# Patient Record
Sex: Female | Born: 1940 | Race: White | Hispanic: No | Marital: Married | Smoking: Never smoker
Health system: Southern US, Community
[De-identification: ages and names within clinical notes are randomized; demographics above are authoritative.]

## PROBLEM LIST (undated history)

## (undated) DIAGNOSIS — G510 Bell's palsy: Secondary | ICD-10-CM

## (undated) DIAGNOSIS — E559 Vitamin D deficiency, unspecified: Secondary | ICD-10-CM

## (undated) DIAGNOSIS — R269 Unspecified abnormalities of gait and mobility: Secondary | ICD-10-CM

## (undated) DIAGNOSIS — Z8679 Personal history of other diseases of the circulatory system: Secondary | ICD-10-CM

## (undated) DIAGNOSIS — R7301 Impaired fasting glucose: Secondary | ICD-10-CM

## (undated) DIAGNOSIS — E538 Deficiency of other specified B group vitamins: Secondary | ICD-10-CM

## (undated) DIAGNOSIS — C9110 Chronic lymphocytic leukemia of B-cell type not having achieved remission: Secondary | ICD-10-CM

## (undated) DIAGNOSIS — I4891 Unspecified atrial fibrillation: Secondary | ICD-10-CM

## (undated) DIAGNOSIS — D801 Nonfamilial hypogammaglobulinemia: Secondary | ICD-10-CM

## (undated) DIAGNOSIS — G600 Hereditary motor and sensory neuropathy: Secondary | ICD-10-CM

## (undated) DIAGNOSIS — K8020 Calculus of gallbladder without cholecystitis without obstruction: Secondary | ICD-10-CM

## (undated) DIAGNOSIS — G35 Multiple sclerosis: Secondary | ICD-10-CM

## (undated) DIAGNOSIS — M152 Bouchard's nodes (with arthropathy): Secondary | ICD-10-CM

## (undated) DIAGNOSIS — G609 Hereditary and idiopathic neuropathy, unspecified: Secondary | ICD-10-CM

## (undated) DIAGNOSIS — W57XXXA Bitten or stung by nonvenomous insect and other nonvenomous arthropods, initial encounter: Secondary | ICD-10-CM

## (undated) HISTORY — DX: Chronic lymphocytic leukemia of B-cell type not having achieved remission: C91.10

## (undated) HISTORY — DX: Personal history of other diseases of the circulatory system: Z86.79

## (undated) HISTORY — DX: Bouchard's nodes (with arthropathy): M15.2

## (undated) HISTORY — DX: Bitten or stung by nonvenomous insect and other nonvenomous arthropods, initial encounter: W57.XXXA

## (undated) HISTORY — PX: CATARACT EXTRACTION W/ INTRAOCULAR LENS  IMPLANT, BILATERAL: SHX1307

## (undated) HISTORY — DX: Nonfamilial hypogammaglobulinemia: D80.1

## (undated) HISTORY — DX: Hereditary and idiopathic neuropathy, unspecified: G60.9

## (undated) HISTORY — DX: Deficiency of other specified B group vitamins: E53.8

## (undated) HISTORY — DX: Calculus of gallbladder without cholecystitis without obstruction: K80.20

## (undated) HISTORY — DX: Unspecified abnormalities of gait and mobility: R26.9

## (undated) HISTORY — DX: Multiple sclerosis: G35

## (undated) HISTORY — PX: BREAST SURGERY: SHX581

## (undated) HISTORY — DX: Bell's palsy: G51.0

## (undated) HISTORY — DX: Vitamin D deficiency, unspecified: E55.9

## (undated) HISTORY — DX: Unspecified atrial fibrillation: I48.91

## (undated) HISTORY — DX: Impaired fasting glucose: R73.01

---

## 1982-01-19 HISTORY — PX: ABDOMINAL HYSTERECTOMY: SUR658

## 1997-11-20 ENCOUNTER — Other Ambulatory Visit: Admission: RE | Admit: 1997-11-20 | Discharge: 1997-11-20 | Payer: Self-pay | Admitting: Internal Medicine

## 1999-07-22 ENCOUNTER — Other Ambulatory Visit: Admission: RE | Admit: 1999-07-22 | Discharge: 1999-07-22 | Payer: Self-pay | Admitting: Internal Medicine

## 1999-08-26 ENCOUNTER — Other Ambulatory Visit: Admission: RE | Admit: 1999-08-26 | Discharge: 1999-08-26 | Payer: Self-pay | Admitting: Internal Medicine

## 2000-04-15 ENCOUNTER — Emergency Department (HOSPITAL_COMMUNITY): Admission: EM | Admit: 2000-04-15 | Discharge: 2000-04-15 | Payer: Self-pay | Admitting: Emergency Medicine

## 2001-01-19 HISTORY — PX: LAPAROSCOPIC BILATERAL SALPINGO OOPHERECTOMY: SHX5890

## 2001-03-28 ENCOUNTER — Encounter: Payer: Self-pay | Admitting: Family Medicine

## 2001-03-28 ENCOUNTER — Ambulatory Visit (HOSPITAL_COMMUNITY): Admission: RE | Admit: 2001-03-28 | Discharge: 2001-03-28 | Payer: Self-pay | Admitting: Family Medicine

## 2001-04-12 ENCOUNTER — Encounter: Payer: Self-pay | Admitting: Family Medicine

## 2001-04-12 ENCOUNTER — Encounter: Admission: RE | Admit: 2001-04-12 | Discharge: 2001-04-12 | Payer: Self-pay | Admitting: Family Medicine

## 2003-08-05 ENCOUNTER — Emergency Department (HOSPITAL_COMMUNITY): Admission: EM | Admit: 2003-08-05 | Discharge: 2003-08-05 | Payer: Self-pay | Admitting: Emergency Medicine

## 2003-09-17 ENCOUNTER — Emergency Department (HOSPITAL_COMMUNITY): Admission: EM | Admit: 2003-09-17 | Discharge: 2003-09-17 | Payer: Self-pay | Admitting: Emergency Medicine

## 2008-02-28 ENCOUNTER — Encounter: Admission: RE | Admit: 2008-02-28 | Discharge: 2008-02-28 | Payer: Self-pay | Admitting: Neurology

## 2009-05-13 ENCOUNTER — Emergency Department (HOSPITAL_COMMUNITY): Admission: EM | Admit: 2009-05-13 | Discharge: 2009-05-13 | Payer: Self-pay | Admitting: Emergency Medicine

## 2010-04-08 LAB — DIFFERENTIAL
Basophils Relative: 1 % (ref 0–1)
Eosinophils Absolute: 0.1 10*3/uL (ref 0.0–0.7)
Eosinophils Relative: 3 % (ref 0–5)
Lymphocytes Relative: 39 % (ref 12–46)
Monocytes Absolute: 0.5 10*3/uL (ref 0.1–1.0)
Monocytes Relative: 9 % (ref 3–12)
Neutro Abs: 2.5 10*3/uL (ref 1.7–7.7)

## 2010-04-08 LAB — URINE CULTURE

## 2010-04-08 LAB — COMPREHENSIVE METABOLIC PANEL
AST: 14 U/L (ref 0–37)
Albumin: 3.6 g/dL (ref 3.5–5.2)
Alkaline Phosphatase: 59 U/L (ref 39–117)
CO2: 26 mEq/L (ref 19–32)
Calcium: 8.8 mg/dL (ref 8.4–10.5)
Creatinine, Ser: 0.71 mg/dL (ref 0.4–1.2)
GFR calc Af Amer: >60 mL/min (ref >60)
GFR calc non Af Amer: >60 mL/min (ref >60)
Potassium: 4 mEq/L (ref 3.5–5.1)

## 2010-04-08 LAB — URINALYSIS, ROUTINE W REFLEX MICROSCOPIC
Hgb urine dipstick: NEGATIVE
Nitrite: NEGATIVE
Specific Gravity, Urine: 1.009 (ref 1.005–1.030)

## 2010-04-08 LAB — URINE MICROSCOPIC-ADD ON

## 2010-04-08 LAB — CBC
RBC: 4.4 MIL/uL (ref 3.87–5.11)
RDW: 13.9 % (ref 11.5–15.5)

## 2011-01-29 DIAGNOSIS — Z1231 Encounter for screening mammogram for malignant neoplasm of breast: Secondary | ICD-10-CM | POA: Diagnosis not present

## 2011-02-04 DIAGNOSIS — R920 Mammographic microcalcification found on diagnostic imaging of breast: Secondary | ICD-10-CM | POA: Diagnosis not present

## 2011-02-11 DIAGNOSIS — G35 Multiple sclerosis: Secondary | ICD-10-CM | POA: Diagnosis not present

## 2011-02-11 DIAGNOSIS — H35039 Hypertensive retinopathy, unspecified eye: Secondary | ICD-10-CM | POA: Diagnosis not present

## 2011-03-16 DIAGNOSIS — Z79899 Other long term (current) drug therapy: Secondary | ICD-10-CM | POA: Diagnosis not present

## 2011-03-16 DIAGNOSIS — C9110 Chronic lymphocytic leukemia of B-cell type not having achieved remission: Secondary | ICD-10-CM | POA: Diagnosis not present

## 2011-03-16 DIAGNOSIS — G5790 Unspecified mononeuropathy of unspecified lower limb: Secondary | ICD-10-CM | POA: Diagnosis not present

## 2011-03-16 DIAGNOSIS — G35 Multiple sclerosis: Secondary | ICD-10-CM | POA: Diagnosis not present

## 2011-03-19 DIAGNOSIS — G609 Hereditary and idiopathic neuropathy, unspecified: Secondary | ICD-10-CM | POA: Diagnosis not present

## 2011-03-23 DIAGNOSIS — I4891 Unspecified atrial fibrillation: Secondary | ICD-10-CM | POA: Diagnosis not present

## 2011-03-26 DIAGNOSIS — R7301 Impaired fasting glucose: Secondary | ICD-10-CM | POA: Diagnosis not present

## 2011-03-26 DIAGNOSIS — M899 Disorder of bone, unspecified: Secondary | ICD-10-CM | POA: Diagnosis not present

## 2011-03-26 DIAGNOSIS — Z1331 Encounter for screening for depression: Secondary | ICD-10-CM | POA: Diagnosis not present

## 2011-03-26 DIAGNOSIS — I4891 Unspecified atrial fibrillation: Secondary | ICD-10-CM | POA: Diagnosis not present

## 2011-03-26 DIAGNOSIS — M949 Disorder of cartilage, unspecified: Secondary | ICD-10-CM | POA: Diagnosis not present

## 2011-03-26 DIAGNOSIS — Z7901 Long term (current) use of anticoagulants: Secondary | ICD-10-CM | POA: Diagnosis not present

## 2011-03-30 DIAGNOSIS — Z7901 Long term (current) use of anticoagulants: Secondary | ICD-10-CM | POA: Diagnosis not present

## 2011-04-01 DIAGNOSIS — Z7901 Long term (current) use of anticoagulants: Secondary | ICD-10-CM | POA: Diagnosis not present

## 2011-04-03 DIAGNOSIS — Z7901 Long term (current) use of anticoagulants: Secondary | ICD-10-CM | POA: Diagnosis not present

## 2011-04-06 DIAGNOSIS — Z7901 Long term (current) use of anticoagulants: Secondary | ICD-10-CM | POA: Diagnosis not present

## 2011-04-13 DIAGNOSIS — Z7901 Long term (current) use of anticoagulants: Secondary | ICD-10-CM | POA: Diagnosis not present

## 2011-04-20 DIAGNOSIS — Z7901 Long term (current) use of anticoagulants: Secondary | ICD-10-CM | POA: Diagnosis not present

## 2011-05-04 DIAGNOSIS — Z7901 Long term (current) use of anticoagulants: Secondary | ICD-10-CM | POA: Diagnosis not present

## 2011-06-01 DIAGNOSIS — Z7901 Long term (current) use of anticoagulants: Secondary | ICD-10-CM | POA: Diagnosis not present

## 2011-06-22 DIAGNOSIS — I4891 Unspecified atrial fibrillation: Secondary | ICD-10-CM | POA: Diagnosis not present

## 2011-06-29 DIAGNOSIS — Z7901 Long term (current) use of anticoagulants: Secondary | ICD-10-CM | POA: Diagnosis not present

## 2011-07-21 DIAGNOSIS — I4891 Unspecified atrial fibrillation: Secondary | ICD-10-CM | POA: Diagnosis not present

## 2011-07-27 DIAGNOSIS — Z7901 Long term (current) use of anticoagulants: Secondary | ICD-10-CM | POA: Diagnosis not present

## 2011-08-13 DIAGNOSIS — R920 Mammographic microcalcification found on diagnostic imaging of breast: Secondary | ICD-10-CM | POA: Diagnosis not present

## 2011-08-13 DIAGNOSIS — Z09 Encounter for follow-up examination after completed treatment for conditions other than malignant neoplasm: Secondary | ICD-10-CM | POA: Diagnosis not present

## 2011-08-24 DIAGNOSIS — Z7901 Long term (current) use of anticoagulants: Secondary | ICD-10-CM | POA: Diagnosis not present

## 2011-09-14 DIAGNOSIS — E785 Hyperlipidemia, unspecified: Secondary | ICD-10-CM | POA: Diagnosis not present

## 2011-09-14 DIAGNOSIS — C9110 Chronic lymphocytic leukemia of B-cell type not having achieved remission: Secondary | ICD-10-CM | POA: Diagnosis not present

## 2011-09-14 DIAGNOSIS — Z79899 Other long term (current) drug therapy: Secondary | ICD-10-CM | POA: Diagnosis not present

## 2011-09-14 DIAGNOSIS — M899 Disorder of bone, unspecified: Secondary | ICD-10-CM | POA: Diagnosis not present

## 2011-09-14 DIAGNOSIS — G609 Hereditary and idiopathic neuropathy, unspecified: Secondary | ICD-10-CM | POA: Diagnosis not present

## 2011-09-14 DIAGNOSIS — I4891 Unspecified atrial fibrillation: Secondary | ICD-10-CM | POA: Diagnosis not present

## 2011-09-14 DIAGNOSIS — Z9079 Acquired absence of other genital organ(s): Secondary | ICD-10-CM | POA: Diagnosis not present

## 2011-09-16 DIAGNOSIS — R269 Unspecified abnormalities of gait and mobility: Secondary | ICD-10-CM | POA: Diagnosis not present

## 2011-09-16 DIAGNOSIS — G609 Hereditary and idiopathic neuropathy, unspecified: Secondary | ICD-10-CM | POA: Diagnosis not present

## 2011-09-22 DIAGNOSIS — Z7901 Long term (current) use of anticoagulants: Secondary | ICD-10-CM | POA: Diagnosis not present

## 2011-09-28 ENCOUNTER — Ambulatory Visit: Payer: Medicare Other | Attending: Neurology | Admitting: Physical Therapy

## 2011-09-28 DIAGNOSIS — M6281 Muscle weakness (generalized): Secondary | ICD-10-CM | POA: Insufficient documentation

## 2011-09-28 DIAGNOSIS — IMO0001 Reserved for inherently not codable concepts without codable children: Secondary | ICD-10-CM | POA: Insufficient documentation

## 2011-09-28 DIAGNOSIS — R269 Unspecified abnormalities of gait and mobility: Secondary | ICD-10-CM | POA: Diagnosis not present

## 2011-10-07 ENCOUNTER — Encounter: Payer: PRIVATE HEALTH INSURANCE | Admitting: Physical Therapy

## 2011-10-08 ENCOUNTER — Encounter: Payer: PRIVATE HEALTH INSURANCE | Admitting: Physical Therapy

## 2011-10-12 ENCOUNTER — Ambulatory Visit: Payer: Medicare Other | Admitting: Physical Therapy

## 2011-10-14 ENCOUNTER — Ambulatory Visit: Payer: Medicare Other | Admitting: Physical Therapy

## 2011-10-19 ENCOUNTER — Ambulatory Visit: Payer: Medicare Other | Admitting: Physical Therapy

## 2011-10-20 DIAGNOSIS — Z23 Encounter for immunization: Secondary | ICD-10-CM | POA: Diagnosis not present

## 2011-10-20 DIAGNOSIS — Z7901 Long term (current) use of anticoagulants: Secondary | ICD-10-CM | POA: Diagnosis not present

## 2011-10-21 ENCOUNTER — Ambulatory Visit: Payer: Medicare Other | Attending: Neurology | Admitting: Physical Therapy

## 2011-10-21 DIAGNOSIS — M6281 Muscle weakness (generalized): Secondary | ICD-10-CM | POA: Diagnosis not present

## 2011-10-21 DIAGNOSIS — IMO0001 Reserved for inherently not codable concepts without codable children: Secondary | ICD-10-CM | POA: Diagnosis not present

## 2011-10-21 DIAGNOSIS — R269 Unspecified abnormalities of gait and mobility: Secondary | ICD-10-CM | POA: Diagnosis not present

## 2011-10-26 ENCOUNTER — Ambulatory Visit: Payer: Medicare Other | Admitting: Physical Therapy

## 2011-10-26 DIAGNOSIS — IMO0001 Reserved for inherently not codable concepts without codable children: Secondary | ICD-10-CM | POA: Diagnosis not present

## 2011-10-26 DIAGNOSIS — M6281 Muscle weakness (generalized): Secondary | ICD-10-CM | POA: Diagnosis not present

## 2011-10-26 DIAGNOSIS — R269 Unspecified abnormalities of gait and mobility: Secondary | ICD-10-CM | POA: Diagnosis not present

## 2011-10-28 ENCOUNTER — Ambulatory Visit: Payer: Medicare Other | Admitting: Physical Therapy

## 2011-10-28 DIAGNOSIS — M6281 Muscle weakness (generalized): Secondary | ICD-10-CM | POA: Diagnosis not present

## 2011-10-28 DIAGNOSIS — R269 Unspecified abnormalities of gait and mobility: Secondary | ICD-10-CM | POA: Diagnosis not present

## 2011-10-28 DIAGNOSIS — IMO0001 Reserved for inherently not codable concepts without codable children: Secondary | ICD-10-CM | POA: Diagnosis not present

## 2011-11-02 ENCOUNTER — Ambulatory Visit: Payer: Medicare Other | Admitting: Physical Therapy

## 2011-11-02 DIAGNOSIS — IMO0001 Reserved for inherently not codable concepts without codable children: Secondary | ICD-10-CM | POA: Diagnosis not present

## 2011-11-02 DIAGNOSIS — R269 Unspecified abnormalities of gait and mobility: Secondary | ICD-10-CM | POA: Diagnosis not present

## 2011-11-02 DIAGNOSIS — M6281 Muscle weakness (generalized): Secondary | ICD-10-CM | POA: Diagnosis not present

## 2011-11-06 ENCOUNTER — Ambulatory Visit: Payer: Medicare Other | Admitting: Physical Therapy

## 2011-11-06 DIAGNOSIS — M6281 Muscle weakness (generalized): Secondary | ICD-10-CM | POA: Diagnosis not present

## 2011-11-06 DIAGNOSIS — R269 Unspecified abnormalities of gait and mobility: Secondary | ICD-10-CM | POA: Diagnosis not present

## 2011-11-06 DIAGNOSIS — IMO0001 Reserved for inherently not codable concepts without codable children: Secondary | ICD-10-CM | POA: Diagnosis not present

## 2011-11-10 ENCOUNTER — Ambulatory Visit: Payer: PRIVATE HEALTH INSURANCE | Admitting: Physical Therapy

## 2011-11-12 ENCOUNTER — Ambulatory Visit: Payer: PRIVATE HEALTH INSURANCE | Admitting: Physical Therapy

## 2011-11-16 ENCOUNTER — Ambulatory Visit: Payer: PRIVATE HEALTH INSURANCE | Admitting: Physical Therapy

## 2011-11-17 DIAGNOSIS — Z7901 Long term (current) use of anticoagulants: Secondary | ICD-10-CM | POA: Diagnosis not present

## 2011-11-20 ENCOUNTER — Ambulatory Visit: Payer: PRIVATE HEALTH INSURANCE | Admitting: Physical Therapy

## 2011-12-03 DIAGNOSIS — R509 Fever, unspecified: Secondary | ICD-10-CM | POA: Diagnosis not present

## 2011-12-03 DIAGNOSIS — J069 Acute upper respiratory infection, unspecified: Secondary | ICD-10-CM | POA: Diagnosis not present

## 2011-12-14 DIAGNOSIS — Z7901 Long term (current) use of anticoagulants: Secondary | ICD-10-CM | POA: Diagnosis not present

## 2011-12-24 DIAGNOSIS — Z7901 Long term (current) use of anticoagulants: Secondary | ICD-10-CM | POA: Diagnosis not present

## 2011-12-24 LAB — PROTIME-INR

## 2011-12-28 DIAGNOSIS — I4891 Unspecified atrial fibrillation: Secondary | ICD-10-CM | POA: Diagnosis not present

## 2012-01-21 DIAGNOSIS — Z7901 Long term (current) use of anticoagulants: Secondary | ICD-10-CM | POA: Diagnosis not present

## 2012-01-28 DIAGNOSIS — L02219 Cutaneous abscess of trunk, unspecified: Secondary | ICD-10-CM | POA: Diagnosis not present

## 2012-01-29 DIAGNOSIS — I4891 Unspecified atrial fibrillation: Secondary | ICD-10-CM | POA: Diagnosis not present

## 2012-02-02 DIAGNOSIS — Z7901 Long term (current) use of anticoagulants: Secondary | ICD-10-CM | POA: Diagnosis not present

## 2012-02-10 ENCOUNTER — Encounter: Payer: Self-pay | Admitting: Pharmacist Clinician (PhC)/ Clinical Pharmacy Specialist

## 2012-03-14 DIAGNOSIS — C9110 Chronic lymphocytic leukemia of B-cell type not having achieved remission: Secondary | ICD-10-CM | POA: Diagnosis not present

## 2012-03-14 DIAGNOSIS — I4891 Unspecified atrial fibrillation: Secondary | ICD-10-CM | POA: Diagnosis not present

## 2012-03-14 DIAGNOSIS — G609 Hereditary and idiopathic neuropathy, unspecified: Secondary | ICD-10-CM | POA: Diagnosis not present

## 2012-03-23 DIAGNOSIS — G609 Hereditary and idiopathic neuropathy, unspecified: Secondary | ICD-10-CM | POA: Diagnosis not present

## 2012-03-23 DIAGNOSIS — R269 Unspecified abnormalities of gait and mobility: Secondary | ICD-10-CM | POA: Diagnosis not present

## 2012-03-24 DIAGNOSIS — R920 Mammographic microcalcification found on diagnostic imaging of breast: Secondary | ICD-10-CM | POA: Diagnosis not present

## 2012-04-05 DIAGNOSIS — Z961 Presence of intraocular lens: Secondary | ICD-10-CM | POA: Diagnosis not present

## 2012-04-05 DIAGNOSIS — Z9849 Cataract extraction status, unspecified eye: Secondary | ICD-10-CM | POA: Diagnosis not present

## 2012-04-07 DIAGNOSIS — I4891 Unspecified atrial fibrillation: Secondary | ICD-10-CM | POA: Diagnosis not present

## 2012-09-22 ENCOUNTER — Encounter: Payer: Self-pay | Admitting: Neurology

## 2012-09-23 ENCOUNTER — Encounter: Payer: Self-pay | Admitting: Neurology

## 2012-09-23 ENCOUNTER — Ambulatory Visit (INDEPENDENT_AMBULATORY_CARE_PROVIDER_SITE_OTHER): Payer: Medicare Other | Admitting: Neurology

## 2012-09-23 VITALS — BP 120/61 | HR 71 | Ht 66.5 in | Wt 196.0 lb

## 2012-09-23 DIAGNOSIS — E559 Vitamin D deficiency, unspecified: Secondary | ICD-10-CM | POA: Diagnosis not present

## 2012-09-23 DIAGNOSIS — R269 Unspecified abnormalities of gait and mobility: Secondary | ICD-10-CM | POA: Insufficient documentation

## 2012-09-23 DIAGNOSIS — G35 Multiple sclerosis: Secondary | ICD-10-CM | POA: Diagnosis not present

## 2012-09-23 DIAGNOSIS — E538 Deficiency of other specified B group vitamins: Secondary | ICD-10-CM | POA: Diagnosis not present

## 2012-09-23 DIAGNOSIS — G609 Hereditary and idiopathic neuropathy, unspecified: Secondary | ICD-10-CM | POA: Diagnosis not present

## 2012-09-23 NOTE — Progress Notes (Signed)
History of Present Illness:   Mrs. Tasha [name] is a 72 year old right-handed white married female with a history of right-sided weakness beginning in 1964. She was a patient of Dr. Sandria Manly.  She was seen at [facility], was diagnosed as multiple sclerosis since 1964, with right hemiparesthesia, mild weakness,  lasting for 2 months. Dr. Sandria Manly began to see her in 1977, and agreed with that diagnosis.  She has intermittent neurologic symptoms and signs since that time including occipital headaches, numbness in the face, Bell's palsy, unsteadiness with gait and a tendency to drag her right leg. She had a spinal fluid examination for MS which was negative.   MRI study of the brain 06/03/1998 showed no evidence of MS.  Evaluation by Dr. Lissa Morales 09/21/2003 for right seventh nerve weakness was thought to be a right Bell's palsy.   Lab evaluations in1/27/2010 including lupus anticoagulant, anticardiolipin antibodies, ANA, angiotensin-converting enzyme, B12, Lyme titer,and methylmalonic acid were normal.   MRIs of the cervical spine and brain in 02/28/2008 were unremarkable except for  tiny nonspecific subcortical white matter intensities.   EMG and nerve conduction study in 06/05/1998 was negative.  She has recurrent numbness on the right side and top of her head to her neck, going around her mouth.  This will resolve after hours she states she has recurrent attacks of her illness every 13 years since 1964.   She went 18 years until September 2008 when she had  imbalance symptoms that  lasted until April 2011. She has vague numbness in her occiput when she bends forward. This occurs in the  right occipital region. She notices that she drags her legs.She has fatigue and has to sit down. She has numbness in her feet extending to her knees. Her mother walked like the patient does. Her mother had pes cavus.   EMG/NCV in 05/30/2009 showed mild to moderate peripheral neuropathy. EMG of right leg showed distal chronic denervation,  there is also evidence of lumbosacral paraspinal muscles denervation. There is also evidence of chronic distal left leg denervation  .Serum methylmalonic acid, 24-hour urine for heavy metals, RA factor, ACE, sedimentation rate, TSH, and SEP were normal. Vitamin D level was low, B12 was 164, and  ANA was positive. She's been on supplemental vitamin D and B 12. serum B12 level folate, were normal in 10/03/2009. She has no change in symptomatology.  In  05/2010,  she began to have increased gait difficulty with her left leg,  it was felt this was a compensation of left knee pain because of right  sided weakness. She has episodes of rapid heartbeat lasting as long as 30 minutes thought to represent atrial fibrillation, but Dr. Royann Shivers monitored her for 4 weeks and thinks she has atrial tachycardia. Warfarin was discontinued due to sinus tachycardia, not afib.  she is on 2 aspirin per day.She uses a cane and bilateral leg braces with improvement in knee pain.   She is  followed by Dr. Kristine Royal at [facility] for chronic lymphocytic leukemia. She denies bowel or bladder incontinence.  She occasionally has difficulty swallowing solids. She notices discomfort of her feet in her shoes.   She is using toe off braces. She uses a cane outside of her house. Her balance is worse and she has more numbness in her left foot.  She improved her balance with physical therapy   UPDATE Sep 2014: She has progressive gait diffculty since 2009, wear braces since 2013. She complains of left knee pain with prolonged standing.  She also complains mild weakness of her right hand.    She denies incontinence.  Review of Systems  Out of a complete 14 system review, the patient complains of only the following symptoms, and all other reviewed systems are negative.  Constitutional: Fatigue   Hematology/Lymphatic: Easy bruising   Neurological: Numbness   Weakness   Sleep: Insomnia   Allergy/Immunology: Allergies, occasional  incontinence.  Comments:  Colonoscopy not done Flu Vaccine 2013 Pneumonia Vaccine 2006 or 2007  Social History  Married, has 2 daughters 68 and 52 and a son 77 living and well.  Occasionally consumes caffeine. Never used tobacco, alcohol or drugs. She retired in 2000.   Family History  Mother died in a nursing home 10/09/2011 and had  a neurologic disorder with peripheral neuropathy and pes cavus. Father died at 43 with heart attack. One brother died of heart disease. Her father and brother have diabetes mellitus. Brother and father had thyroid disease. Brother and father had high cholesterol.Father mother sister and brother have high blood pressure. Her mother had a  a stroke in 2008.  Past Medical History  Hyperlipidemia.  Chronic lymphocytic leukemia since 2001.   Bilateral breast Biopsies in 1970 and 1980s for fibrocystic disease.  Hysterectomy in 1984. Salpingo-oophorectomy 2003.   MS in 1964.  Atrial fibrillation April 2009 now thought to represent atrial tachycardia instead.  Cataract surgery bilaterally in 1996 and 1997.  Surgical History   hysterectomy in 1984. status post cataract surgery.     Physical Exam  General: well-developed white female.   Neurologic Exam  Mental Status: alert and oriented, gave detailed history, no aphasia, no dysarthria. Cranial Nerves:  She is status post cataract surgery bilaterally. No Marcus Gunn pupil. No pallor to the optic discs.Visual acuity 20/25 bilaterally with glasses Extraocular movements were full. Negative red lens testing. Face were symmetric,  Tongue midline, uvula midline, and gags present. sternocleidomastoid and trapezius testing normal  Motor:  5/5 in the upper extremities. Bilateral hip flexion (R/L) 4+/5, ankle dorsiflexion 3+/4, plantar flexion 4/4+,  Sensory:  Decreased vibration, touch ,and pinprick right leg greater than left. Joint position intact.  Coordination:  Normal finger to nose and  heel to shin.   Gait and  Station: need to push up on chair arm to get up from seated position, Trendelenburg gait, cautious, dragging right foot more than left.   Reflexes:   absent deep tendon reflexes in the  lower extremities. 2+ in the upper extremities.    Assessment and Plan: 72 yo RH WF with a history of progressive gait difficulty, carries a potential diagnosis of multiple sclerosis, but with no significant MRI abnormalities, CSF study was normal.   1. Diagnosis remains unclear, complete evaluation with MRI of brain, spine, including MRI lumbar. 2. RTC in one month, if there is no clear etiology found, may consider repeat EMG/NCS

## 2012-10-17 ENCOUNTER — Telehealth: Payer: Self-pay | Admitting: Neurology

## 2012-10-20 NOTE — Telephone Encounter (Signed)
I spoke to patient and she does not want to have an MRI also doesn't want a NCV.  She doesn't think any more testing is going to change anything.  Unless she gets worse she would like to be seen every 6 months.  I have changed her appointment from November to February.

## 2012-10-22 DIAGNOSIS — Z23 Encounter for immunization: Secondary | ICD-10-CM | POA: Diagnosis not present

## 2012-11-21 ENCOUNTER — Ambulatory Visit: Payer: Medicare Other | Admitting: Neurology

## 2013-02-20 ENCOUNTER — Encounter (INDEPENDENT_AMBULATORY_CARE_PROVIDER_SITE_OTHER): Payer: Self-pay

## 2013-02-20 ENCOUNTER — Ambulatory Visit (INDEPENDENT_AMBULATORY_CARE_PROVIDER_SITE_OTHER): Payer: Medicare Other | Admitting: Neurology

## 2013-02-20 ENCOUNTER — Encounter: Payer: Self-pay | Admitting: Neurology

## 2013-02-20 VITALS — BP 124/67 | HR 85 | Ht 66.0 in | Wt 201.0 lb

## 2013-02-20 DIAGNOSIS — G609 Hereditary and idiopathic neuropathy, unspecified: Secondary | ICD-10-CM | POA: Diagnosis not present

## 2013-02-20 DIAGNOSIS — R269 Unspecified abnormalities of gait and mobility: Secondary | ICD-10-CM | POA: Diagnosis not present

## 2013-02-20 DIAGNOSIS — E538 Deficiency of other specified B group vitamins: Secondary | ICD-10-CM

## 2013-02-20 NOTE — Progress Notes (Signed)
History of Present Illness:   Tasha [name] is a 73 year old right-handed white married female with a history of right-sided weakness beginning in 1964. She was a patient of Dr. Erling Cruz.  She was seen at [facility], was diagnosed as multiple sclerosis since 1964, with right hemiparesthesia, mild weakness,  lasting for 2 months. Dr. Erling Cruz began to see her in 1977, and agreed with that diagnosis.  She has intermittent neurologic symptoms and signs since that time including occipital headaches, numbness in the face, Bell's palsy, unsteadiness with gait and a tendency to drag her right leg. She had a spinal fluid examination for MS which was negative.   MRI study of the brain 06/03/1998 showed no evidence of MS.  Evaluation by Dr. Ron Parker 09/21/2003 for right seventh nerve weakness was thought to be a right Bell's palsy.   Lab evaluations in1/27/2010 including lupus anticoagulant, anticardiolipin antibodies, ANA, angiotensin-converting enzyme, B12, Lyme titer,and methylmalonic acid were normal.   MRIs of the cervical spine and brain in 02/28/2008 were unremarkable except for  tiny nonspecific subcortical white matter intensities.   EMG and nerve conduction study in 06/05/1998 was negative.  She has recurrent numbness on the right side and top of her head to her neck, going around her mouth.  This will resolve after hours she states she has recurrent attacks of her illness every 13 years since 1964.   She went 18 years until September 2008 when she had  imbalance symptoms that  lasted until April 2011. She has vague numbness in her occiput when she bends forward. This occurs in the  right occipital region. She notices that she drags her legs.She has fatigue and has to sit down. She has numbness in her feet extending to her knees. Her mother walked like the patient does. Her mother had pes cavus.   EMG/NCV in 05/30/2009 showed mild to moderate peripheral neuropathy. EMG of right leg showed distal chronic denervation,  there is also evidence of lumbosacral paraspinal muscles denervation. There is also evidence of chronic distal left leg denervation  Serum methylmalonic acid, 24-hour urine for heavy metals, RA factor, ACE, sedimentation rate, TSH, and SEP were normal. Vitamin D level was low, B12 was 164, and  ANA was positive. She's been on supplemental vitamin D and B 12. serum B12 level folate, were normal in 10/03/2009. She has no change in symptomatology.  In  05/2010,  she began to have increased gait difficulty with her left leg,  it was felt this was a compensation of left knee pain because of right  sided weakness. She has episodes of rapid heartbeat lasting as long as 30 minutes thought to represent atrial fibrillation, but Dr. Sallyanne Kuster monitored her for 4 weeks and thinks she has atrial tachycardia. Warfarin was discontinued due to sinus tachycardia, not afib.  she is on 2 aspirin per day.She uses a cane and bilateral leg braces with improvement in knee pain.   She is  followed by Dr. Trilby Drummer at [facility] for chronic lymphocytic leukemia. She denies bowel or bladder incontinence.  She occasionally has difficulty swallowing solids. She notices discomfort of her feet in her shoes.   She is using toe off braces. She uses a cane outside of her house. Her balance is worse and she has more numbness in her left foot.  She improved her balance with physical therapy   UPDATE Sep 2014: She has progressive gait diffculty since 2009, wear braces since 2013. She complains of left knee pain with prolonged standing.  She also complains mild weakness of her right hand.    UPDATE Feb 2015:  In ASN,0539,  She has numbness on right side of her body, lasting for 2 weeks,  she was tested at [facility], including CSF, was diagnosed with possible MS. she has never received any disease modification therapy, MRI of the brain in 2000, again in 2010 showed no significant abnormality.  MRI cervical spine showed no intrinsic cord lesion  either   In 1977, she has right side weakness, right face, hand, leg, lasting for few weeks recovered  She began to dragging her right leg more since 2009, it is harder for her to walk.  She was given right ankle brace,    She is still very independent, driving with out difficulty, no bladder and bowel incontinence  Mother had trouble walking since age 12, very similar to her gait difficulty  Her daughter has vulvar cancer, s/p surgery, chemo/radiation.  Her brother had Polio had polio when she was 8, she and her mother were taking care of him,  She had no weakness then.  She complains of fatigue easily.   Review of Systems   Gait difficulty, fatigue, joint pain, numbness, weakness   Comments:  Colonoscopy not done Flu Vaccine 2013 Pneumonia Vaccine 2006 or 2007  Social History  Married, has 2 daughters 81 and 36 and a son 32 living and well.  Occasionally consumes caffeine. Never used tobacco, alcohol or drugs. She retired in 2000.   Family History  Mother died in a nursing home 2011-10-31 and had  a neurologic disorder with peripheral neuropathy and pes cavus. Father died at 67 with heart attack. One brother died of heart disease. Her father and brother have diabetes mellitus. Brother and father had thyroid disease. Brother and father had high cholesterol.Father mother sister and brother have high blood pressure. Her mother had a  a stroke in 2008.  Past Medical History  Hyperlipidemia.  Chronic lymphocytic leukemia since 2001.   Bilateral breast Biopsies in 1970 and 1980s for fibrocystic disease.  Hysterectomy in 1984. Salpingo-oophorectomy 2003.   MS in 1964.  Atrial fibrillation April 2009 now thought to represent atrial tachycardia instead.  Cataract surgery bilaterally in 1996 and 1997.  Surgical History   hysterectomy in 1984. status post cataract surgery.     PHYSICAL EXAMINATOINS:  Generalized: In no acute distress  Neck: Supple, no carotid bruits    Cardiac: Regular rate rhythm  Pulmonary: Clear to auscultation bilaterally  Musculoskeletal: No deformity  Neurological examination  Mentation: Alert oriented to time, place, history taking, and causual conversation  Cranial nerve II-XII: Pupils were equal round reactive to light extraocular movements were full, visual field were full on confrontational test.  Bilateral fundi were sharp  Facial sensation and strength were normal. hearing was intact to finger rubbing bilaterally. Uvula tongue midline.  head turning and shoulder shrug and were normal and symmetric.Tongue protrusion into cheek strength was normal.  Motor: Ankle dorsiflexion  (r/L), 4 minus/4 ankle plantar flexion 4 minus/4,,  Sensory: Intact to fine touch, pinprick, preserved vibratory sensation, and proprioception at toes.  Coordination: Normal finger to nose, heel-to-shin bilaterally there was no truncal ataxia  Gait: she has bilateral foot drop, right worse than left.  Deep tendon reflexes: Brachioradialis 0/0, biceps 0/0, triceps 0/0, patellar 0/0, Achilles absent, plantar responses were flexor bilaterally.  Assessment and Plan: 73 yo RH WF with a history of progressive gait difficulty, carries a potential diagnosis of multiple sclerosis, but with no  significant MRI brain and cervical abnormalities, CSF study was normal. On examinations, she has distal weakness, areflexia,  1. Need to rule out MRI lumbar stenosis. Possibility also including peripheral neuropathy  hereditary vs. acquired she does not want further evaluation at this point,  2. RTC in 6 months

## 2013-02-21 LAB — LYME, TOTAL AB TEST/REFLEX

## 2013-03-09 ENCOUNTER — Ambulatory Visit
Admission: RE | Admit: 2013-03-09 | Discharge: 2013-03-09 | Disposition: A | Discharge status: Home / Self Care | Payer: Medicare Other | Source: Ambulatory Visit | Attending: Neurology | Admitting: Neurology

## 2013-03-09 DIAGNOSIS — G609 Hereditary and idiopathic neuropathy, unspecified: Secondary | ICD-10-CM

## 2013-03-09 DIAGNOSIS — R269 Unspecified abnormalities of gait and mobility: Secondary | ICD-10-CM | POA: Diagnosis not present

## 2013-03-09 DIAGNOSIS — E538 Deficiency of other specified B group vitamins: Secondary | ICD-10-CM

## 2013-03-13 DIAGNOSIS — I4891 Unspecified atrial fibrillation: Secondary | ICD-10-CM | POA: Diagnosis not present

## 2013-03-13 DIAGNOSIS — Z9849 Cataract extraction status, unspecified eye: Secondary | ICD-10-CM | POA: Diagnosis not present

## 2013-03-13 DIAGNOSIS — G35 Multiple sclerosis: Secondary | ICD-10-CM | POA: Diagnosis not present

## 2013-03-13 DIAGNOSIS — N6019 Diffuse cystic mastopathy of unspecified breast: Secondary | ICD-10-CM | POA: Diagnosis not present

## 2013-03-13 DIAGNOSIS — E785 Hyperlipidemia, unspecified: Secondary | ICD-10-CM | POA: Diagnosis not present

## 2013-03-13 DIAGNOSIS — C9110 Chronic lymphocytic leukemia of B-cell type not having achieved remission: Secondary | ICD-10-CM | POA: Diagnosis not present

## 2013-03-13 DIAGNOSIS — M899 Disorder of bone, unspecified: Secondary | ICD-10-CM | POA: Diagnosis not present

## 2013-03-13 DIAGNOSIS — G609 Hereditary and idiopathic neuropathy, unspecified: Secondary | ICD-10-CM | POA: Diagnosis not present

## 2013-03-13 DIAGNOSIS — Z79899 Other long term (current) drug therapy: Secondary | ICD-10-CM | POA: Diagnosis not present

## 2013-03-13 DIAGNOSIS — M949 Disorder of cartilage, unspecified: Secondary | ICD-10-CM | POA: Diagnosis not present

## 2013-03-13 DIAGNOSIS — Z90710 Acquired absence of both cervix and uterus: Secondary | ICD-10-CM | POA: Diagnosis not present

## 2013-03-13 DIAGNOSIS — G510 Bell's palsy: Secondary | ICD-10-CM | POA: Diagnosis not present

## 2013-03-13 DIAGNOSIS — Z5181 Encounter for therapeutic drug level monitoring: Secondary | ICD-10-CM | POA: Diagnosis not present

## 2013-03-15 ENCOUNTER — Telehealth: Payer: Self-pay | Admitting: Neurology

## 2013-03-15 NOTE — Telephone Encounter (Signed)
Tasha [name] Please call patient: mild lumbar degenerative disc disease, no significant stenosis.    Abnormal MRI lumbar spine show mild disc and facet degenerative changes resulting in mild bilateral foraminal narrowing at L3-4 and L4-5 but without significant compression.

## 2013-03-17 NOTE — Telephone Encounter (Signed)
Spoke to patient and relayed mild degenerative disc disease, no significant stenosis, per Dr. Krista Blue.

## 2013-03-23 ENCOUNTER — Ambulatory Visit (INDEPENDENT_AMBULATORY_CARE_PROVIDER_SITE_OTHER): Payer: Medicare Other | Admitting: Cardiovascular Disease

## 2013-03-23 ENCOUNTER — Encounter: Payer: Self-pay | Admitting: Cardiovascular Disease

## 2013-03-23 VITALS — BP 120/66 | HR 79 | Resp 16 | Ht 67.0 in | Wt 200.1 lb

## 2013-03-23 DIAGNOSIS — I471 Supraventricular tachycardia: Secondary | ICD-10-CM | POA: Insufficient documentation

## 2013-03-23 DIAGNOSIS — I498 Other specified cardiac arrhythmias: Secondary | ICD-10-CM

## 2013-03-23 DIAGNOSIS — I499 Cardiac arrhythmia, unspecified: Secondary | ICD-10-CM

## 2013-03-23 NOTE — Patient Instructions (Signed)
Your physician recommends that you schedule a follow-up appointment as needed  

## 2013-03-23 NOTE — Assessment & Plan Note (Signed)
Tasha [name] has a history of infrequent and brief paroxysmal atrial tachycardia that has responded remarkably well to low-dose beta blocker therapy. She is now asymptomatic. I recommended that she continue taking metoprolol and aspirin. She will followup with [place] on an as-needed basis, and her primary care physician we will refill her metoprolol prescriptions.

## 2013-03-23 NOTE — Progress Notes (Signed)
Patient ID: Tasha [name], female   DOB: 1940/09/21, 73 y.o.   MRN: [id number]      Reason for office visit Followup atrial tachycardia  Tasha [name] is now 73 years old and has a history of occasional brief episodes of atrial tachycardia. These have been minimally symptomatic since he started treatment with metoprolol. She does not have a history of stroke or other embolic events. A very questionable TIA occurred in the year 2000 and was likely not a true ischemic cerebral event. She is on aspirin and a very low dose of metoprolol. She does not have a history of hypertension, hyperlipidemia, diabetes and does not smoke. She has no history of coronary disease or other structural heart problems. She does have peripheral neuropathy of uncertain etiology and is wearing braces. For years was told that she had multiple sclerosis but that diagnosis is in doubt since her MRI does not show any typical lesions in her CSF exam is normal. He has long-standing chronic lymphocytic leukemia, the only manifestation of which is mild thrombocytopenia without bleeding complications. She has no complaints today.   Allergies  Allergen Reactions  . Celebrex [Celecoxib] Other (See Comments)    Hypertension, poss TIA  . Statins Other (See Comments)    Muscle aches  . Tricor [Fenofibrate] Other (See Comments)    tricor - elevated liver enzymes    Current Outpatient Prescriptions  Medication Sig Dispense Refill  . aspirin 81 MG tablet Take 81 mg by mouth daily.      . cholecalciferol (VITAMIN D) 400 UNITS TABS Take 800 Units by mouth daily.      . Cyanocobalamin (VITAMIN B 12 PO) Take 1 tablet by mouth daily.      . metoprolol tartrate (LOPRESSOR) 25 MG tablet Take 12.5 mg by mouth 2 (two) times daily.       No current facility-administered medications for this visit.    Past Medical History  Diagnosis Date  . IFG (impaired fasting glucose)     e  . 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 Gallstones   . Atrial  fibrillation     questionable, echo 03/23/11 - EF >55%, on warfarin  . Bell's palsy   . Chronic lymphoid leukemia, without mention of having achieved remission   . Personal history of other diseases of circulatory system   . Vitamin D deficiency   . Vitamin B12 deficiency   . MS (multiple sclerosis)   . Unspecified hereditary and idiopathic peripheral neuropathy   . Gait disturbance     Past Surgical History  Procedure Laterality Date  . Breast surgery  1960s    benign breast tumors  . Abdominal hysterectomy  1984    fibroids  . Laparoscopic bilateral salpingo oopherectomy  2003    ovarian cyst  . Cataract extraction w/ intraocular lens  implant, bilateral      Family History  Problem Relation Age of Onset  . Coronary artery disease Father     died at 36  . Hypertension Father   . Diabetes Father   . Hypertension Mother   . Coronary artery disease Brother     died at 61  . Diabetes Brother   . Hypertension Brother   . Cancer Daughter     History   Social History  . Marital Status: Married    Spouse Name: Colen    Number of Children: 3  . Years of Education: college   Occupational History  .  Retired   Social History Main Topics  . Smoking status: Never Smoker   . Smokeless tobacco: Never Used  . Alcohol Use: No  . Drug Use: No  . Sexual Activity: Not on file   Other Topics Concern  . 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 Patient has college education.    Right handed.   Caffeine- not every day . Soda or tea when she is out for dinner.    Review of systems: The patient specifically denies any chest pain at rest or with exertion, dyspnea at rest or with exertion, orthopnea, paroxysmal nocturnal dyspnea, syncope, palpitations, focal neurological deficits, intermittent claudication, lower extremity edema, unexplained weight gain, cough, hemoptysis or wheezing.  The patient also denies abdominal pain,  nausea, vomiting, dysphagia, diarrhea, constipation, polyuria, polydipsia, dysuria, hematuria, frequency, urgency, abnormal bleeding or bruising, fever, chills, unexpected weight changes, mood swings, change in skin or hair texture, change in voice quality, auditory or visual problems, allergic reactions or rashes, new musculoskeletal complaints other than usual "aches and pains".   PHYSICAL EXAM BP 120/66  Pulse 79  Resp 16  Ht $R'5\' 7"'TA$  (1.702 m)  Wt 90.765 kg (200 lb 1.6 oz)  BMI 31.33 kg/m2  General: Alert, oriented x3, no distress Head: no evidence of trauma, PERRL, EOMI, no exophtalmos or lid lag, no myxedema, no xanthelasma; normal ears, nose and oropharynx Neck: normal jugular venous pulsations and no hepatojugular reflux; brisk carotid pulses without delay and no carotid bruits Chest: clear to auscultation, no signs of consolidation by percussion or palpation, normal fremitus, symmetrical and full respiratory excursions Cardiovascular: normal position and quality of the apical impulse, regular rhythm, normal first and second heart sounds, no murmurs, rubs or gallops Abdomen: no tenderness or distention, no masses by palpation, no abnormal pulsatility or arterial bruits, normal bowel sounds, no hepatosplenomegaly Extremities: no clubbing, cyanosis or edema; 2+ radial, ulnar and brachial pulses bilaterally; 2+ right femoral, posterior tibial and dorsalis pedis pulses; 2+ left femoral, posterior tibial and dorsalis pedis pulses; no subclavian or femoral bruits Neurological: grossly nonfocal   EKG: Normal sinus rhythm with sinus arrhythmia, poor R-wave progression and nonspecific T wave changes unchanged from previous tracings  Lipid Panel  No results found for this basename: chol, trig, hdl, cholhdl, vldl, ldlcalc    BMET    Component Value Date/Time   NA 140 05/13/2009 0834   K 4.0 05/13/2009 0834   CL 110 05/13/2009 0834   CO2 26 05/13/2009 0834   GLUCOSE 111* 05/13/2009 0834   BUN  14 05/13/2009 0834   CREATININE 0.71 05/13/2009 0834   CALCIUM 8.8 05/13/2009 0834   GFRNONAA >60 05/13/2009 0834   GFRAA  Value: >60        The eGFR has been calculated using the MDRD equation. This calculation has not been validated in all clinical situations. eGFR's persistently <60 mL/min signify possible Chronic Kidney Disease. 05/13/2009 0834     ASSESSMENT AND PLAN Atrial tachycardia Mrs. Goyne has a history of infrequent and brief paroxysmal atrial tachycardia that has responded remarkably well to low-dose beta blocker therapy. She is now asymptomatic. I recommended that she continue taking metoprolol and aspirin. She will followup with [place] on an as-needed basis, and her primary care physician we will refill her metoprolol prescriptions.   Orders Placed This Encounter  Procedures  . EKG 12-Lead   No orders of the defined types were placed in this encounter.  Holli Humbles, MD, [place] [phone] office [phone] pager

## 2013-04-07 DIAGNOSIS — G600 Hereditary motor and sensory neuropathy: Secondary | ICD-10-CM | POA: Diagnosis not present

## 2013-05-04 DIAGNOSIS — R7301 Impaired fasting glucose: Secondary | ICD-10-CM | POA: Diagnosis not present

## 2013-05-10 DIAGNOSIS — R928 Other abnormal and inconclusive findings on diagnostic imaging of breast: Secondary | ICD-10-CM | POA: Diagnosis not present

## 2013-08-21 ENCOUNTER — Encounter: Payer: Self-pay | Admitting: Neurology

## 2013-08-21 ENCOUNTER — Ambulatory Visit (INDEPENDENT_AMBULATORY_CARE_PROVIDER_SITE_OTHER): Payer: Medicare Other | Admitting: Neurology

## 2013-08-21 VITALS — BP 126/67 | HR 70 | Ht 65.0 in | Wt 198.0 lb

## 2013-08-21 DIAGNOSIS — R269 Unspecified abnormalities of gait and mobility: Secondary | ICD-10-CM | POA: Diagnosis not present

## 2013-08-21 DIAGNOSIS — E559 Vitamin D deficiency, unspecified: Secondary | ICD-10-CM

## 2013-08-21 DIAGNOSIS — E538 Deficiency of other specified B group vitamins: Secondary | ICD-10-CM

## 2013-08-21 NOTE — Progress Notes (Signed)
History of Present Illness:   Tasha [name] is a 73 -year-old right-handed white married female with a history of right-sided weakness beginning in 1964. She was a patient of Dr. Erling Cruz.  She was seen at [facility], was diagnosed as multiple sclerosis since 1964, with right hemiparesthesia, mild weakness,  lasting for 2 months. Dr. Erling Cruz began to see her in 1977, and agreed with that diagnosis.  She has intermittent neurologic symptoms and signs since that time including occipital headaches, numbness in the face, Bell's palsy, unsteadiness with gait and a tendency to drag her right leg. She had a spinal fluid examination for MS which was negative.   MRI study of the brain 06/03/1998 showed no evidence of MS.  Evaluation by Dr. Ron Parker 09/21/2003 for right seventh nerve weakness was thought to be a right Bell's palsy.   Lab evaluations in1/27/2010 including lupus anticoagulant, anticardiolipin antibodies, ANA, angiotensin-converting enzyme, B12, Lyme titer,and methylmalonic acid were normal.   MRIs of the cervical spine and brain in 02/28/2008,  were unremarkable except for  tiny nonspecific subcortical white matter intensities.   EMG and nerve conduction study in 06/05/1998 was negative.  She has recurrent numbness on the right side and top of her head to her neck, going around her mouth.  This will resolve after hours she states she has recurrent attacks of her illness every 13 years since 1964.   She went 18 years without recurrent symptoms until September 2008 when she had  imbalance symptoms that  lasted until April 2011. She has vague numbness in her occiput when she bends forward. This occurs in the  right occipital region. She notices that she drags her legs.She has fatigue and has to sit down. She has numbness in her feet extending to her knees. Her mother walked like the patient does. Her mother had pes cavus.   EMG/NCV in 05/30/2009 showed mild to moderate peripheral neuropathy. EMG of right leg showed  distal chronic denervation, there is also evidence of lumbosacral paraspinal muscles denervation. There is also evidence of chronic distal left leg denervation  Serum methylmalonic acid, 24-hour urine for heavy metals, RA factor, ACE, sedimentation rate, TSH, and SEP were normal. Vitamin D level was low, B12 was 164, and  ANA was positive. She's been on supplemental vitamin D and B 12. serum B12 level folate, were normal in 10/03/2009. She has no change in symptomatology.  In  05/2010,  she began to have increased gait difficulty with her left leg,  it was felt this was a compensation of left knee pain because of right  sided weakness. She has episodes of rapid heartbeat lasting as long as 30 minutes thought to represent atrial fibrillation, but Dr. Sallyanne Kuster monitored her for 4 weeks and thinks she has atrial tachycardia. Warfarin was discontinued due to sinus tachycardia, not afib.  she is on 2 aspirin per day.She uses a cane and bilateral leg braces with improvement in knee pain.   She is  followed by Dr. Trilby Drummer at [facility] for chronic lymphocytic leukemia. She denies bowel or bladder incontinence.  She occasionally has difficulty swallowing solids. She notices discomfort of her feet in her shoes.   She is using toe off braces. She uses a cane outside of her house. Her balance is worse and she has more numbness in her left foot.  She improved her balance with physical therapy   UPDATE Sep 2014: She has progressive gait diffculty since 2009, wear braces since 2013. She complains of left knee  pain with prolonged standing.  She also complains mild weakness of her right hand.    UPDATE Feb 2015:  In XNA,[id number],  She has numbness on right side of her body, lasting for 2 weeks,  she was tested at [facility], including CSF, was diagnosed with possible MS. she has never received any disease modification therapy, MRI of the brain in 2000, again in 2010 showed no significant abnormality.  MRI cervical spine  showed no intrinsic cord lesion either   In 1977, she has right side weakness, right face, hand, leg, lasting for few weeks recovered  She began to dragging her right leg more since 2009, it is harder for her to walk.  She was given right ankle brace,    She is still very independent, driving with out difficulty, no bladder and bowel incontinence  Mother had trouble walking since age 41, very similar to her gait difficulty  Her daughter has vulvar cancer, s/p surgery, chemo/radiation.  Her brother had Polio had polio when she was 42, she and her mother were taking care of him,  She had no weakness then.  She complains of fatigue easily.   UPDATE August 3rd 2015: She is concerned about the possibility of CMT, her mother has progressive worsening gait difficulty as her mother aging, "similar to me",   also suffered stroke, she has urinary urgency, no incontinence,  She has no pain, she noticed mild right hand weakness, it is hard for her to grasp handle of spoon.  She continues to get mildly worse over the years, after discussion, she does not want to initiate an evaluation including repeat electrodiagnostic study or referral to tertiary center at this point   We have MRI of the lumbar, mild degenerative disc disease, no significant foraminal or canal stenosis  Review of Systems   Gait difficulty, fatigue, joint pain, numbness, weakness   Comments:  Colonoscopy not done Flu Vaccine 2013 Pneumonia Vaccine 2006 or 2007  Social History  Married, has 2 daughters 62 and 56 and a son 1 living and well.  Occasionally consumes caffeine. Never used tobacco, alcohol or drugs. She retired in 2000.   Family History  Mother died in a nursing home Oct 07, 2011 and had  a neurologic disorder with peripheral neuropathy and pes cavus. Father died at 73 with heart attack. One brother died of heart disease. Her father and brother have diabetes mellitus. Brother and father had thyroid disease. Brother and  father had high cholesterol.Father mother sister and brother have high blood pressure. Her mother had a  a stroke in 2008.  Past Medical History  Hyperlipidemia.  Chronic lymphocytic leukemia since 2001.   Bilateral breast Biopsies in 1970 and 1980s for fibrocystic disease.  Hysterectomy in 1984. Salpingo-oophorectomy 2003.   MS in 1964.  Atrial fibrillation April 2009 now thought to represent atrial tachycardia instead.  Cataract surgery bilaterally in 1996 and 1997.  Surgical History   hysterectomy in 1984. status post cataract surgery.     PHYSICAL EXAMINATOINS:  Generalized: In no acute distress  Neck: Supple, no carotid bruits   Cardiac: Regular rate rhythm  Pulmonary: Clear to auscultation bilaterally  Musculoskeletal: No deformity  Neurological examination  Mentation: Alert oriented to time, place, history taking, and causual conversation  Cranial nerve II-XII: Pupils were equal round reactive to light extraocular movements were full, visual field were full on confrontational test.  Bilateral fundi were sharp  Facial sensation and strength were normal. hearing was intact to finger rubbing bilaterally. Uvula  tongue midline.  head turning and shoulder shrug and were normal and symmetric.Tongue protrusion into cheek strength was normal.  Motor: Ankle dorsiflexion  (r/L), 4 minus/4 ankle plantar flexion 4 minus/4,,  Sensory: Intact to fine touch, pinprick, preserved vibratory sensation, and proprioception at toes.  Coordination: Normal finger to nose, heel-to-shin bilaterally there was no truncal ataxia  Gait: Stiff, wide based cautious gait, unsteady  Deep tendon reflexes: Brachioradialis 0/0, biceps 0/0, triceps 0/0, patellar 0/0, Achilles absent, plantar responses were flexor bilaterally.  Assessment and Plan: 73 yo RH WF with a history of progressive gait difficulty, carries a potential diagnosis of multiple sclerosis, but with no significant MRI brain and cervical  abnormalities, CSF study was normal. On examinations, she has distal weakness, areflexia, wide based unsteady gait, I would localize the lesion to peripheral nervous system,  CMT dose seems to be on the differentiation this, I have discussed with patient repeat electrodiagnostic study or tertiary center referral, she decided to hold off further evaluation at this point, overall, she had slow progress in gait difficulty,  RTC In 6 months

## 2013-09-12 DIAGNOSIS — H26499 Other secondary cataract, unspecified eye: Secondary | ICD-10-CM | POA: Diagnosis not present

## 2013-09-12 DIAGNOSIS — H4420 Degenerative myopia, unspecified eye: Secondary | ICD-10-CM | POA: Diagnosis not present

## 2013-09-12 DIAGNOSIS — Z9849 Cataract extraction status, unspecified eye: Secondary | ICD-10-CM | POA: Diagnosis not present

## 2013-09-12 DIAGNOSIS — H35459 Secondary pigmentary degeneration, unspecified eye: Secondary | ICD-10-CM | POA: Diagnosis not present

## 2013-10-30 DIAGNOSIS — Z0000 Encounter for general adult medical examination without abnormal findings: Secondary | ICD-10-CM | POA: Diagnosis not present

## 2013-10-30 DIAGNOSIS — Z1389 Encounter for screening for other disorder: Secondary | ICD-10-CM | POA: Diagnosis not present

## 2013-10-30 DIAGNOSIS — M8580 Other specified disorders of bone density and structure, unspecified site: Secondary | ICD-10-CM | POA: Diagnosis not present

## 2013-10-30 DIAGNOSIS — M949 Disorder of cartilage, unspecified: Secondary | ICD-10-CM | POA: Diagnosis not present

## 2013-10-30 DIAGNOSIS — Z23 Encounter for immunization: Secondary | ICD-10-CM | POA: Diagnosis not present

## 2013-10-30 DIAGNOSIS — I471 Supraventricular tachycardia: Secondary | ICD-10-CM | POA: Diagnosis not present

## 2013-11-03 ENCOUNTER — Other Ambulatory Visit: Payer: Self-pay

## 2014-02-19 ENCOUNTER — Encounter: Payer: Self-pay | Admitting: Neurology

## 2014-02-19 ENCOUNTER — Ambulatory Visit (INDEPENDENT_AMBULATORY_CARE_PROVIDER_SITE_OTHER): Payer: Medicare Other | Admitting: Neurology

## 2014-02-19 VITALS — BP 115/69 | HR 81 | Ht 65.0 in | Wt 196.0 lb

## 2014-02-19 DIAGNOSIS — R269 Unspecified abnormalities of gait and mobility: Secondary | ICD-10-CM

## 2014-02-20 NOTE — Progress Notes (Signed)
History of Present Illness:   Tasha [name] is a 74 -year-old right-handed white married female with a history of right-sided weakness beginning in 1964. Tasha [name] was a patient of Dr. Erling Cruz.  Tasha [name] was seen at [facility], was diagnosed as multiple sclerosis since 1964, with right hemiparesthesia, mild weakness,  lasting for 2 months. Dr. Erling Cruz began to see her in 1977, and agreed with that diagnosis.  Tasha [name] has intermittent neurologic symptoms and signs since that time including occipital headaches, numbness in the face, Bell's palsy, unsteadiness with gait and a tendency to drag her right leg. Tasha [name] had a spinal fluid examination for MS which was negative.   MRI study of the brain 06/03/1998 showed no evidence of MS.  Evaluation by Dr. Ron Parker 09/21/2003 for right seventh nerve weakness was thought to be a right Bell's palsy.   Lab evaluations in1/27/2010 including lupus anticoagulant, anticardiolipin antibodies, ANA, angiotensin-converting enzyme, B12, Lyme titer,and methylmalonic acid were normal.   MRIs of the cervical spine and brain in 02/28/2008,  were unremarkable except for  tiny nonspecific subcortical white matter intensities.   EMG and nerve conduction study in 06/05/1998 was negative.  Tasha [name] has recurrent numbness on the right side and top of her head to her neck, going around her mouth.  This will resolve after hours Tasha [name] states Tasha [name] has recurrent attacks of her illness every 13 years since 1964.   Tasha [name] went 18 years without recurrent symptoms until September 2008 when Tasha [name] had  imbalance symptoms that  lasted until April 2011. Tasha [name] has vague numbness in her occiput when Tasha [name] bends forward. This occurs in the  right occipital region. Tasha [name] notices that Tasha [name] drags her legs.Tasha [name] has fatigue and has to sit down. Tasha [name] has numbness in her feet extending to her knees. Her mother walked like the patient does. Her mother had pes cavus.   EMG/NCV in 05/30/2009 showed mild to moderate peripheral neuropathy. EMG of right leg showed  distal chronic denervation, there is also evidence of lumbosacral paraspinal muscles denervation. There is also evidence of chronic distal left leg denervation  Serum methylmalonic acid, 24-hour urine for heavy metals, RA factor, ACE, sedimentation rate, TSH, and SEP were normal. Vitamin D level was low, B12 was 164, and  ANA was positive. Tasha [name]'s been on supplemental vitamin D and B 12. serum B12 level folate, were normal in 10/03/2009. Tasha [name] has no change in symptomatology.  In  05/2010,  Tasha [name] began to have increased gait difficulty with her left leg,  it was felt this was a compensation of left knee pain because of right  sided weakness. Tasha [name] has episodes of rapid heartbeat lasting as long as 30 minutes thought to represent atrial fibrillation, but Dr. Sallyanne Kuster monitored her for 4 weeks and thinks Tasha [name] has atrial tachycardia. Warfarin was discontinued due to sinus tachycardia, not afib.  Tasha [name] is on 2 aspirin per day.Tasha [name] uses a cane and bilateral leg braces with improvement in knee pain.   Tasha [name] is  followed by Dr. Trilby Drummer at [facility] for chronic lymphocytic leukemia. Tasha [name] denies bowel or bladder incontinence.  Tasha [name] occasionally has difficulty swallowing solids. Tasha [name] notices discomfort of her feet in her shoes.   Tasha [name] is using toe off braces. Tasha [name] uses a cane outside of her house. Her balance is worse and Tasha [name] has more numbness in her left foot.  Tasha [name] improved her balance with physical therapy   UPDATE Sep 2014: Tasha [name] has progressive gait diffculty since 2009, wear braces since 2013. Tasha [name] complains of left knee  pain with prolonged standing.  Tasha [name] also complains mild weakness of her right hand.    UPDATE Feb 2015:  In [id number],[id number],  Tasha [name] has numbness on right side of her body, lasting for 2 weeks,  Tasha [name] was tested at [facility], including CSF, was diagnosed with possible [name] has never received any disease modification therapy, MRI of the brain in 2000, again in 2010 showed no significant abnormality.  MRI cervical spine  showed no intrinsic cord lesion either   In 1977, Tasha [name] has right side weakness, right face, hand, leg, lasting for few weeks recovered  Tasha [name] began to dragging her right leg more since 2009, it is harder for her to walk.  Tasha [name] was given right ankle brace,    Tasha [name] is still very independent, driving with out difficulty, no bladder and bowel incontinence  Mother had trouble walking since age 36, very similar to her gait difficulty  Her daughter has vulvar cancer, s/p surgery, chemo/radiation.  Her brother had Polio had polio when Tasha [name] was 71, Tasha [name] and her mother were taking care of him,  Tasha [name] had no weakness then.  Tasha [name] complains of fatigue easily.   UPDATE August 3rd 2015: Tasha [name] is concerned about the possibility of CMT, her mother has progressive worsening gait difficulty as her mother aging, "similar to me",   also suffered stroke, Tasha [name] has urinary urgency, no incontinence,  Tasha [name] has no pain, Tasha [name] noticed mild right hand weakness, it is hard for her to grasp handle of spoon.  Tasha [name] continues to get mildly worse over the years, after discussion, Tasha [name] does not want to initiate an evaluation including repeat electrodiagnostic study or referral to tertiary center at this point   We have MRI of the lumbar, mild degenerative disc disease, no significant foraminal or canal stenosis  UPDATE Feb 1st 2016: There was no worsening of her symptoms, Tasha [name] continued to have mild gait difficulty, no change,  Review of Systems   Gait difficulty, fatigue, joint pain, numbness, weakness   Comments:  Colonoscopy not done Flu Vaccine 2013 Pneumonia Vaccine 2006 or 2007  Social History  Married, has 2 daughters 41 and 32 and a son 53 living and well.  Occasionally consumes caffeine. Never used tobacco, alcohol or drugs. Tasha [name] retired in 2000.   Family History  Mother died in a nursing home 28-Oct-2011 and had  a neurologic disorder with peripheral neuropathy and pes cavus. Father died at 62 with heart attack. One brother  died of heart disease. Her father and brother have diabetes mellitus. Brother and father had thyroid disease. Brother and father had high cholesterol.Father mother sister and brother have high blood pressure. Her mother had a  a stroke in 2008.  Past Medical History  Hyperlipidemia.  Chronic lymphocytic leukemia since 2001.   Bilateral breast Biopsies in 1970 and 1980s for fibrocystic disease.  Hysterectomy in 1984. Salpingo-oophorectomy 2003.   MS in 1964.  Atrial fibrillation April 2009 now thought to represent atrial tachycardia instead.  Cataract surgery bilaterally in 1996 and 1997.  Surgical History   hysterectomy in 1984. status post cataract surgery.     PHYSICAL EXAMINATOINS:  Generalized: In no acute distress  Neck: Supple, no carotid bruits   Cardiac: Regular rate rhythm  Pulmonary: Clear to auscultation bilaterally  Musculoskeletal: No deformity  Neurological examination  Mentation: Alert oriented to time, place, history taking, and causual conversation  Cranial nerve II-XII: Pupils were equal round reactive to light extraocular movements were full, visual field were full on confrontational  test.  Bilateral fundi were sharp  Facial sensation and strength were normal. hearing was intact to finger rubbing bilaterally. Uvula tongue midline.  head turning and shoulder shrug and were normal and symmetric.Tongue protrusion into cheek strength was normal.  Motor: Tasha [name] has mild distal weakness, Ankle dorsiflexion  (r/L), 4 minus/4 ankle plantar flexion 4 minus/4,,  Sensory: Intact to fine touch, pinprick, preserved vibratory sensation, and proprioception at toes.  Coordination: Normal finger to nose, heel-to-shin bilaterally there was no truncal ataxia  Gait: Stiff, wide based cautious gait, unsteady  Deep tendon reflexes: Brachioradialis 0/0, biceps 0/0, triceps 0/0, patellar 0/0, Achilles absent, plantar responses were flexor bilaterally.  Assessment and Plan: 74 yo  RH WF with a history of progressive gait difficulty, carries a potential diagnosis of multiple sclerosis, but with no significant MRI brain and cervical abnormalities, CSF study was normal. On examinations, Tasha [name] has distal weakness, areflexia, wide based unsteady gait, I would localize the lesion to peripheral nervous system,  Differentiation diagnosis including hereditary, versus required  Peripheral neuropathy RTC In 6 months  No orders of the defined types were placed in this encounter.    New Prescriptions   No medications on file    There are no discontinued medications.  No Follow-up on file.   Marcial Pacas, M.D. Ph.D.  [facility] [place], [place] [address] Phone: [phone] Fax:      [phone]

## 2014-02-27 DIAGNOSIS — M859 Disorder of bone density and structure, unspecified: Secondary | ICD-10-CM | POA: Diagnosis not present

## 2014-02-27 DIAGNOSIS — M8580 Other specified disorders of bone density and structure, unspecified site: Secondary | ICD-10-CM | POA: Diagnosis not present

## 2014-03-19 DIAGNOSIS — Z9889 Other specified postprocedural states: Secondary | ICD-10-CM | POA: Diagnosis not present

## 2014-03-19 DIAGNOSIS — G629 Polyneuropathy, unspecified: Secondary | ICD-10-CM | POA: Diagnosis not present

## 2014-03-19 DIAGNOSIS — Z90710 Acquired absence of both cervix and uterus: Secondary | ICD-10-CM | POA: Diagnosis not present

## 2014-03-19 DIAGNOSIS — I480 Paroxysmal atrial fibrillation: Secondary | ICD-10-CM | POA: Diagnosis not present

## 2014-03-19 DIAGNOSIS — C9110 Chronic lymphocytic leukemia of B-cell type not having achieved remission: Secondary | ICD-10-CM | POA: Diagnosis not present

## 2014-03-19 DIAGNOSIS — E785 Hyperlipidemia, unspecified: Secondary | ICD-10-CM | POA: Diagnosis not present

## 2014-03-19 DIAGNOSIS — M8580 Other specified disorders of bone density and structure, unspecified site: Secondary | ICD-10-CM | POA: Diagnosis not present

## 2014-03-19 DIAGNOSIS — G600 Hereditary motor and sensory neuropathy: Secondary | ICD-10-CM | POA: Diagnosis not present

## 2014-04-25 DIAGNOSIS — H43813 Vitreous degeneration, bilateral: Secondary | ICD-10-CM | POA: Diagnosis not present

## 2014-07-16 ENCOUNTER — Other Ambulatory Visit: Payer: Self-pay

## 2014-07-27 DIAGNOSIS — L03114 Cellulitis of left upper limb: Secondary | ICD-10-CM | POA: Diagnosis not present

## 2014-08-20 ENCOUNTER — Ambulatory Visit (INDEPENDENT_AMBULATORY_CARE_PROVIDER_SITE_OTHER): Payer: Medicare Other | Admitting: Neurology

## 2014-08-20 ENCOUNTER — Encounter: Payer: Self-pay | Admitting: Neurology

## 2014-08-20 VITALS — BP 132/69 | HR 86 | Ht 63.0 in | Wt 192.0 lb

## 2014-08-20 DIAGNOSIS — R269 Unspecified abnormalities of gait and mobility: Secondary | ICD-10-CM | POA: Diagnosis not present

## 2014-08-20 DIAGNOSIS — G629 Polyneuropathy, unspecified: Secondary | ICD-10-CM

## 2014-08-20 NOTE — Progress Notes (Signed)
PATIENT: Tasha [name] DOB: 11-05-1940  Chief Complaint  Patient presents with  . Gait Disturbance    Feels her gait has slightly worsened and she has had one fall since February.    HISTORICAL  Tasha [name] is a 74 -year-old right-handed white married female with a history of right-sided weakness beginning in 1964. She was a patient of Dr. Erling Cruz.  In NFA,2130,  She had numbness on right side of her body, lasting for 2 weeks,  she was tested at [facility], including CSF, was diagnosed with possible MS. she has never received any disease modification therapy, MRI of the brain in 2000, again in 2010 showed no significant abnormality.  MRI cervical spine showed no intrinsic cord lesion either   In 1977, she has right side weakness, right face, hand, leg, lasting for few weeks recovered  She began to dragging her right leg more since 2009, it is harder for her to walk.  She was given right ankle brace,    She is still very independent, driving with out difficulty, no bladder and bowel incontinence  Mother had trouble walking since age 98, very similar to her gait difficulty   She has intermittent neurologic symptoms and signs since that time, including occipital headaches, numbness in the face, Bell's palsy, unsteadiness with gait and a tendency to drag her right leg. She had a spinal fluid examination for MS which was negative.   MRI study of the brain 06/03/1998 showed no evidence of MS.  Evaluation by Dr. Ron Parker 09/21/2003 for right seventh nerve weakness was thought to be a right Bell's palsy.   Lab evaluations in1/27/2010 including lupus anticoagulant, anticardiolipin antibodies, ANA, angiotensin-converting enzyme, B12, Lyme titer,and methylmalonic acid were normal.   MRIs of the cervical spine and brain in 02/28/2008,  were unremarkable except for  tiny nonspecific subcortical white matter intensities.   EMG and nerve conduction study in 06/05/1998 was negative.  She has recurrent  numbness on the right side and top of her head to her neck, going around her mouth.  This will resolve after hours she states she has recurrent attacks of her illness every 13 years since 1964.   She went 18 years without recurrent symptoms until September 2008 when she noticed balance problems that  lasted until April 2011. She has vague numbness in her occiput when she bends forward. This occurs in the  right occipital region. She notices that she drags her legs.She has fatigue and has to sit down. She has numbness in her feet extending to her knees. Her mother walked like the patient does. Her mother had pes cavus.   EMG/NCV in 05/30/2009 showed mild to moderate peripheral neuropathy. EMG of right leg showed distal chronic denervation, there is also evidence of lumbosacral paraspinal muscles denervation. There is also evidence of chronic distal left leg denervation  Serum methylmalonic acid, 24-hour urine for heavy metals, RA factor, ACE, sedimentation rate, TSH, and SEP were normal. Vitamin D level was low, B12 was 164, and  ANA was positive. She's been on supplemental vitamin D and B 12. serum B12 level folate, were normal in 10/03/2009. She has no change in symptomatology.  In  05/2010,  she began to have increased gait difficulty with her left leg,  it was felt this was a compensation of left knee pain because of right  sided weakness. She has episodes of rapid heartbeat lasting as long as 30 minutes thought to represent atrial fibrillation, but Dr. Sallyanne Kuster  monitored her for 4 weeks and thinks she has atrial tachycardia. Warfarin was discontinued due to sinus tachycardia, not afib.  She is on 2 aspirin per day.She uses a cane and bilateral leg braces with improvement in knee pain.   She is  followed by Dr. 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 She denies bowel or bladder incontinence.  She occasionally has difficulty swallowing solids. She notices discomfort of her feet in her shoes.     She is using toe off braces. She uses a cane outside of her house. Her balance is worse and she has more numbness in her left foot.  She improved her balance with physical therapy    UPDATE August 3rd 2015: She is concerned about the possibility of CMT, her mother has progressive worsening gait difficulty as her mother aging, "similar to me",   also suffered stroke, she has urinary urgency, no incontinence,  She has no pain, she noticed mild right hand weakness, it is hard for her to grasp handle of spoon.  She continues to get mildly worse over the years, after discussion, she does not want to initiate an evaluation including repeat electrodiagnostic study or referral to tertiary center at this point   We have MRI of the lumbar, mild degenerative disc disease, no significant foraminal or canal stenosis  UPDATE Feb 1st 2016: There was no worsening of her symptoms, she continued to have mild gait difficulty, no change,  UPDATE August 20 2014: She is followed up at [facility] for CLL, not any treatment, mild falling episodes in April 2016, she noticed increased numbness at bilateral feet, as if on tight stocks, she complains of fatigue, more imbalance issue, dragging right leg more, still driving.   REVIEW OF SYSTEMS: Full 14 system review of systems performed and notable only for numbness, weakness, gait difficulty, bruise easily, activity change, fatigue  ALLERGIES: Allergies  Allergen Reactions  . Celebrex [Celecoxib] Other (See Comments)    Hypertension, poss TIA  . Statins Other (See Comments)    Muscle aches  . Tricor [Fenofibrate] Other (See Comments)    tricor - elevated liver enzymes    HOME MEDICATIONS: Current Outpatient Prescriptions  Medication Sig Dispense Refill  . aspirin 81 MG tablet Take 81 mg by mouth daily.    . cholecalciferol (VITAMIN D) 400 UNITS TABS Take 800 Units by mouth daily.    . Cyanocobalamin (VITAMIN B 12 PO) Take 1 tablet by mouth daily.    Marland Kitchen doxycycline  (VIBRA-TABS) 100 MG tablet Take by mouth.    . metoprolol tartrate (LOPRESSOR) 25 MG tablet Take 12.5 mg by mouth 2 (two) times daily.     No current facility-administered medications for this visit.    PAST MEDICAL HISTORY: Past Medical History  Diagnosis Date  . IFG (impaired fasting glucose)     e  . 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 Gallstones   . Atrial fibrillation     questionable, echo 03/23/11 - EF >55%, on warfarin  . Bell's palsy   . Chronic lymphoid leukemia, without mention of having achieved remission   . Personal history of other diseases of circulatory system   . Vitamin D deficiency   . Vitamin B12 deficiency   . MS (multiple sclerosis)   . Unspecified hereditary and idiopathic peripheral neuropathy   . Gait disturbance   . Tick bite     PAST SURGICAL HISTORY: Past Surgical History  Procedure Laterality Date  . Breast surgery  1960s  benign breast tumors  . Abdominal hysterectomy  1984    fibroids  . Laparoscopic bilateral salpingo oopherectomy  2003    ovarian cyst  . Cataract extraction w/ intraocular lens  implant, bilateral      FAMILY HISTORY: Family History  Problem Relation Age of Onset  . Coronary artery disease Father     died at 16  . Hypertension Father   . Diabetes Father   . Hypertension Mother   . Coronary artery disease Brother     died at 15  . Diabetes Brother   . Hypertension Brother   . Cancer Daughter     SOCIAL HISTORY:  History   Social History  . Marital Status: Married    Spouse Name: Colen  . Number of Children: 3  . Years of Education: college   Occupational History  .      Retired   Social History Main Topics  . Smoking status: Never Smoker   . Smokeless tobacco: Never Used  . Alcohol Use: No  . Drug Use: No  . Sexual Activity: Not on file   Other Topics Concern  . 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 Patient has college education.    Right  handed.   Caffeine- not every day . Soda or tea when she is out for dinner.     PHYSICAL EXAM   Filed Vitals:   08/20/14 1101  BP: 132/69  Pulse: 86  Height: 5' 3"  (1.6 m)  Weight: 192 lb (87.091 kg)    Not recorded      Body mass index is 34.02 kg/(m^2).  PHYSICAL EXAMNIATION:  Gen: NAD, conversant, well nourised, obese, well groomed                     Cardiovascular: Regular rate rhythm, no peripheral edema, warm, nontender. Eyes: Conjunctivae clear without exudates or hemorrhage Neck: Supple, no carotid bruise. Pulmonary: Clear to auscultation bilaterally   NEUROLOGICAL EXAM:  MENTAL STATUS: Speech:    Speech is normal; fluent and spontaneous with normal comprehension.  Cognition:    The patient is oriented to person, place, and time;     recent and remote memory intact;     language fluent;     normal attention, concentration,     fund of knowledge.  CRANIAL NERVES: CN II: Visual fields are full to confrontation. Fundoscopic exam is normal with sharp discs and no vascular changes. Pupil equal round reactive to light  CN III, IV, VI: extraocular movement are normal. No ptosis. CN V: Facial sensation is intact to pinprick in all 3 divisions bilaterally. Corneal responses are intact.  CN VII: Face is symmetric with normal eye closure and smile. CN VIII: Hearing is normal to rubbing fingers CN IX, X: Palate elevates symmetrically. Phonation is normal. CN XI: Head turning and shoulder shrug are intact CN XII: Tongue is midline with normal movements and no atrophy.  MOTOR: She has moderate right ankle dorsiflexion weakness, moderate right toe flexion extension weakness, mild left ankle dorsiflexion, toe flexion extension weakness  REFLEXES: Reflexes are 2+ and symmetric at the biceps, triceps, knees, and ankles. Plantar responses are flexor.  SENSORY: Length dependent decreased, pinprick and vibratory sensation to midshin level, preserved toe  proprioception  COORDINATION: Rapid alternating movements and fine finger movements are intact. There is no dysmetria on finger-to-nose and heel-knee-shin. There are no abnormal or extraneous movements.   GAIT/STANCE:  Need to push up to get up from seated position, bilateral foot drop, right worse than left, Romberg signs was positive  DIAGNOSTIC DATA (LABS, IMAGING, TESTING) - I reviewed patient records, labs, notes, testing and imaging myself where available.  Lab Results  Component Value Date   WBC 5.2 05/13/2009   HGB 13.3 05/13/2009   HCT 37.7 05/13/2009   MCV 85.6 05/13/2009   PLT 98* 05/13/2009      Component Value Date/Time   NA 140 05/13/2009 0834   K 4.0 05/13/2009 0834   CL 110 05/13/2009 0834   CO2 26 05/13/2009 0834   GLUCOSE 111* 05/13/2009 0834   BUN 14 05/13/2009 0834   CREATININE 0.71 05/13/2009 0834   CALCIUM 8.8 05/13/2009 0834   PROT 5.7* 05/13/2009 0834   ALBUMIN 3.6 05/13/2009 0834   AST 14 05/13/2009 0834   ALT 16 05/13/2009 0834   ALKPHOS 59 05/13/2009 0834   BILITOT 0.6 05/13/2009 0834   GFRNONAA >60 05/13/2009 0834   GFRAA  05/13/2009 0834    >60        The eGFR has been calculated using the MDRD equation. This calculation has not been validated in all clinical situations. eGFR's persistently <60 mL/min signify possible Chronic Kidney Disease.    ASSESSMENT AND PLAN  Tasha [name] is a 74 y.o. 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 Ph.D.  [facility] [address], [place], [place] [address] Ph: [phone] Fax: [phone]

## 2014-10-18 DIAGNOSIS — Z1231 Encounter for screening mammogram for malignant neoplasm of breast: Secondary | ICD-10-CM | POA: Diagnosis not present

## 2014-11-01 DIAGNOSIS — Z1389 Encounter for screening for other disorder: Secondary | ICD-10-CM | POA: Diagnosis not present

## 2014-11-01 DIAGNOSIS — G609 Hereditary and idiopathic neuropathy, unspecified: Secondary | ICD-10-CM | POA: Diagnosis not present

## 2014-11-01 DIAGNOSIS — Z23 Encounter for immunization: Secondary | ICD-10-CM | POA: Diagnosis not present

## 2014-11-01 DIAGNOSIS — C9110 Chronic lymphocytic leukemia of B-cell type not having achieved remission: Secondary | ICD-10-CM | POA: Diagnosis not present

## 2014-11-01 DIAGNOSIS — I471 Supraventricular tachycardia: Secondary | ICD-10-CM | POA: Diagnosis not present

## 2014-11-01 DIAGNOSIS — Z6830 Body mass index (BMI) 30.0-30.9, adult: Secondary | ICD-10-CM | POA: Diagnosis not present

## 2014-11-01 DIAGNOSIS — E669 Obesity, unspecified: Secondary | ICD-10-CM | POA: Diagnosis not present

## 2015-01-02 DIAGNOSIS — H35431 Paving stone degeneration of retina, right eye: Secondary | ICD-10-CM | POA: Diagnosis not present

## 2015-01-02 DIAGNOSIS — H40013 Open angle with borderline findings, low risk, bilateral: Secondary | ICD-10-CM | POA: Diagnosis not present

## 2015-01-02 DIAGNOSIS — H35411 Lattice degeneration of retina, right eye: Secondary | ICD-10-CM | POA: Diagnosis not present

## 2015-01-02 DIAGNOSIS — G441 Vascular headache, not elsewhere classified: Secondary | ICD-10-CM | POA: Diagnosis not present

## 2015-01-02 DIAGNOSIS — H35432 Paving stone degeneration of retina, left eye: Secondary | ICD-10-CM | POA: Diagnosis not present

## 2015-01-02 DIAGNOSIS — H35413 Lattice degeneration of retina, bilateral: Secondary | ICD-10-CM | POA: Diagnosis not present

## 2015-01-02 DIAGNOSIS — H35433 Paving stone degeneration of retina, bilateral: Secondary | ICD-10-CM | POA: Diagnosis not present

## 2015-01-02 DIAGNOSIS — H35412 Lattice degeneration of retina, left eye: Secondary | ICD-10-CM | POA: Diagnosis not present

## 2015-01-02 DIAGNOSIS — H3122 Choroidal dystrophy (central areolar) (generalized) (peripapillary): Secondary | ICD-10-CM | POA: Diagnosis not present

## 2015-01-02 DIAGNOSIS — H4423 Degenerative myopia, bilateral: Secondary | ICD-10-CM | POA: Diagnosis not present

## 2015-01-24 ENCOUNTER — Telehealth: Payer: Self-pay | Admitting: Neurology

## 2015-01-24 ENCOUNTER — Encounter: Payer: Self-pay | Admitting: *Deleted

## 2015-01-24 NOTE — Telephone Encounter (Signed)
Ok per Dr. Krista Blue to provide letter.  Patient aware and will come by our office next week to pick it up.

## 2015-01-24 NOTE — Telephone Encounter (Signed)
Pt called and says that she needs a letter for the clerk of courts stating her neurologic status. She is to serve jury duty in [place] and cannot sit very long due to numbness and cannot walk either. Please call and advise [phone]

## 2015-02-05 NOTE — Telephone Encounter (Signed)
Patient called to check status of letter for jury duty. Patient advised letter is up front to be picked up. Patient will come by tomorrow to pick up letter.

## 2015-02-20 ENCOUNTER — Encounter: Payer: Self-pay | Admitting: Nurse Practitioner

## 2015-02-20 ENCOUNTER — Ambulatory Visit (INDEPENDENT_AMBULATORY_CARE_PROVIDER_SITE_OTHER): Payer: Medicare Other | Admitting: Nurse Practitioner

## 2015-02-20 VITALS — BP 98/61 | HR 86 | Ht 67.0 in | Wt 197.4 lb

## 2015-02-20 DIAGNOSIS — E559 Vitamin D deficiency, unspecified: Secondary | ICD-10-CM

## 2015-02-20 DIAGNOSIS — G609 Hereditary and idiopathic neuropathy, unspecified: Secondary | ICD-10-CM

## 2015-02-20 DIAGNOSIS — R269 Unspecified abnormalities of gait and mobility: Secondary | ICD-10-CM

## 2015-02-20 NOTE — Patient Instructions (Signed)
Continue to use cane for ambulation F/U in 6 months

## 2015-02-20 NOTE — Progress Notes (Signed)
I have reviewed and agreed above plan. 

## 2015-02-20 NOTE — Progress Notes (Signed)
[facility]  PATIENT: Tasha [name] DOB: 10/18/1940   REASON FOR VISIT: follow up for peripheral neuropathy, gait disorder HISTORY FROM:patient    HISTORY OF PRESENT ILLNESS: HISTORY: Tasha [name] is a 75 -year-old right-handed white married female with a history of right-sided weakness beginning in 1964. She was a patient of Dr. Erling Cruz.  In YSH,6837, She had numbness on right side of her body, lasting for 2 weeks, she was tested at [facility], including CSF, was diagnosed with possible MS. she has never received any disease modification therapy, MRI of the brain in 2000, again in 2010 showed no significant abnormality. MRI cervical spine showed no intrinsic cord lesion either  In 1977, she has right side weakness, right face, hand, leg, lasting for few weeks recovered She began to dragging her right leg more since 2009, it is harder for her to walk. She was given right ankle brace,  She is still very independent, driving with out difficulty, no bladder and bowel incontinence Mother had trouble walking since age 64, very similar to her gait difficulty She has intermittent neurologic symptoms and signs since that time, including occipital headaches, numbness in the face, Bell's palsy, unsteadiness with gait and a tendency to drag her right leg. She had a spinal fluid examination for MS which was negative.  MRI study of the brain 06/03/1998 showed no evidence of MS. Evaluation by Dr. Ron Parker 09/21/2003 for right seventh nerve weakness was thought to be a right Bell's palsy.  Lab evaluations in1/27/2010 including lupus anticoagulant, anticardiolipin antibodies, ANA, angiotensin-converting enzyme, B12, Lyme titer,and methylmalonic acid were normal.  MRIs of the cervical spine and brain in 02/28/2008, were unremarkable except for tiny nonspecific subcortical white matter intensities.  EMG and nerve conduction study in 06/05/1998 was negative. She has recurrent numbness  on the right side and top of her head to her neck, going around her mouth. This will resolve after hours she states she has recurrent attacks of her illness every 13 years since 1964.  She went 18 years without recurrent symptoms until September 2008 when she noticed balance problems that lasted until April 2011. She has vague numbness in her occiput when she bends forward. This occurs in the right occipital region. She notices that she drags her legs.She has fatigue and has to sit down. She has numbness in her feet extending to her knees. Her mother walked like the patient does. Her mother had pes cavus.   EMG/NCV in 05/30/2009 showed mild to moderate peripheral neuropathy. EMG of right leg showed distal chronic denervation, there is also evidence of lumbosacral paraspinal muscles denervation. There is also evidence of chronic distal left leg denervation Serum methylmalonic acid, 24-hour urine for heavy metals, RA factor, ACE, sedimentation rate, TSH, and SEP were normal. Vitamin D level was low, B12 was 164, and ANA was positive. She's been on supplemental vitamin D and B 12. serum B12 level folate, were normal in 10/03/2009. She has no change in symptomatology. In 05/2010, she began to have increased gait difficulty with her left leg, it was felt this was a compensation of left knee pain because of right sided weakness. She has episodes of rapid heartbeat lasting as long as 30 minutes thought to represent atrial fibrillation, but Dr. Sallyanne Kuster monitored her for 4 weeks and thinks she has atrial tachycardia. Warfarin was discontinued due to sinus tachycardia, not afib.She is on 2 aspirin per day.She uses a cane and bilateral leg braces with improvement in knee pain.  She is followed by Dr. Trilby Drummer at [facility] for chronic lymphocytic leukemia. She denies bowel or bladder incontinence. She occasionally has difficulty swallowing solids. She notices discomfort of her feet in her shoes.  She is using toe  off braces. She uses a cane outside of her house. Her balance is worse and she has more numbness in her left foot. She improved her balance with physical therapy   UPDATE August 3rd 2015:yy She is concerned about the possibility of CMT, her mother has progressive worsening gait difficulty as her mother aging, "similar to me", also suffered stroke, she has urinary urgency, no incontinence, She has no pain, she noticed mild right hand weakness, it is hard for her to grasp handle of spoon. She continues to get mildly worse over the years, after discussion, she does not want to initiate an evaluation including repeat electrodiagnostic study or referral to tertiary center at this point  We have MRI of the lumbar, mild degenerative disc disease, no significant foraminal or canal stenosis  UPDATE Feb 1st 2016YY There was no worsening of her symptoms, she continued to have mild gait difficulty, no change,  UPDATE August 20 2014:YY She is followed up at [facility] for CLL, not any treatment, mild falling episodes in April 2016, she noticed increased numbness at bilateral feet, as if on tight stocks, she complains of fatigue, more imbalance issue, dragging right leg more, still driving.  UPDATE 02/20/2015 Tasha [name], 75 year old female returns for follow-up.She continues to be followed at [facility] for CLL,Dr. Trilby Drummer.She is not currently on treatment. She denies any falls in the last 6 months. She is ambulating with a cane, continues to drive without difficulty, continues to complain of balance issues. She returns for reevaluation  REVIEW OF SYSTEMS: Full 14 system review of systems performed and notable only for those listed, all others are neg:  Constitutional: fatigue  Cardiovascular: neg Ear/Nose/Throat: neg  Skin: neg Eyes: neg Respiratory: neg Gastroitestinal: neg  Hematology/Lymphatic: neg  Endocrine: neg Musculoskeletal:walking difficulty Allergy/Immunology: neg Neurological: occasional  headache, numbness weakness Psychiatric: neg Sleep : neg   ALLERGIES: Allergies  Allergen Reactions  . Celebrex [Celecoxib] Other (See Comments)    Hypertension, poss TIA  . Statins Other (See Comments)    Muscle aches  . Tricor [Fenofibrate] Other (See Comments)    tricor - elevated liver enzymes    HOME MEDICATIONS: Outpatient Prescriptions Prior to Visit  Medication Sig Dispense Refill  . aspirin 81 MG tablet Take 81 mg by mouth daily.    . cholecalciferol (VITAMIN D) 400 UNITS TABS Take 800 Units by mouth daily.    . Cyanocobalamin (VITAMIN B 12 PO) Take 1 tablet by mouth daily.    . metoprolol tartrate (LOPRESSOR) 25 MG tablet Take 12.5 mg by mouth 2 (two) times daily.     No facility-administered medications prior to visit.    PAST MEDICAL HISTORY: Past Medical History  Diagnosis Date  . IFG (impaired fasting glucose)     e  . 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 Gallstones   . Atrial fibrillation ([place])     questionable, echo 03/23/11 - EF >55%, on warfarin  . Bell's palsy   . Chronic lymphoid leukemia, without mention of having achieved remission   . Personal history of other diseases of circulatory system   . Vitamin D deficiency   . Vitamin B12 deficiency   . MS (multiple sclerosis) ([place])   . Unspecified hereditary and idiopathic peripheral neuropathy   . Gait  disturbance   . Tick bite     PAST SURGICAL HISTORY: Past Surgical History  Procedure Laterality Date  . Breast surgery  1960s    benign breast tumors  . Abdominal hysterectomy  1984    fibroids  . Laparoscopic bilateral salpingo oopherectomy  2003    ovarian cyst  . Cataract extraction w/ intraocular lens  implant, bilateral      FAMILY HISTORY: Family History  Problem Relation Age of Onset  . Coronary artery disease Father     died at 21  . Hypertension Father   . Diabetes Father   . Hypertension Mother   . Coronary artery disease Brother     died at 80  . Diabetes Brother   . Hypertension  Brother   . Cancer Daughter     SOCIAL HISTORY: Social History   Social History  . Marital Status: Married    Spouse Name: Colen  . Number of Children: 3  . Years of Education: college   Occupational History  .      Retired   Social History Main Topics  . Smoking status: Never Smoker   . Smokeless tobacco: Never Used  . Alcohol Use: No  . Drug Use: No  . Sexual Activity: Not on file   Other Topics Concern  . 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 Patient has college education.    Right handed.   Caffeine- not every day . Soda or tea when she is out for dinner.     PHYSICAL EXAM  Filed Vitals:   02/20/15 0955  BP: 98/61  Pulse: 86  Height: 5\' 3"  (1.6 m)  Weight: 197 lb 6.4 oz (89.54 kg)   Body mass index is 34.98 kg/(m^2). Gen: NAD, conversant, well nourised, obese, well groomed  Cardiovascular: Regular rate rhythm,  Neck: Supple, no carotid bruit. Pulmonary: Clear to auscultation bilaterally   NEUROLOGICAL EXAM:  MENTAL STATUS: Speech:Speech is normal; fluent and spontaneous with normal comprehension.  Cognition:The patient is oriented to person, place, and time;  recent and remote memory intact;  language fluent; normal attention, concentration, fund of knowledge.  CRANIAL NERVES: CN II: Visual fields are full to confrontation. Fundoscopic exam is normal with sharp discs  Pupil equal round reactive to light  CN III, IV, VI: extraocular movement are normal. No ptosis. CN V: Facial sensation is intact to pinprick in all 3 divisions bilaterally.  CN VII: Face is symmetric with normal eye closure and smile. CN VIII: Hearing is normal to rubbing fingers CN IX, X: Palate elevates symmetrically. Phonation is normal. CN XI: Head turning and shoulder shrug are intact CN XII: Tongue is midline with normal movements and no atrophy.  MOTOR:She has moderate right ankle dorsiflexion  weakness, moderate right toe flexion extension weakness, mild left ankle dorsiflexion, toe flexion extension weakness  REFLEXES:Reflexes are 2+ and symmetric at the biceps, triceps, knees, and ankles. Plantar responses are flexor. SENSORY:Length dependent decreased, pinprick and vibratory sensation to midshin level, preserved toe proprioception COORDINATION:Rapid alternating movements and fine finger movements are intact. There is no dysmetria on finger-to-nose and heel-knee-shin. There are no abnormal or extraneous movements.  GAIT/STANCE:Need to push up to get up from seated position, bilateral foot drop, right worse than left, Romberg signs was positive.Ambulated with single-point cane   DIAGNOSTIC DATA (LABS, IMAGING, TESTING)    ASSESSMENT AND PLAN Tasha [name] is a 75 y.o. 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

## 2015-03-20 DIAGNOSIS — D801 Nonfamilial hypogammaglobulinemia: Secondary | ICD-10-CM

## 2015-03-20 HISTORY — DX: Nonfamilial hypogammaglobulinemia: D80.1

## 2015-03-25 DIAGNOSIS — C9110 Chronic lymphocytic leukemia of B-cell type not having achieved remission: Secondary | ICD-10-CM | POA: Diagnosis not present

## 2015-03-25 DIAGNOSIS — D801 Nonfamilial hypogammaglobulinemia: Secondary | ICD-10-CM | POA: Diagnosis not present

## 2015-08-20 ENCOUNTER — Ambulatory Visit (INDEPENDENT_AMBULATORY_CARE_PROVIDER_SITE_OTHER): Payer: Medicare Other | Admitting: Nurse Practitioner

## 2015-08-20 ENCOUNTER — Encounter: Payer: Self-pay | Admitting: Nurse Practitioner

## 2015-08-20 VITALS — BP 126/64 | HR 74 | Ht 67.0 in | Wt 202.2 lb

## 2015-08-20 DIAGNOSIS — R269 Unspecified abnormalities of gait and mobility: Secondary | ICD-10-CM | POA: Diagnosis not present

## 2015-08-20 DIAGNOSIS — G609 Hereditary and idiopathic neuropathy, unspecified: Secondary | ICD-10-CM | POA: Diagnosis not present

## 2015-08-20 DIAGNOSIS — E538 Deficiency of other specified B group vitamins: Secondary | ICD-10-CM | POA: Diagnosis not present

## 2015-08-20 NOTE — Patient Instructions (Signed)
Continue to use cane for ambulation Continue moderate exercise F/U in 6 months

## 2015-08-20 NOTE — Progress Notes (Signed)
[facility]  PATIENT: Tasha [name] DOB: 10/18/1940   REASON FOR VISIT: follow up for peripheral neuropathy, gait disorder HISTORY FROM:patient    HISTORY OF PRESENT ILLNESS: HISTORY: Tasha [name] is a 75 -year-old right-handed white married female with a history of right-sided weakness beginning in 1964. She was a patient of Dr. Erling Cruz.  In YSH,6837, She had numbness on right side of her body, lasting for 2 weeks, she was tested at [facility], including CSF, was diagnosed with possible MS. she has never received any disease modification therapy, MRI of the brain in 2000, again in 2010 showed no significant abnormality. MRI cervical spine showed no intrinsic cord lesion either  In 1977, she has right side weakness, right face, hand, leg, lasting for few weeks recovered She began to dragging her right leg more since 2009, it is harder for her to walk. She was given right ankle brace,  She is still very independent, driving with out difficulty, no bladder and bowel incontinence Mother had trouble walking since age 64, very similar to her gait difficulty She has intermittent neurologic symptoms and signs since that time, including occipital headaches, numbness in the face, Bell's palsy, unsteadiness with gait and a tendency to drag her right leg. She had a spinal fluid examination for MS which was negative.  MRI study of the brain 06/03/1998 showed no evidence of MS. Evaluation by Dr. Ron Parker 09/21/2003 for right seventh nerve weakness was thought to be a right Bell's palsy.  Lab evaluations in1/27/2010 including lupus anticoagulant, anticardiolipin antibodies, ANA, angiotensin-converting enzyme, B12, Lyme titer,and methylmalonic acid were normal.  MRIs of the cervical spine and brain in 02/28/2008, were unremarkable except for tiny nonspecific subcortical white matter intensities.  EMG and nerve conduction study in 06/05/1998 was negative. She has recurrent numbness  on the right side and top of her head to her neck, going around her mouth. This will resolve after hours she states she has recurrent attacks of her illness every 13 years since 1964.  She went 18 years without recurrent symptoms until September 2008 when she noticed balance problems that lasted until April 2011. She has vague numbness in her occiput when she bends forward. This occurs in the right occipital region. She notices that she drags her legs.She has fatigue and has to sit down. She has numbness in her feet extending to her knees. Her mother walked like the patient does. Her mother had pes cavus.   EMG/NCV in 05/30/2009 showed mild to moderate peripheral neuropathy. EMG of right leg showed distal chronic denervation, there is also evidence of lumbosacral paraspinal muscles denervation. There is also evidence of chronic distal left leg denervation Serum methylmalonic acid, 24-hour urine for heavy metals, RA factor, ACE, sedimentation rate, TSH, and SEP were normal. Vitamin D level was low, B12 was 164, and ANA was positive. She's been on supplemental vitamin D and B 12. serum B12 level folate, were normal in 10/03/2009. She has no change in symptomatology. In 05/2010, she began to have increased gait difficulty with her left leg, it was felt this was a compensation of left knee pain because of right sided weakness. She has episodes of rapid heartbeat lasting as long as 30 minutes thought to represent atrial fibrillation, but Dr. Sallyanne Kuster monitored her for 4 weeks and thinks she has atrial tachycardia. Warfarin was discontinued due to sinus tachycardia, not afib.She is on 2 aspirin per day.She uses a cane and bilateral leg braces with improvement in knee pain.  She is followed by Dr. 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 She denies bowel or bladder incontinence. She occasionally has difficulty swallowing solids. She notices discomfort of her feet in her shoes.  She is using toe  off braces. She uses a cane outside of her house. Her balance is worse and she has more numbness in her left foot. She improved her balance with physical therapy   UPDATE August 3rd 2015:yy She is concerned about the possibility of CMT, her mother has progressive worsening gait difficulty as her mother aging, "similar to me", also suffered stroke, she has urinary urgency, no incontinence, She has no pain, she noticed mild right hand weakness, it is hard for her to grasp handle of spoon. She continues to get mildly worse over the years, after discussion, she does not want to initiate an evaluation including repeat electrodiagnostic study or referral to tertiary center at this point  We have MRI of the lumbar, mild degenerative disc disease, no significant foraminal or canal stenosis  UPDATE Feb 1st 2016YY There was no worsening of her symptoms, she continued to have mild gait difficulty, no change,  UPDATE August 20 2014:YY She is followed up at [facility] for CLL, not any treatment, mild falling episodes in April 2016, she noticed increased numbness at bilateral feet, as if on tight stocks, she complains of fatigue, more imbalance issue, dragging right leg more, still driving.  UPDATE 08/01/2017CM Tasha [name], 75 year old female returns for follow-up.She continues to be followed at [facility] for CLL,Dr. Trilby Drummer.She is not currently on treatment. She is followed here for peripheral neuropathy. She denies any falls in the last 6 months. She is ambulating with a cane, continues to drive without difficulty, continues to complain of balance issues. She has a bilateral foot drop. She continues to have numbness but denies burning or painful sensations in her extremities She returns for reevaluation  REVIEW OF SYSTEMS: Full 14 system review of systems performed and notable only for those listed, all others are neg:  Constitutional: fatigue  Cardiovascular: Palpitations Ear/Nose/Throat: neg  Skin: neg Eyes:  neg Respiratory: neg Gastroitestinal: neg  Hematology/Lymphatic: neg  Endocrine: neg Musculoskeletal:walking difficulty Allergy/Immunology: neg Neurological: occasional headache, numbness weakness Psychiatric: neg Sleep : neg   ALLERGIES: Allergies  Allergen Reactions  . Celebrex [Celecoxib] Other (See Comments)    Hypertension, poss TIA  . Statins Other (See Comments)    Muscle aches  . Tricor [Fenofibrate] Other (See Comments)    tricor - elevated liver enzymes    HOME MEDICATIONS: Outpatient Medications Prior to Visit  Medication Sig Dispense Refill  . aspirin 81 MG tablet Take 81 mg by mouth daily.    . cholecalciferol (VITAMIN D) 400 UNITS TABS Take 800 Units by mouth daily.    . Cyanocobalamin (VITAMIN B 12 PO) Take 1 tablet by mouth daily.    . metoprolol tartrate (LOPRESSOR) 25 MG tablet Take 12.5 mg by mouth 2 (two) times daily.     No facility-administered medications prior to visit.     PAST MEDICAL HISTORY: Past Medical History:  Diagnosis Date  . Atrial fibrillation ([place])    questionable, echo 03/23/11 - EF >55%, on warfarin  . Bell's palsy   . 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 Chronic lymphoid leukemia, without mention of having achieved remission   . Gait disturbance   . Gallstones   . 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 IFG (impaired fasting glucose)    e  . MS (multiple sclerosis) ([place])   .  Personal history of other diseases of circulatory system   . Tick bite   . Unspecified hereditary and idiopathic peripheral neuropathy   . Vitamin B12 deficiency   . Vitamin D deficiency     PAST SURGICAL HISTORY: Past Surgical History:  Procedure Laterality Date  . ABDOMINAL HYSTERECTOMY  1984   fibroids  . BREAST SURGERY  1960s   benign breast tumors  . CATARACT EXTRACTION W/ INTRAOCULAR LENS  IMPLANT, BILATERAL    . LAPAROSCOPIC BILATERAL SALPINGO OOPHERECTOMY  2003   ovarian cyst    FAMILY HISTORY: Family History  Problem Relation Age of Onset    . Hypertension Mother   . Coronary artery disease Father     died at 43  . Hypertension Father   . Diabetes Father   . Coronary artery disease Brother     died at 61  . Diabetes Brother   . Hypertension Brother   . Cancer Daughter     SOCIAL HISTORY: Social History   Social History  . Marital status: Married    Spouse name: Colen  . Number of children: 3  . Years of education: college   Occupational History  .      Retired   Social History Main Topics  . Smoking status: Never Smoker  . Smokeless tobacco: Never Used  . Alcohol use No  . Drug use: No  . Sexual activity: Not on file   Other Topics Concern  . 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 Patient has college education.    Right handed.   Caffeine- not every day . Soda or tea when she is out for dinner.     PHYSICAL EXAM  Vitals:   08/20/15 0920  BP: 126/64  Pulse: 74  Weight: 202 lb 3.2 oz (91.7 kg)  Height: 5\' 7"  (1.702 m)   Body mass index is 31.67 kg/m. Gen: NAD, conversant, well nourised, obese, well groomed  Cardiovascular: Regular rate rhythm,  Neck: Supple, no carotid bruit. NEUROLOGICAL EXAM:  MENTAL STATUS: Speech:Speech is normal; fluent and spontaneous with normal comprehension.  Cognition:The patient is oriented to person, place, and time;  recent and remote memory intact;  language fluent; normal attention, concentration, fund of knowledge.  CRANIAL NERVES: CN II: Visual fields are full to confrontation. Fundoscopic exam is normal with sharp discs  Pupil equal round reactive to light  CN III, IV, VI: extraocular movement are normal. No ptosis. CN V: Facial sensation is intact to pinprick in all 3 divisions bilaterally.  CN VII: Face is symmetric with normal eye closure and smile. CN VIII: Hearing is normal to rubbing fingers CN IX, X: Palate elevates symmetrically. Phonation is normal. CN XI: Head  turning and shoulder shrug are intact CN XII: Tongue is midline with normal movements and no atrophy.  MOTOR:She has moderate right ankle dorsiflexion weakness, moderate right toe flexion extension weakness, mild left ankle dorsiflexion, toe flexion extension weakness  REFLEXES:Reflexes are 2+ and symmetric at the biceps, triceps, knees, and ankles. Plantar responses are flexor. SENSORY:Length dependent decreased, pinprick and vibratory sensation to midshin level, preserved toe proprioception COORDINATION:Rapid alternating movements and fine finger movements are intact. There is no dysmetria on finger-to-nose and heel-knee-shin. There are no abnormal or extraneous movements.  GAIT/STANCE:Need to push up to get up from seated position, bilateral foot drop, right worse than left, Romberg signs was positive.Ambulated with single-point cane   DIAGNOSTIC DATA (LABS, IMAGING, TESTING)  ASSESSMENT AND PLAN Tasha [name] is a 75 y.o. 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 She has distal leg muscle weakness, length dependent sensory changes, Localize the lesion to peripheral nervous system, hereditary vs acquired axonal peripheral neuropathy, slow progressive course. The patient is a current patient of Dr. Krista Blue  who is out of the office today . This note is sent to the work in doctor.      PLAN: Continue to use cane for ambulation Continue moderate exercise Call if neuropathy symptoms become painful F/U in 8 months Dennie Bible, [facility], [facility], [facility] [address],  [place], [place] [address] [phone]

## 2015-08-27 NOTE — Progress Notes (Signed)
I agree with the above plan 

## 2015-08-28 ENCOUNTER — Telehealth: Payer: Self-pay | Admitting: *Deleted

## 2015-08-28 NOTE — Telephone Encounter (Signed)
Handicap placard filled out, to Dr. Aliene Beams for signature.

## 2015-09-02 NOTE — Telephone Encounter (Signed)
Pt called to check on status. Please call pt to give up date.

## 2015-09-02 NOTE — Telephone Encounter (Signed)
Patient aware that the handicap placard application has been completed, signed and placed up front for pick up.

## 2015-10-31 DIAGNOSIS — Z0000 Encounter for general adult medical examination without abnormal findings: Secondary | ICD-10-CM | POA: Diagnosis not present

## 2015-10-31 DIAGNOSIS — I471 Supraventricular tachycardia: Secondary | ICD-10-CM | POA: Diagnosis not present

## 2015-10-31 DIAGNOSIS — Z6831 Body mass index (BMI) 31.0-31.9, adult: Secondary | ICD-10-CM | POA: Diagnosis not present

## 2015-10-31 DIAGNOSIS — Z1389 Encounter for screening for other disorder: Secondary | ICD-10-CM | POA: Diagnosis not present

## 2015-10-31 DIAGNOSIS — L749 Eccrine sweat disorder, unspecified: Secondary | ICD-10-CM | POA: Diagnosis not present

## 2015-10-31 DIAGNOSIS — C9110 Chronic lymphocytic leukemia of B-cell type not having achieved remission: Secondary | ICD-10-CM | POA: Diagnosis not present

## 2015-10-31 DIAGNOSIS — R0989 Other specified symptoms and signs involving the circulatory and respiratory systems: Secondary | ICD-10-CM | POA: Diagnosis not present

## 2015-10-31 DIAGNOSIS — E782 Mixed hyperlipidemia: Secondary | ICD-10-CM | POA: Diagnosis not present

## 2015-10-31 DIAGNOSIS — Z23 Encounter for immunization: Secondary | ICD-10-CM | POA: Diagnosis not present

## 2015-10-31 DIAGNOSIS — E669 Obesity, unspecified: Secondary | ICD-10-CM | POA: Diagnosis not present

## 2015-10-31 DIAGNOSIS — G609 Hereditary and idiopathic neuropathy, unspecified: Secondary | ICD-10-CM | POA: Diagnosis not present

## 2015-10-31 DIAGNOSIS — E538 Deficiency of other specified B group vitamins: Secondary | ICD-10-CM | POA: Diagnosis not present

## 2015-11-21 DIAGNOSIS — Z961 Presence of intraocular lens: Secondary | ICD-10-CM | POA: Diagnosis not present

## 2015-11-21 DIAGNOSIS — Z0100 Encounter for examination of eyes and vision without abnormal findings: Secondary | ICD-10-CM | POA: Diagnosis not present

## 2015-11-21 DIAGNOSIS — G43909 Migraine, unspecified, not intractable, without status migrainosus: Secondary | ICD-10-CM | POA: Diagnosis not present

## 2015-11-21 DIAGNOSIS — H5213 Myopia, bilateral: Secondary | ICD-10-CM | POA: Diagnosis not present

## 2016-03-23 DIAGNOSIS — G609 Hereditary and idiopathic neuropathy, unspecified: Secondary | ICD-10-CM | POA: Diagnosis not present

## 2016-03-23 DIAGNOSIS — D801 Nonfamilial hypogammaglobulinemia: Secondary | ICD-10-CM | POA: Diagnosis not present

## 2016-03-23 DIAGNOSIS — C9110 Chronic lymphocytic leukemia of B-cell type not having achieved remission: Secondary | ICD-10-CM | POA: Diagnosis not present

## 2016-03-23 DIAGNOSIS — I4891 Unspecified atrial fibrillation: Secondary | ICD-10-CM | POA: Diagnosis not present

## 2016-04-20 ENCOUNTER — Ambulatory Visit (INDEPENDENT_AMBULATORY_CARE_PROVIDER_SITE_OTHER): Payer: Medicare Other | Admitting: Nurse Practitioner

## 2016-04-20 ENCOUNTER — Encounter: Payer: Self-pay | Admitting: Nurse Practitioner

## 2016-04-20 VITALS — BP 113/65 | HR 79 | Wt 201.6 lb

## 2016-04-20 DIAGNOSIS — R269 Unspecified abnormalities of gait and mobility: Secondary | ICD-10-CM | POA: Diagnosis not present

## 2016-04-20 DIAGNOSIS — G609 Hereditary and idiopathic neuropathy, unspecified: Secondary | ICD-10-CM | POA: Diagnosis not present

## 2016-04-20 NOTE — Progress Notes (Signed)
I have read the note, and I agree with the clinical assessment and plan.  Jabree Rebert A. Ronelle Michie, MD, PhD Certified in Neurology, Clinical Neurophysiology, Sleep Medicine, Pain Medicine and Neuroimaging  [facility] [address], [address] [place], [place] [address] [phone]  

## 2016-04-20 NOTE — Progress Notes (Signed)
[facility]  PATIENT: Tasha [name] DOB: 10/18/1940   REASON FOR VISIT: follow up for peripheral neuropathy, gait disorder HISTORY FROM:patient    HISTORY OF PRESENT ILLNESS: HISTORY: Tasha [name] is a 76 -year-old right-handed white married female with a history of right-sided weakness beginning in 1964. She was a patient of Dr. Erling Cruz.  In YSH,6837, She had numbness on right side of her body, lasting for 2 weeks, she was tested at [facility], including CSF, was diagnosed with possible MS. she has never received any disease modification therapy, MRI of the brain in 2000, again in 2010 showed no significant abnormality. MRI cervical spine showed no intrinsic cord lesion either  In 1977, she has right side weakness, right face, hand, leg, lasting for few weeks recovered She began to dragging her right leg more since 2009, it is harder for her to walk. She was given right ankle brace,  She is still very independent, driving with out difficulty, no bladder and bowel incontinence Mother had trouble walking since age 64, very similar to her gait difficulty She has intermittent neurologic symptoms and signs since that time, including occipital headaches, numbness in the face, Bell's palsy, unsteadiness with gait and a tendency to drag her right leg. She had a spinal fluid examination for MS which was negative.  MRI study of the brain 06/03/1998 showed no evidence of MS. Evaluation by Dr. Ron Parker 09/21/2003 for right seventh nerve weakness was thought to be a right Bell's palsy.  Lab evaluations in1/27/2010 including lupus anticoagulant, anticardiolipin antibodies, ANA, angiotensin-converting enzyme, B12, Lyme titer,and methylmalonic acid were normal.  MRIs of the cervical spine and brain in 02/28/2008, were unremarkable except for tiny nonspecific subcortical white matter intensities.  EMG and nerve conduction study in 06/05/1998 was negative. She has recurrent numbness  on the right side and top of her head to her neck, going around her mouth. This will resolve after hours she states she has recurrent attacks of her illness every 13 years since 1964.  She went 18 years without recurrent symptoms until September 2008 when she noticed balance problems that lasted until April 2011. She has vague numbness in her occiput when she bends forward. This occurs in the right occipital region. She notices that she drags her legs.She has fatigue and has to sit down. She has numbness in her feet extending to her knees. Her mother walked like the patient does. Her mother had pes cavus.   EMG/NCV in 05/30/2009 showed mild to moderate peripheral neuropathy. EMG of right leg showed distal chronic denervation, there is also evidence of lumbosacral paraspinal muscles denervation. There is also evidence of chronic distal left leg denervation Serum methylmalonic acid, 24-hour urine for heavy metals, RA factor, ACE, sedimentation rate, TSH, and SEP were normal. Vitamin D level was low, B12 was 164, and ANA was positive. She's been on supplemental vitamin D and B 12. serum B12 level folate, were normal in 10/03/2009. She has no change in symptomatology. In 05/2010, she began to have increased gait difficulty with her left leg, it was felt this was a compensation of left knee pain because of right sided weakness. She has episodes of rapid heartbeat lasting as long as 30 minutes thought to represent atrial fibrillation, but Dr. Sallyanne Kuster monitored her for 4 weeks and thinks she has atrial tachycardia. Warfarin was discontinued due to sinus tachycardia, not afib.She is on 2 aspirin per day.She uses a cane and bilateral leg braces with improvement in knee pain.  She is followed by Dr. Trilby Drummer at [facility] for chronic lymphocytic leukemia. She denies bowel or bladder incontinence. She occasionally has difficulty swallowing solids. She notices discomfort of her feet in her shoes.  She is using toe  off braces. She uses a cane outside of her house. Her balance is worse and she has more numbness in her left foot. She improved her balance with physical therapy   UPDATE 08/01/2017CM Tasha [name], 76 year old female returns for follow-up.She continues to be followed at [facility] for CLL,Dr. Trilby Drummer.She is not currently on treatment. She is followed here for peripheral neuropathy. She denies any falls in the last 6 months. She is ambulating with a cane, continues to drive without difficulty, continues to complain of balance issues. She has a bilateral foot drop. She continues to have numbness but denies burning or painful sensations in her extremities She returns for reevaluation UPDATE 04/02/2018CM Tasha [name], 76 year old female returns for follow-up for peripheral neuropathy. She has had one fall in the last 8 months. She has a bilateral foot drop, she has tried AFOs in the past but  she had more trouble walking with AFOs and she had lower extremity swelling. She continues to have numbness in both lower extremities but denies any painful sensations or burning sensations in her lower extremities. She continues to be followed at [facility] for CLL, Dr. Trilby Drummer. This was diagnosed in 2001. She denies any difficulty with driving or performing other tasks however she says it just takes her longer. She also has a history of atrial fibrillation and is on Metoprolol. . She returns for reevaluation. Her mother and grandmother and uncle had similar symptoms with their neuropathy  REVIEW OF SYSTEMS: Full 14 system review of systems performed and notable only for those listed, all others are neg:  Constitutional: fatigue  Cardiovascular: Palpitations Ear/Nose/Throat: neg  Skin: neg Eyes: neg Respiratory: neg Gastroitestinal: neg  Hematology/Lymphatic: neg  Endocrine: neg Musculoskeletal:walking difficulty Allergy/Immunology: neg Neurological: occasional headache, numbness weakness Psychiatric: neg Sleep :  neg   ALLERGIES: Allergies  Allergen Reactions  . Celebrex [Celecoxib] Other (See Comments)    Hypertension, poss TIA  . Statins Other (See Comments)    Muscle aches  . Tricor [Fenofibrate] Other (See Comments)    tricor - elevated liver enzymes    HOME MEDICATIONS: Outpatient Medications Prior to Visit  Medication Sig Dispense Refill  . aspirin 81 MG tablet Take 81 mg by mouth daily.    . cholecalciferol (VITAMIN D) 400 UNITS TABS Take 800 Units by mouth daily.    . Cyanocobalamin (VITAMIN B 12 PO) Take 1 tablet by mouth daily.    . metoprolol tartrate (LOPRESSOR) 25 MG tablet Take 12.5 mg by mouth 2 (two) times daily.     No facility-administered medications prior to visit.     PAST MEDICAL HISTORY: Past Medical History:  Diagnosis Date  . Atrial fibrillation ([place])    questionable, echo 03/23/11 - EF >55%, on warfarin  . Bell's palsy   . 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 Gait disturbance   . Gallstones   . 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 IFG (impaired fasting glucose)    e  . MS (multiple sclerosis) ([place])   . Personal history of other diseases of circulatory system   . Tick bite   . Unspecified hereditary and idiopathic peripheral neuropathy   . Vitamin B12 deficiency   . Vitamin D deficiency  PAST SURGICAL HISTORY: Past Surgical History:  Procedure Laterality Date  . ABDOMINAL HYSTERECTOMY  1984   fibroids  . BREAST SURGERY  1960s   benign breast tumors  . CATARACT EXTRACTION W/ INTRAOCULAR LENS  IMPLANT, BILATERAL    . LAPAROSCOPIC BILATERAL SALPINGO OOPHERECTOMY  2003   ovarian cyst    FAMILY HISTORY: Family History  Problem Relation Age of Onset  . Hypertension Mother   . Coronary artery disease Father     died at 31  . Hypertension Father   . Diabetes Father   . Coronary artery disease Brother     died at 14  . Diabetes Brother   . Hypertension Brother    . Cancer Daughter     SOCIAL HISTORY: Social History   Social History  . Marital status: Married    Spouse name: Colen  . Number of children: 3  . Years of education: college   Occupational History  .      Retired   Social History Main Topics  . Smoking status: Never Smoker  . Smokeless tobacco: Never Used  . Alcohol use No  . Drug use: No  . Sexual activity: Not on file   Other Topics Concern  . 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 Patient has college education.    Right handed.   Caffeine- not every day . Soda or tea when she is out for dinner.     PHYSICAL EXAM  Vitals:   04/20/16 0924  BP: 113/65  Pulse: 79  Weight: 201 lb 9.6 oz (91.4 kg)   Body mass index is 31.58 kg/m.   Generalized: Well developed, obese female in no acute distress  Head: normocephalic and atraumatic,. Oropharynx benign  Neck: Supple, no carotid bruits  Cardiac: Regular rate rhythm, no murmur  Musculoskeletal: No deformity   Neurological examination   Mentation: Alert oriented to time, place, history taking. Attention span and concentration appropriate. Recent and remote memory intact.  Follows all commands speech and language fluent.   Cranial nerve II-XII:.Pupils were equal round reactive to light extraocular movements were full, visual field were full on confrontational test. Facial sensation and strength were normal. hearing was intact to finger rubbing bilaterally. Uvula tongue midline. head turning and shoulder shrug were normal and symmetric.Tongue protrusion into cheek strength was normal. Motor: She has moderate right ankle dorsiflexion weakness moderate right toe flexion extension weakness mild left ankle dorsiflexion weakness and toe flexion extension weakness  Sensory: Length dependent decreased pinprick and vibratory sensation to mid shin level Coordination: finger-nose-finger, heel-to-shin bilaterally, no  dysmetria Reflexes: Brachioradialis 2/2, biceps 2/2, triceps 2/2, patellar 2/2, Achilles 2/2, plantar responses were flexor bilaterally. Gait and Station: Rising up from seated position with push off, bilateral foot drop right worse than left ambulates with a single-point cane.     DIAGNOSTIC DATA (LABS, IMAGING, TESTING)    ASSESSMENT AND PLAN Tasha [name] is a 76 y.o. 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 She has distal leg muscle weakness, length dependent sensory changes, Localize the lesion to peripheral nervous system, hereditary vs acquired axonal peripheral  neuropathy, slow progressive course. She does not have any painful  or burning sensations. Reviewed recent  note last month from her visit to [facility] for CLL The patient is a current patient of Dr. Krista Blue  who is out of the office today . This note is sent to the work in doctor.      PLAN: Continue to use cane for ambulation Continue moderate exercise Call if neuropathy symptoms become painful F/U in 1 year Dennie Bible, [facility], [facility], [facility] [address] [place], [place] [address] [phone]

## 2016-04-20 NOTE — Patient Instructions (Addendum)
Continue to use cane for ambulation Continue moderate exercise Call if neuropathy symptoms become painful  F/U in 1 years

## 2016-04-22 DIAGNOSIS — H43812 Vitreous degeneration, left eye: Secondary | ICD-10-CM | POA: Diagnosis not present

## 2016-05-25 ENCOUNTER — Telehealth: Payer: Self-pay | Admitting: Neurology

## 2016-05-25 NOTE — Telephone Encounter (Signed)
She has been scheduled in an appt that opened up on 05/26/16.

## 2016-05-25 NOTE — Telephone Encounter (Signed)
Pt called she is having new onset since around the 2nd or 3rd week in March this year she began smelling a burning smell and a high pitched squeal. She said no one else can smell it or hear it in the household. An appt was scheduled for 8/13. Can she be worked in sooner, please call

## 2016-05-26 ENCOUNTER — Ambulatory Visit (INDEPENDENT_AMBULATORY_CARE_PROVIDER_SITE_OTHER): Payer: Medicare Other | Admitting: Neurology

## 2016-05-26 ENCOUNTER — Encounter: Payer: Self-pay | Admitting: Neurology

## 2016-05-26 VITALS — BP 125/67 | HR 97 | Ht 67.0 in | Wt 199.0 lb

## 2016-05-26 DIAGNOSIS — H9313 Tinnitus, bilateral: Secondary | ICD-10-CM

## 2016-05-26 DIAGNOSIS — R269 Unspecified abnormalities of gait and mobility: Secondary | ICD-10-CM | POA: Diagnosis not present

## 2016-05-26 DIAGNOSIS — Z1321 Encounter for screening for nutritional disorder: Secondary | ICD-10-CM | POA: Diagnosis not present

## 2016-05-26 DIAGNOSIS — R442 Other hallucinations: Secondary | ICD-10-CM | POA: Diagnosis not present

## 2016-05-26 DIAGNOSIS — H9319 Tinnitus, unspecified ear: Secondary | ICD-10-CM | POA: Insufficient documentation

## 2016-05-26 DIAGNOSIS — E559 Vitamin D deficiency, unspecified: Secondary | ICD-10-CM | POA: Diagnosis not present

## 2016-05-26 DIAGNOSIS — G609 Hereditary and idiopathic neuropathy, unspecified: Secondary | ICD-10-CM

## 2016-05-26 MED ORDER — DIVALPROEX SODIUM ER 250 MG PO TB24: 250.0000 mg | ORAL_TABLET | Freq: Every day | ORAL | 6 refills | Status: DC

## 2016-05-26 NOTE — Progress Notes (Signed)
[facility]  PATIENT: Tasha [name] DOB: [birth year]   REASON FOR VISIT: follow up for peripheral neuropathy, gait disorder HISTORY FROM:patient    HISTORY OF PRESENT ILLNESS: HISTORY: Tasha [name] is a 76 -year-old right-handed white married female with a history of right-sided weakness beginning in 1964. She was a patient of Dr. Erling Cruz.  In PJA,2505, She had numbness on right side of her body, lasting for 2 weeks, she was tested at [facility], including CSF, was diagnosed with possible MS. she has never received any disease modification therapy, MRI of the brain in 2000, again in 2010 showed no significant abnormality. MRI cervical spine showed no intrinsic cord lesion either.  In 1977, she has right side weakness, right face, hand, leg, lasting for few weeks recovered She began to dragging her right leg more since 2009, it is harder for her to walk. She was given right ankle brace,   She is still very independent, driving with out difficulty, no bladder and bowel incontinence Mother had trouble walking since age 91, very similar to her gait difficulty  She has intermittent neurologic symptoms and signs since that time, including occipital headaches, numbness in the face, Bell's palsy, unsteadiness with gait and a tendency to drag her right leg. She had a spinal fluid examination for MS which was negative.   MRI study of the brain 06/03/1998 showed no evidence of MS. Evaluation by Dr. Ron Parker 09/21/2003 for right seventh nerve weakness was thought to be a right Bell's palsy.   Lab evaluations in1/27/2010 including lupus anticoagulant, anticardiolipin antibodies, ANA, angiotensin-converting enzyme, B12, Lyme titer,and methylmalonic acid were normal.   MRIs of the cervical spine and brain in 02/28/2008, were unremarkable except for tiny nonspecific subcortical white matter intensities.  EMG and nerve conduction study in 06/05/1998 was negative. She has recurrent  numbness on the right side and top of her head to her neck, going around her mouth. This will resolve after hours she states she has recurrent attacks of her illness every 13 years since 1964.  She went 18 years without recurrent symptoms until September 2008 when she noticed balance problems that lasted until April 2011. She has vague numbness in her occiput when she bends forward. This occurs in the right occipital region. She notices that she drags her legs.She has fatigue and has to sit down. She has numbness in her feet extending to her knees. Her mother walked like the patient does. Her mother had pes cavus.   EMG/NCV in 05/30/2009 showed mild to moderate peripheral neuropathy. EMG of right leg showed distal chronic denervation, there is also evidence of lumbosacral paraspinal muscles denervation. There is also evidence of chronic distal left leg denervation Serum methylmalonic acid, 24-hour urine for heavy metals, RA factor, ACE, sedimentation rate, TSH, and SEP were normal. Vitamin D level was low, B12 was 164, and ANA was positive. She's been on supplemental vitamin D and B 12. serum B12 level folate, were normal in 10/03/2009. She has no change in symptomatology.  In 05/2010, she began to have increased gait difficulty with her left leg, it was felt this was a compensation of left knee pain because of right sided weakness. She has episodes of rapid heartbeat lasting as long as 30 minutes thought to represent atrial fibrillation, but Dr. Sallyanne Kuster monitored her for 4 weeks and thinks she has atrial tachycardia. Warfarin was discontinued due to sinus tachycardia, not afib.She is on 2 aspirin per day.She uses a cane and bilateral leg  braces with improvement in knee pain.   She is followed by Dr. Jovita Kussmaul  for peripheral neuropathy. She denies any falls in the last 6 months. She is ambulating with a cane, continues to drive without difficulty, continues to complain of balance issues. She  has a bilateral foot drop. She continues to have numbness but denies burning or painful sensations in her extremities She returns for reevaluation  UPDATE 04/02/2018CM [name], 76 year old female returns for follow-up for peripheral neuropathy. She has had one fall in the last 8 months. She has a bilateral foot drop, she has tried AFOs in the past but  she had more trouble walking with AFOs and she had lower extremity swelling. She continues to have numbness in both lower extremities but denies any painful sensations or burning sensations in her lower extremities. She continues to be followed at [facility] for CLL, Dr. Trilby Drummer. This was diagnosed in 2001. She denies any difficulty with driving or performing other tasks however she says it just takes her longer. She also has a history of atrial fibrillation and is on Metoprolol. . She returns for reevaluation. Her mother and grandmother and uncle had similar symptoms with their neuropathy  UPDATE May 26 2016: I reviewed her oncology note from [facility], chronic lymphocytic leukemia, Rai stage 0, first noticed elevated white count July 2001, cytometry positive for CLL in August 2001, has been under close observation was no treatment, her CLL is stable, requires no intervention.  She came in with new symptoms since March 2018,  She began to have burning smell, then turn into acid smell, melting electrical wires, she asked the service man to check out,  She also noticed tinnitus "like air coming out tire", varies intensity, especially early morning.    She also complains of paresthesia around her mouth, give her a headache.    She is raising 41 years old grandson.  Her son is alcoholic. She complains of worsening gait abnormality, fell in Dec 2017.   I reviewed the laboratory evaluation in March 2018, mild abnormal glucose 186, otherwise creatinine 0.8, normal CBC, hemoglobin of 13 point 8, WBC of 4.9, mild decreased platelet 102, mildly low IgG 459, IgM 11, normal  IgA 67,  normal LDH 181  REVIEW OF SYSTEMS: Full 14 system review of systems performed and notable only for those listed, all others are neg:       ALLERGIES: Allergies  Allergen Reactions  . Celebrex [Celecoxib] Other (See Comments)    Hypertension, poss TIA  . Statins Other (See Comments)    Muscle aches  . Tricor [Fenofibrate] Other (See Comments)    tricor - elevated liver enzymes    HOME MEDICATIONS: Outpatient Medications Prior to Visit  Medication Sig Dispense Refill  . aspirin 81 MG tablet Take 81 mg by mouth daily.    . cholecalciferol (VITAMIN D) 400 UNITS TABS Take 800 Units by mouth daily.    . Cyanocobalamin (VITAMIN B 12 PO) Take 1 tablet by mouth daily.    . metoprolol tartrate (LOPRESSOR) 25 MG tablet Take 12.5 mg by mouth 2 (two) times daily.    . montelukast (SINGULAIR) 10 MG tablet Take 10 mg by mouth daily.     No facility-administered medications prior to visit.     PAST MEDICAL HISTORY: Past Medical History:  Diagnosis Date  . Atrial fibrillation ([place])    questionable, echo 03/23/11 - EF >55%, on warfarin  . Bell's palsy   . 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 Gait disturbance   . Gallstones   . 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 IFG (impaired fasting glucose)    e  . MS (multiple sclerosis) ([place])   . Personal history of other diseases of circulatory system   . Tick bite   . Unspecified hereditary and idiopathic peripheral neuropathy   . Vitamin B12 deficiency   . Vitamin D deficiency     PAST SURGICAL HISTORY: Past Surgical History:  Procedure Laterality Date  . ABDOMINAL HYSTERECTOMY  1984   fibroids  . BREAST SURGERY  1960s   benign breast tumors  . CATARACT EXTRACTION W/ INTRAOCULAR LENS  IMPLANT, BILATERAL    . LAPAROSCOPIC BILATERAL SALPINGO OOPHERECTOMY  2003   ovarian cyst    FAMILY HISTORY: Family History  Problem Relation Age of Onset  .  Hypertension Mother   . Coronary artery disease Father     died at 65  . Hypertension Father   . Diabetes Father   . Coronary artery disease Brother     died at 39  . Diabetes Brother   . Hypertension Brother   . Cancer Daughter     SOCIAL HISTORY: Social History   Social History  . Marital status: Married    Spouse name: Colen  . Number of children: 3  . Years of education: college   Occupational History  .      Retired   Social History Main Topics  . Smoking status: Never Smoker  . Smokeless tobacco: Never Used  . Alcohol use No  . Drug use: No  . Sexual activity: Not on file   Other Topics Concern  . 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 Patient has college education.    Right handed.   Caffeine- not every day . Soda or tea when she is out for dinner.     PHYSICAL EXAM  Vitals:   05/26/16 1146  BP: 125/67  Pulse: 97  Weight: 199 lb (90.3 kg)  Height: 5\' 7"  (1.702 m)   Body mass index is 31.17 kg/m.   Generalized: Well developed, obese female in no acute distress  Head: normocephalic and atraumatic,. Oropharynx benign  Neck: Supple, no carotid bruits  Cardiac: Regular rate rhythm, no murmur  Musculoskeletal: No deformity   Neurological examination   Mentation: Alert oriented to time, place, history taking. Attention span and concentration appropriate. Recent and remote memory intact.  Follows all commands speech and language fluent.   Cranial nerve II-XII:.Pupils were equal round reactive to light extraocular movements were full, visual field were full on confrontational test. Facial sensation and strength were normal. hearing was intact to finger rubbing bilaterally. Uvula tongue midline. head turning and shoulder shrug were normal and symmetric.Tongue protrusion into cheek strength was normal. Motor: She has moderate right ankle dorsiflexion weakness moderate right toe flexion extension weakness  mild left ankle dorsiflexion weakness and toe flexion extension weakness  Sensory: Length dependent decreased pinprick and vibratory sensation to mid shin level Coordination: finger-nose-finger, heel-to-shin bilaterally, no dysmetria Reflexes: Brachioradialis 2/2, biceps 2/2, triceps 2/2, patellar 2/2, Achilles 2/2, plantar responses were flexor bilaterally. Gait and Station: Rising up from seated position with push off, bilateral foot drop right worse than left ambulates with a single-point cane.     DIAGNOSTIC DATA (LABS, IMAGING, TESTING)    ASSESSMENT AND PLAN [place] CLUM is a 77 y.o. Female  Gradual onset gait abnormality,  EMG nerve conduction study in 2011 showed moderate axonal peripheral neuropathy, evidence of active denervation at distal leg, length dependent sensory changes, distal leg weakness,  Localize the lesion to peripheral nervous system, possible hereditary peripheral neuropathy, no treatable etiology found,  New onset olfactory disturbances, tinnintus  MRI brain w/wo contrast with IAC to rule out structure lesion   Laboratory evaluation  Marcial Pacas, M.D. Ph.D.  [facility] [place], [place] [address] Phone: [phone] Fax:      [phone]

## 2016-05-27 LAB — ANA W/REFLEX IF POSITIVE: Anti Nuclear Antibody(ANA): NEGATIVE

## 2016-05-27 LAB — VITAMIN B12: VITAMIN B 12: 436 pg/mL (ref 232–1245)

## 2016-05-27 LAB — C-REACTIVE PROTEIN: CRP: 0.4 mg/L (ref 0.0–4.9)

## 2016-05-27 LAB — VITAMIN D 25 HYDROXY (VIT D DEFICIENCY, FRACTURES): VIT D 25 HYDROXY: 27.2 ng/mL — AB (ref 30.0–100.0)

## 2016-05-27 LAB — SEDIMENTATION RATE: Sed Rate: 2 mm/hr (ref 0–40)

## 2016-05-27 LAB — COPPER, SERUM: Copper: 111 ug/dL (ref 72–166)

## 2016-05-27 LAB — TSH: TSH: 0.912 u[IU]/mL (ref 0.450–4.500)

## 2016-06-04 DIAGNOSIS — R002 Palpitations: Secondary | ICD-10-CM | POA: Diagnosis not present

## 2016-06-04 DIAGNOSIS — R431 Parosmia: Secondary | ICD-10-CM | POA: Diagnosis not present

## 2016-06-04 DIAGNOSIS — R5383 Other fatigue: Secondary | ICD-10-CM | POA: Diagnosis not present

## 2016-06-04 DIAGNOSIS — R0600 Dyspnea, unspecified: Secondary | ICD-10-CM | POA: Diagnosis not present

## 2016-06-04 DIAGNOSIS — H9312 Tinnitus, left ear: Secondary | ICD-10-CM | POA: Diagnosis not present

## 2016-06-08 ENCOUNTER — Telehealth: Payer: Self-pay | Admitting: Cardiovascular Disease

## 2016-06-08 NOTE — Telephone Encounter (Signed)
Received records from [facility] for appointment on 07/30/16 with Dr Sallyanne Kuster.  Records put with Dr Croitoru's schedule for 07/30/16. lp

## 2016-06-11 ENCOUNTER — Ambulatory Visit
Admission: RE | Admit: 2016-06-11 | Discharge: 2016-06-11 | Disposition: A | Discharge status: Home / Self Care | Payer: Medicare Other | Source: Ambulatory Visit | Attending: Neurology | Admitting: Neurology

## 2016-06-11 DIAGNOSIS — R269 Unspecified abnormalities of gait and mobility: Secondary | ICD-10-CM | POA: Diagnosis not present

## 2016-06-11 DIAGNOSIS — H9313 Tinnitus, bilateral: Secondary | ICD-10-CM | POA: Diagnosis not present

## 2016-06-11 DIAGNOSIS — R442 Other hallucinations: Secondary | ICD-10-CM

## 2016-06-11 DIAGNOSIS — G609 Hereditary and idiopathic neuropathy, unspecified: Secondary | ICD-10-CM | POA: Diagnosis not present

## 2016-06-11 MED ORDER — GADOBENATE DIMEGLUMINE 529 MG/ML IV SOLN
18.0000 mL | Freq: Once | INTRAVENOUS | Status: AC | PRN
  Administered 2016-06-11: 18 mL via INTRAVENOUS

## 2016-06-15 ENCOUNTER — Emergency Department (HOSPITAL_COMMUNITY): Payer: Medicare Other

## 2016-06-15 ENCOUNTER — Encounter (HOSPITAL_COMMUNITY): Payer: Self-pay | Admitting: Emergency Medicine

## 2016-06-15 ENCOUNTER — Emergency Department (HOSPITAL_COMMUNITY)
Admission: EM | Admit: 2016-06-15 | Discharge: 2016-06-15 | Disposition: A | Discharge status: Home / Self Care | Payer: Medicare Other | Attending: Emergency Medicine | Admitting: Emergency Medicine

## 2016-06-15 DIAGNOSIS — R0602 Shortness of breath: Secondary | ICD-10-CM | POA: Diagnosis not present

## 2016-06-15 DIAGNOSIS — Z79899 Other long term (current) drug therapy: Secondary | ICD-10-CM | POA: Insufficient documentation

## 2016-06-15 DIAGNOSIS — Z7982 Long term (current) use of aspirin: Secondary | ICD-10-CM | POA: Diagnosis not present

## 2016-06-15 DIAGNOSIS — Z91040 Latex allergy status: Secondary | ICD-10-CM | POA: Diagnosis not present

## 2016-06-15 LAB — CBC
HCT: 40.2 % (ref 36.0–46.0)
Hemoglobin: 13.5 g/dL (ref 12.0–15.0)
MCH: 29 pg (ref 26.0–34.0)
MCHC: 33.6 g/dL (ref 30.0–36.0)
MCV: 86.5 fL (ref 78.0–100.0)
Platelets: 94 10*3/uL — ABNORMAL LOW (ref 150–400)
RBC: 4.65 MIL/uL (ref 3.87–5.11)
RDW: 14.8 % (ref 11.5–15.5)
WBC: 5.8 10*3/uL (ref 4.0–10.5)

## 2016-06-15 LAB — COMPREHENSIVE METABOLIC PANEL
ALBUMIN: 3.9 g/dL (ref 3.5–5.0)
ALT: 17 U/L (ref 14–54)
AST: 19 U/L (ref 15–41)
Alkaline Phosphatase: 67 U/L (ref 38–126)
Anion gap: 8 (ref 5–15)
BUN: 17 mg/dL (ref 6–20)
CHLORIDE: 107 mmol/L (ref 101–111)
CO2: 24 mmol/L (ref 22–32)
CREATININE: 0.63 mg/dL (ref 0.44–1.00)
Calcium: 9.2 mg/dL (ref 8.9–10.3)
GFR calc Af Amer: >60 mL/min (ref >60)
GLUCOSE: 154 mg/dL — AB (ref 65–99)
POTASSIUM: 3.6 mmol/L (ref 3.5–5.1)
Sodium: 139 mmol/L (ref 135–145)
Total Bilirubin: 0.6 mg/dL (ref 0.3–1.2)
Total Protein: 5.8 g/dL — ABNORMAL LOW (ref 6.5–8.1)

## 2016-06-15 LAB — BRAIN NATRIURETIC PEPTIDE: B Natriuretic Peptide: 44.1 pg/mL (ref 0.0–100.0)

## 2016-06-15 LAB — TROPONIN I

## 2016-06-15 NOTE — ED Provider Notes (Signed)
[name] DEPT Provider Note   CSN: [id number] Arrival date & time: 06/15/16  1443     History   Chief Complaint Chief Complaint  Patient presents with  . Shortness of Breath    HPI Tasha [name] is a 76 y.o. female.  Patient has a history of leukemia. Not on chemotherapy. She reports over the last couple days, she's had some mild shortness of breath. Today while she was eating a sandwich, she had abrupt onset of worsening shortness of breath. Her husband was present and took the patient's vital signs. The husband reports that the pulse ox reading is 82%, blood pressure systolic 193X, and heart rate in the 90s. Lasted approximately 6-8 minutes. Spontaneously resolved. EMS called. Started on a nonrebreather. Brought here for further evaluation. Denies any associated chest pain. Denies any fevers or chills or infectious symptoms. Denies any lower extremity swelling.   The history is provided by the patient and a relative.  Illness  This is a new problem. The current episode started less than 1 hour ago. The problem occurs constantly. The problem has been resolved. Associated symptoms include chest pain and shortness of breath. Pertinent negatives include no abdominal pain and no headaches. She has tried nothing for the symptoms.    Past Medical History:  Diagnosis Date  . Atrial fibrillation ([place])    questionable, echo 03/23/11 - EF >55%, on warfarin  . Bell's palsy   . 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 Gait disturbance   . Gallstones   . 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 IFG (impaired fasting glucose)    e  . MS (multiple sclerosis) ([place])   . Personal history of other diseases of circulatory system   . Tick bite   . Unspecified hereditary and idiopathic peripheral neuropathy   . Vitamin B12 deficiency   . Vitamin D deficiency     Patient Active Problem List   Diagnosis Date Noted  .  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 Atrial tachycardia ([place]) 03/23/2013  . Vitamin D deficiency   . Vitamin B12 deficiency   . Hereditary and idiopathic peripheral neuropathy   . Gait disturbance     Past Surgical History:  Procedure Laterality Date  . ABDOMINAL HYSTERECTOMY  1984   fibroids  . BREAST SURGERY  1960s   benign breast tumors  . CATARACT EXTRACTION W/ INTRAOCULAR LENS  IMPLANT, BILATERAL    . LAPAROSCOPIC BILATERAL SALPINGO OOPHERECTOMY  2003   ovarian cyst    OB History    No data available       Home Medications    Prior to Admission medications   Medication Sig Start Date End Date Taking? Authorizing Provider  aspirin 81 MG tablet Take 162 mg by mouth at bedtime.    Yes [provider]  cholecalciferol (VITAMIN D) 400 UNITS TABS Take 800 Units by mouth daily.   Yes [provider]  Cyanocobalamin (VITAMIN B 12 PO) Take 1 tablet by mouth daily.   Yes [provider]  metoprolol tartrate (LOPRESSOR) 25 MG tablet Take 12.5 mg by mouth 2 (two) times daily.   Yes [provider]  montelukast (SINGULAIR) 10 MG tablet Take 10 mg by mouth daily as needed (AS DIRECTED BY PHYSICIAN).  03/23/16 03/23/17 Yes [provider]  divalproex (DEPAKOTE ER) 250 MG 24 hr tablet Take 1 tablet (250 mg total) by mouth daily. Patient not taking: Reported  on 06/15/2016 05/26/16   Marcial Pacas, MD    Family History Family History  Problem Relation Age of Onset  . Hypertension Mother   . Coronary artery disease Father        died at 25  . Hypertension Father   . Diabetes Father   . Coronary artery disease Brother        died at 58  . Diabetes Brother   . Hypertension Brother   . Cancer Daughter     Social History Social History  Substance Use Topics  . Smoking status: Never Smoker  . Smokeless tobacco: Never Used  . Alcohol use No     Allergies   Celebrex [celecoxib]; Statins; Tape; Tricor [fenofibrate]; and  Latex   Review of Systems Review of Systems  Constitutional: Negative for chills and fever.  Respiratory: Positive for shortness of breath. Negative for cough.   Cardiovascular: Positive for chest pain. Negative for palpitations and leg swelling.  Gastrointestinal: Negative for abdominal pain, diarrhea, nausea and vomiting.  Musculoskeletal: Negative for back pain.  Neurological: Negative for light-headedness and headaches.  All other systems reviewed and are negative.    Physical Exam Updated Vital Signs BP (!) 106/58   Pulse 87   Resp 16   Ht 5\' 7"  (1.702 m)   Wt 90.3 kg (199 lb)   SpO2 95%   BMI 31.17 kg/m   Physical Exam  Constitutional: She appears well-developed and well-nourished. No distress.  HENT:  Head: Normocephalic and atraumatic.  Eyes: Conjunctivae are normal.  Neck: Neck supple.  Cardiovascular: Normal rate and regular rhythm.   No murmur heard. Pulmonary/Chest: Effort normal and breath sounds normal. No respiratory distress.  Abdominal: Soft. There is no tenderness.  Musculoskeletal: She exhibits no edema.  Neurological: She is alert.  Skin: Skin is warm and dry.  Psychiatric: She has a normal mood and affect.  Nursing note and vitals reviewed.    ED Treatments / Results  Labs (all labs ordered are listed, but only abnormal results are displayed) Labs Reviewed  CBC - Abnormal; Notable for the following:       Result Value   Platelets 94 (*)    All other components within normal limits  COMPREHENSIVE METABOLIC PANEL - Abnormal; Notable for the following:    Glucose, Bld 154 (*)    Total Protein 5.8 (*)    All other components within normal limits  TROPONIN I  BRAIN NATRIURETIC PEPTIDE    EKG  EKG Interpretation None       Radiology Dg Chest 2 View  Result Date: 06/15/2016 CLINICAL DATA:  Shortness of breath worsened today around noon, history hypertension, atrial fibrillation EXAM: CHEST  2 VIEW COMPARISON:  05/13/2009 FINDINGS:  Borderline enlargement of cardiac silhouette. Atherosclerotic calcification aorta. Mediastinal contours and pulmonary vascularity normal. Minimal chronic bronchitic changes. Subsegmental atelectasis and probable calcified granuloma at LEFT base. No definite infiltrate, pleural effusion or pneumothorax. Bones demineralized. 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 Aortic atherosclerosis. Electronically Signed   By: Lavonia Dana M.D.   On: 06/15/2016 15:49    Procedures Procedures (including critical care time)  Medications Ordered in ED Medications - No data to display   Initial Impression / Assessment and Plan / ED Course  I have reviewed the triage vital signs and the nursing notes.  Pertinent labs & imaging results that were available during my care of the patient were reviewed by me and considered in  my medical decision making (see chart for details).      Completely clear the cause of the patient's symptoms. The fact that they are completely resolved at this time, lower suspicion for an acute pathological process. The fact that she was eating when this all started, likely laryngospasm in nature. The fact that is transient, lower suspicion for pulmonary embolus. She denies any chest pain. EKG without ischemic changes or interval abnormalities. Troponin negative. Doubt ACS. Chest x-ray without evidence of pneumothorax or pneumonia. No evidence of fluid overload. She does not have a history of heart failure. Doubt flash pulmonary edema. Chest x-ray with narrow mediastinum. Doubt dissection.  Reevaluate the patient at bedside. Continues to be asymptomatic. Ambulate the patient in the hallways with a pulse ox without any desaturation on room air. We'll have the patient follow up with her primary care doctor soon as possible.  Final Clinical Impressions(s) / ED Diagnoses   Final diagnoses:  SOB (shortness of breath)     New Prescriptions Discharge Medication List as of 06/15/2016  5:14 PM       Maryan Puls, MD 06/16/16 [id number]    Jola Schmidt, MD 06/16/16 [phone]

## 2016-06-15 NOTE — ED Notes (Signed)
Pt able to ambulate while maintaining a pulse O2 above 95%

## 2016-06-15 NOTE — ED Triage Notes (Signed)
Per EMS, patient from home complaining of SOB.  Hx of leukemia, hypertension, and neuropathy.  Pt not on O2 at home.  SOB with exertion.  Has been intermittent x 2-3 weeks with orthopnea, occasional CP last only one minute and radiates to back.  No pain since 9am.  No swelling noticed.  Underlying stressors.  12Lead unremarkable.  No injury.  18G LAC.  119/67, 74 HR, 98% on 12 liters NRB.

## 2016-06-17 ENCOUNTER — Telehealth: Payer: Self-pay | Admitting: Neurology

## 2016-06-17 NOTE — Telephone Encounter (Signed)
I have spoken with Tasha [name] this afternoon and per YY, reviewed MRI result as below.  Extended, pleasant conversation.  She verbalized understanding of same.  Denies new sx. and will plan to discuss further with YY at August f/u appt.  If she has a new sx. before then, she will call for sooner appt./MRI/fim

## 2016-06-17 NOTE — Telephone Encounter (Signed)
Please call patient, MRI of the brain showed no significant change in the supratentorium lesions, but on one of the scan, there was a concern of a new upper cervical lesions, radiology has suggested MRI of cervical spine.  Please check with patient, it overall she has no significant change, we can hold off further evaluation, but if she has significant numbness tingling of lower extremity oh weakness, or increased gait abnormality,  We should go ahead  do MRI of cervical with without contrast  IMPRESSION:  This MRI of the brain with and without contrast with added attention to the internal auditory canals shows the following: 1.   A few scattered T2/FLAIR hyperintense foci in the subcortical white matter of the hemispheres consistent with minimal age-appropriate chronic microvascular ischemic change. 2.   On axial T2 weighted images but not confirmed on the FLAIR images, there is increased signal within the spinal cord at the cervicomedullary junction. As this is only observed on one sequence, artifact cannot be ruled out.   However, with a history of gait disturbance this could also represent a myelopathic focus.    Consider an MRI of the cervical spine to better evaluate the spinal cord,  3.   The internal auditory canals appear normal before and after contrast. 4.   There are no acute findings.

## 2016-07-30 ENCOUNTER — Encounter: Payer: Self-pay | Admitting: Cardiovascular Disease

## 2016-07-30 ENCOUNTER — Ambulatory Visit (INDEPENDENT_AMBULATORY_CARE_PROVIDER_SITE_OTHER): Payer: Medicare Other | Admitting: Cardiovascular Disease

## 2016-07-30 VITALS — BP 108/60 | HR 68 | Ht 67.0 in | Wt 196.0 lb

## 2016-07-30 DIAGNOSIS — I471 Supraventricular tachycardia: Secondary | ICD-10-CM

## 2016-07-30 MED ORDER — METOPROLOL TARTRATE 25 MG PO TABS: 12.5000 mg | ORAL_TABLET | Freq: Two times a day (BID) | ORAL | 3 refills | Status: DC

## 2016-07-30 MED ORDER — METOPROLOL TARTRATE 25 MG PO TABS: ORAL_TABLET | ORAL | 3 refills | Status: DC

## 2016-07-30 NOTE — Progress Notes (Signed)
Cardiology Consultation Note:    Date:  07/31/2016   ID:  Tasha [name], DOB 1940-02-17, MRN [id number]  PCP:  Tasha Orn, MD  Cardiologist:  Tasha Klein, MD    Referring MD: Tasha Orn, MD   Tasha [name] is a 76 y.o. female who is being seen today for the evaluation of Palpitations at the request of Tasha Orn, MD.   History of Present Illness:    Tasha [name] is a 76 y.o. female with a hx of CLL with mild thrombus cytopenia, Multiple sclerosis with very infrequent problems, ocular migraines and idiopathic peripheral neuropathy presents with complaints of palpitations. These have been associated with emotional distress surrounding a persistent electrical fire smell in her home. She has been unable to discover the source, although she presumes that the transformer outside her home. She gets very upset and frustrated since the problem cannot be resolved. When she gets upset she will often have abrupt onset of rapid palpitations and some shortness of breath. The palpitations also resolve rapidly. These have been managed for many years with a very low dose of metoprolol. Attempts at increasing the dose of metoprolol to alleviate her symptoms have led to symptomatic hypotension, as well as 70/50. She has also tried to discontinue the metoprolol, but this leads to rapid heartbeats associated with dyspnea.  She does not have known structural heart disease. She had a normal echo in 2013 at [facility] (normal LV systolic function, mild LVH with septal wall thickness 1.2 cm, borderline left atrial dilation at 3.9 cm, 25 cm). She does not have known coronary artery rest for problems.  Denies exertional angina or dyspnea, syncope or focal neurological events in the recent past, leg edema, claudication, easy bruising or bleeding, orthopnea or PND  Past Medical History:  Diagnosis Date  . Atrial fibrillation ([place])    questionable, echo 03/23/11 - EF >55%, on warfarin  . Bell's palsy   . Bouchard  nodes ([facility])   . 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 Gait disturbance   . Gallstones   . 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 IFG (impaired fasting glucose)    e  . MS (multiple sclerosis) ([place])   . Personal history of other diseases of circulatory system   . Tick bite   . Unspecified hereditary and idiopathic peripheral neuropathy   . Vitamin B12 deficiency   . Vitamin D deficiency     Past Surgical History:  Procedure Laterality Date  . ABDOMINAL HYSTERECTOMY  1984   fibroids  . BREAST SURGERY  1960s   benign breast tumors  . CATARACT EXTRACTION W/ INTRAOCULAR LENS  IMPLANT, BILATERAL    . LAPAROSCOPIC BILATERAL SALPINGO OOPHERECTOMY  2003   ovarian cyst    Current Medications: Current Meds  Medication Sig  . aspirin 81 MG tablet Take 162 mg by mouth at bedtime.   . cholecalciferol (VITAMIN D) 1000 units tablet Take 1,000 Units by mouth daily.  . Cyanocobalamin (VITAMIN B 12 PO) Take 100 mg by mouth daily.   . metoprolol tartrate (LOPRESSOR) 25 MG tablet Take 0.5 tablet (12.5mg  total) by mouth twice daily. May take an additional 0.5 tablet (12.5 mg total) daily as needed for palpitations.  . montelukast (SINGULAIR) 10 MG tablet Take 10 mg by mouth daily as needed (AS DIRECTED BY PHYSICIAN).   . [DISCONTINUED] cholecalciferol (VITAMIN D) 400 UNITS TABS Take 800 Units by mouth daily.  . [DISCONTINUED] divalproex (DEPAKOTE ER) 250 MG 24  hr tablet Take 1 tablet (250 mg total) by mouth daily.  . [DISCONTINUED] metoprolol tartrate (LOPRESSOR) 25 MG tablet Take 12.5 mg by mouth 2 (two) times daily.  . [DISCONTINUED] metoprolol tartrate (LOPRESSOR) 25 MG tablet Take 0.5 tablets (12.5 mg total) by mouth 2 (two) times daily. May take an additional 0.5 tablet (12.5 mg total) daily as needed for palpitations.     Allergies:   Celebrex [celecoxib]; Statins; Tape; Tricor [fenofibrate]; and Latex   Social History   Social History    . Marital status: Married    Spouse name: Tasha [name]  . Number of children: 3  . Years of education: college   Occupational History  .      Retired   Social History Main Topics  . Smoking status: Never Smoker  . Smokeless tobacco: Never Used  . Alcohol use No  . Drug use: No  . Sexual activity: Not Asked   Other Topics Concern  . 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 Patient has college education.    Right handed.   Caffeine- not every day . Soda or tea when she is out for dinner.     Family History: The patient's family history includes Cancer in her daughter; Coronary artery disease in her brother and father; Diabetes in her brother and father; Hypertension in her brother, father, and mother. ROS:   Please see the history of present illness.     All other systems reviewed and are negative.  EKGs/Labs/Other Studies Reviewed:    The following studies were reviewed today: Echo from 2013, notes from primary care provider and neurologist  EKG:  EKG is not ordered today.  The ekg ordered 06/15/2016 demonstrates normal sinus rhythm, minor T-wave flattening in leads V3-V6  Recent Labs: 05/26/2016: TSH 0.912 06/15/2016: ALT 17; B Natriuretic Peptide 44.1; BUN 17; Creatinine, Ser 0.63; Hemoglobin 13.5; Platelets 94; Potassium 3.6; Sodium 139  Recent Lipid Panel No results found for: CHOL, TRIG, HDL, CHOLHDL, VLDL, LDLCALC, LDLDIRECT  Physical Exam:    VS:  BP 108/60   Pulse 68   Ht 5\' 7"  (1.702 m)   Wt 196 lb (88.9 kg)   BMI 30.70 kg/m     Wt Readings from Last 3 Encounters:  07/30/16 196 lb (88.9 kg)  06/15/16 199 lb (90.3 kg)  05/26/16 199 lb (90.3 kg)     GEN:  Well nourished, well developed in no acute distress HEENT: Normal NECK: No JVD; No carotid bruits LYMPHATICS: No lymphadenopathy CARDIAC: RRR, no murmurs, rubs, gallops RESPIRATORY:  Clear to auscultation without rales, wheezing or rhonchi  ABDOMEN: Soft,  non-tender, non-distended MUSCULOSKELETAL:  No edema; No deformity  SKIN: Warm and dry NEUROLOGIC:  Alert and oriented x 3 PSYCHIATRIC:  Normal affect   ASSESSMENT:    No diagnosis found. PLAN:    In order of problems listed above:  1. PAT: Diagnosis based on previous reports, no firm documentation found. Increase frequency of symptoms seems to be related to her anxiety and frustration related to the unusual smell in her house. Symptoms respond well to beta blockers, but the dose has been limited by blood pressure. I think she should continue to dose of metoprolol that she is taking currently but she can take an additional 12.5 mg dose as needed for increased frequency of palpitations. Unlikely to be a serious arrhythmia. Consider using a home monitoring device such as Kardia. Absent new development, I'm not sure further workup  would be beneficial.   Medication Adjustments/Labs and Tests Ordered: Current medicines are reviewed at length with the patient today.  Concerns regarding medicines are outlined above.  No orders of the defined types were placed in this encounter.  Meds ordered this encounter  Medications  . DISCONTD: metoprolol tartrate (LOPRESSOR) 25 MG tablet    Sig: Take 0.5 tablets (12.5 mg total) by mouth 2 (two) times daily. May take an additional 0.5 tablet (12.5 mg total) daily as needed for palpitations.    Dispense:  45 tablet    Refill:  3    Future refills must come from PCP.  Marland Kitchen metoprolol tartrate (LOPRESSOR) 25 MG tablet    Sig: Take 0.5 tablet (12.5mg  total) by mouth twice daily. May take an additional 0.5 tablet (12.5 mg total) daily as needed for palpitations.    Dispense:  45 tablet    Refill:  3    Future refills must come from PCP.    Signed, Tasha Klein, MD  07/31/2016 4:08 PM    [facility]

## 2016-07-30 NOTE — Patient Instructions (Signed)
Dr Sallyanne Kuster recommends that you follow-up with him as needed.  It is okay for you to take an additional 12.5 mg of Metoprolol as needed for palpitations.

## 2016-08-06 ENCOUNTER — Other Ambulatory Visit: Payer: Self-pay | Admitting: Internal Medicine

## 2016-08-06 DIAGNOSIS — R5383 Other fatigue: Secondary | ICD-10-CM | POA: Diagnosis not present

## 2016-08-06 DIAGNOSIS — R1084 Generalized abdominal pain: Secondary | ICD-10-CM

## 2016-08-06 DIAGNOSIS — R1013 Epigastric pain: Secondary | ICD-10-CM | POA: Diagnosis not present

## 2016-08-07 DIAGNOSIS — R1013 Epigastric pain: Secondary | ICD-10-CM | POA: Diagnosis not present

## 2016-08-10 ENCOUNTER — Ambulatory Visit
Admission: RE | Admit: 2016-08-10 | Discharge: 2016-08-10 | Disposition: A | Discharge status: Home / Self Care | Payer: Medicare Other | Source: Ambulatory Visit | Attending: Internal Medicine | Admitting: Internal Medicine

## 2016-08-10 DIAGNOSIS — R1011 Right upper quadrant pain: Secondary | ICD-10-CM | POA: Diagnosis not present

## 2016-08-10 DIAGNOSIS — K8020 Calculus of gallbladder without cholecystitis without obstruction: Secondary | ICD-10-CM | POA: Diagnosis not present

## 2016-08-10 DIAGNOSIS — R1084 Generalized abdominal pain: Secondary | ICD-10-CM

## 2016-08-26 DIAGNOSIS — L270 Generalized skin eruption due to drugs and medicaments taken internally: Secondary | ICD-10-CM | POA: Diagnosis not present

## 2016-08-31 ENCOUNTER — Ambulatory Visit (INDEPENDENT_AMBULATORY_CARE_PROVIDER_SITE_OTHER): Payer: Medicare Other | Admitting: Neurology

## 2016-08-31 ENCOUNTER — Encounter: Payer: Self-pay | Admitting: Neurology

## 2016-08-31 VITALS — BP 142/68 | HR 78 | Ht 67.0 in

## 2016-08-31 DIAGNOSIS — G609 Hereditary and idiopathic neuropathy, unspecified: Secondary | ICD-10-CM

## 2016-08-31 DIAGNOSIS — R269 Unspecified abnormalities of gait and mobility: Secondary | ICD-10-CM

## 2016-08-31 NOTE — Progress Notes (Signed)
[facility]  PATIENT: Tasha [name] DOB: [birth year]   REASON FOR VISIT: follow up for peripheral neuropathy, gait disorder HISTORY FROM:patient    HISTORY OF PRESENT ILLNESS: HISTORY: Tasha [name] is a 76 -year-old right-handed white married female with a history of right-sided weakness beginning in 1964. She was a patient of Dr. Erling Cruz.  In XTG,6269, She had numbness on right side of her body, lasting for 2 weeks, she was tested at [facility], including CSF, was diagnosed with possible MS. she has never received any disease modification therapy, MRI of the brain in 2000, again in 2010 showed no significant abnormality. MRI cervical spine showed no intrinsic cord lesion either.  In 1977, she has right side weakness, right face, hand, leg, lasting for few weeks recovered She began to dragging her right leg more since 2009, it is harder for her to walk. She was given right ankle brace,   She is still very independent, driving with out difficulty, no bladder and bowel incontinence Mother had trouble walking since age 11, very similar to her gait difficulty  She has intermittent neurologic symptoms and signs since that time, including occipital headaches, numbness in the face, Bell's palsy, unsteadiness with gait and a tendency to drag her right leg. She had a spinal fluid examination for MS which was negative.   MRI study of the brain 06/03/1998 showed no evidence of MS. Evaluation by Dr. Ron Parker 09/21/2003 for right seventh nerve weakness was thought to be a right Bell's palsy.   Lab evaluations in1/27/2010 including lupus anticoagulant, anticardiolipin antibodies, ANA, angiotensin-converting enzyme, B12, Lyme titer,and methylmalonic acid were normal.   MRIs of the cervical spine and brain in 02/28/2008, were unremarkable except for tiny nonspecific subcortical white matter intensities.  EMG and nerve conduction study in 06/05/1998 was negative. She has recurrent  numbness on the right side and top of her head to her neck, going around her mouth. This will resolve after hours she states she has recurrent attacks of her illness every 13 years since 1964.   She went 18 years without recurrent symptoms until September 2008 when she noticed balance problems that lasted until April 2011. She has vague numbness in her occiput when she bends forward. This occurs in the right occipital region. She notices that she drags her legs.She has fatigue and has to sit down. She has numbness in her feet extending to her knees. Her mother walked like the patient does. Her mother had pes cavus.   EMG/NCV in 05/30/2009 showed mild to moderate peripheral neuropathy. EMG of right leg showed distal chronic denervation, there is also evidence of lumbosacral paraspinal muscles denervation. There is also evidence of chronic distal left leg denervation  Serum methylmalonic acid, 24-hour urine for heavy metals, RA factor, ACE, sedimentation rate, TSH, and SEP were normal. Vitamin D level was low, B12 was 164, and ANA was positive. She's been on supplemental vitamin D and B 12. serum B12 level folate, were normal in 10/03/2009. She has no change in symptomatology.  In 05/2010, she began to have increased gait difficulty with her left leg, it was felt this was a compensation of left knee pain because of right sided weakness. She has episodes of rapid heartbeat lasting as long as 30 minutes thought to represent atrial fibrillation, but Dr. Sallyanne Kuster monitored her for 4 weeks and thinks she has atrial tachycardia. Warfarin was discontinued due to sinus tachycardia, not afib.She is on 2 aspirin per day.She uses a cane and  bilateral leg braces with improvement in knee pain.   She is followed by Dr. Jovita Kussmaul  for peripheral neuropathy. She denies any falls in the last 6 months. She is ambulating with a cane, continues to drive without difficulty, continues to complain of balance issues.  She has a bilateral foot drop. She continues to have numbness but denies burning or painful sensations in her extremities She returns for reevaluation  UPDATE May 26 2016: I reviewed her oncology note from [facility], chronic lymphocytic leukemia, Rai stage 0, first noticed elevated white count July 2001, cytometry positive for CLL in August 2001, has been under close observation was no treatment, her CLL is stable, requires no intervention.  She came in with new symptoms since March 2018,  She began to have burning smell, then turn into acid smell, melting electrical wires, she asked the service man to check out,  She also noticed tinnitus "like air coming out tire", varies intensity, especially early morning.    She also complains of paresthesia around her mouth, give her a headache.    She is raising 64 years old grandson.  Her son is alcoholic. She complains of worsening gait abnormality, fell in Dec 2017.   I reviewed the laboratory evaluation in March 2018, mild abnormal glucose 186, otherwise creatinine 0.8, normal CBC, hemoglobin of 13 point 8, WBC of 4.9, mild decreased platelet 102, mildly low IgG 459, IgM 11, normal IgA 67,  normal LDH 181  UPDATE August 31 2016: Her tinnitus has much improved, she attributed it to the environmental factor, she found black mold  She continues to have gait difficulty, she has more numbness in her feet and distal legs, mild right hand paresthesia,  Her mother also had gradual onset gait difficulty since age 73s.  Her great grandmother had similar gait abnormalities, leg atrophy in her old age.  Maternal grandmother has mild memory loss.  I have personally reviewed MRI brain in May 2018, there is generalized atrophy, only a few supratentorium small vessel disease  She is raising her 88 years old grandson.  REVIEW OF SYSTEMS: Full 14 system review of systems performed and notable only for those listed, all others are neg:    Fatigue, headache, numbness,  weakness, walking difficulty, frequent awakening, rash  ALLERGIES: Allergies  Allergen Reactions  . Celebrex [Celecoxib] Other (See Comments)    Hypertension, possible TIA  . Statins Other (See Comments)    Muscle aches  . Tape     Patient CAN tolerate Coban Wrap only (NO TAPE!!)  . Tricor [Fenofibrate] Other (See Comments)    Elevated liver enzymes  . Amoxicillin Rash  . Clarithromycin Rash  . Latex Rash    HOME MEDICATIONS: Outpatient Medications Prior to Visit  Medication Sig Dispense Refill  . aspirin 81 MG tablet Take 162 mg by mouth at bedtime.     . cholecalciferol (VITAMIN D) 1000 units tablet Take 1,000 Units by mouth daily.    . Cyanocobalamin (VITAMIN B 12 PO) Take 100 mg by mouth daily.     . metoprolol tartrate (LOPRESSOR) 25 MG tablet Take 0.5 tablet (12.5mg  total) by mouth twice daily. May take an additional 0.5 tablet (12.5 mg total) daily as needed for palpitations. 45 tablet 3  . montelukast (SINGULAIR) 10 MG tablet Take 10 mg by mouth daily as needed (AS DIRECTED BY PHYSICIAN).      No facility-administered medications prior to visit.     PAST MEDICAL HISTORY: Past Medical History:  Diagnosis Date  .  Atrial fibrillation ([place])    questionable, echo 03/23/11 - EF >55%, on warfarin  . Bell's palsy   . 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 Gait disturbance   . Gallstones   . 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 IFG (impaired fasting glucose)    e  . MS (multiple sclerosis) ([place])   . Personal history of other diseases of circulatory system   . Tick bite   . Unspecified hereditary and idiopathic peripheral neuropathy   . Vitamin B12 deficiency   . Vitamin D deficiency     PAST SURGICAL HISTORY: Past Surgical History:  Procedure Laterality Date  . ABDOMINAL HYSTERECTOMY  1984   fibroids  . BREAST SURGERY  1960s   benign breast tumors  . CATARACT EXTRACTION W/ INTRAOCULAR  LENS  IMPLANT, BILATERAL    . LAPAROSCOPIC BILATERAL SALPINGO OOPHERECTOMY  2003   ovarian cyst    FAMILY HISTORY: Family History  Problem Relation Age of Onset  . Hypertension Mother   . Coronary artery disease Father        died at 53  . Hypertension Father   . Diabetes Father   . Coronary artery disease Brother        died at 51  . Diabetes Brother   . Hypertension Brother   . Cancer Daughter     SOCIAL HISTORY: Social History   Social History  . Marital status: Married    Spouse name: Colen  . Number of children: 3  . Years of education: college   Occupational History  .      Retired   Social History Main Topics  . Smoking status: Never Smoker  . Smokeless tobacco: Never Used  . Alcohol use No  . Drug use: No  . Sexual activity: Not on file   Other Topics Concern  . 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 Patient has college education.    Right handed.   Caffeine- not every day . Soda or tea when she is out for dinner.     PHYSICAL EXAM  Vitals:   08/31/16 0801  BP: (!) 142/68  Pulse: 78  Height: 5\' 7"  (1.702 m)   There is no height or weight on file to calculate BMI.   Generalized: Well developed, obese female in no acute distress  Head: normocephalic and atraumatic,. Oropharynx benign  Neck: Supple, no carotid bruits  Cardiac: Regular rate rhythm, no murmur  Musculoskeletal: No deformity   Neurological examination   Mentation: Alert oriented to time, place, history taking. Attention span and concentration appropriate. Recent and remote memory intact.  Follows all commands speech and language fluent.   Cranial nerve II-XII:.Pupils were equal round reactive to light extraocular movements were full, visual field were full on confrontational test. Facial sensation and strength were normal. hearing was intact to finger rubbing bilaterally. Uvula tongue midline. head turning and shoulder shrug were  normal and symmetric.Tongue protrusion into cheek strength was normal. Motor: She has moderate right ankle dorsiflexion weakness moderate right toe flexion extension weakness mild left ankle dorsiflexion weakness and toe flexion extension weakness  Sensory: Length dependent decreased pinprick and vibratory sensation to mid shin level Coordination: finger-nose-finger, heel-to-shin bilaterally, no dysmetria Reflexes: Brachioradialis 2/2, biceps 2/2, triceps 2/2, patellar 2/2, Achilles 2/2, plantar responses were flexor bilaterally. Gait and Station: Rising up from seated position with push off, bilateral  foot drop right worse than left ambulates with a single-point cane.   DIAGNOSTIC DATA (LABS, IMAGING, TESTING)  ASSESSMENT AND PLAN Tasha [name] is a 76 y.o. Female Gradual onset gait abnormality,  EMG nerve conduction study in 2011 showed moderate axonal peripheral neuropathy, evidence of active denervation at distal leg, length dependent sensory changes, distal leg weakness,  Localize the lesion to peripheral nervous system, possible hereditary peripheral neuropathy,her mother, great-grandmother also suffers similar gait abnormality since middle-age, no treatable etiology found,  I have written prescription for bilateral ankle brace  Marcial Pacas, M.D. Ph.D.  [facility] [place], [place] [address] Phone: [phone] Fax:      [phone]

## 2016-09-22 DIAGNOSIS — K279 Peptic ulcer, site unspecified, unspecified as acute or chronic, without hemorrhage or perforation: Secondary | ICD-10-CM | POA: Diagnosis not present

## 2016-11-03 DIAGNOSIS — Z0000 Encounter for general adult medical examination without abnormal findings: Secondary | ICD-10-CM | POA: Diagnosis not present

## 2016-11-03 DIAGNOSIS — Z23 Encounter for immunization: Secondary | ICD-10-CM | POA: Diagnosis not present

## 2016-11-03 DIAGNOSIS — K2970 Gastritis, unspecified, without bleeding: Secondary | ICD-10-CM | POA: Diagnosis not present

## 2016-11-03 DIAGNOSIS — C9110 Chronic lymphocytic leukemia of B-cell type not having achieved remission: Secondary | ICD-10-CM | POA: Diagnosis not present

## 2016-11-03 DIAGNOSIS — I471 Supraventricular tachycardia: Secondary | ICD-10-CM | POA: Diagnosis not present

## 2016-11-03 DIAGNOSIS — Z1389 Encounter for screening for other disorder: Secondary | ICD-10-CM | POA: Diagnosis not present

## 2016-11-03 DIAGNOSIS — B9681 Helicobacter pylori [H. pylori] as the cause of diseases classified elsewhere: Secondary | ICD-10-CM | POA: Diagnosis not present

## 2016-11-03 DIAGNOSIS — G609 Hereditary and idiopathic neuropathy, unspecified: Secondary | ICD-10-CM | POA: Diagnosis not present

## 2016-11-24 DIAGNOSIS — H524 Presbyopia: Secondary | ICD-10-CM | POA: Diagnosis not present

## 2016-11-24 DIAGNOSIS — Z961 Presence of intraocular lens: Secondary | ICD-10-CM | POA: Diagnosis not present

## 2017-03-22 DIAGNOSIS — E785 Hyperlipidemia, unspecified: Secondary | ICD-10-CM | POA: Diagnosis not present

## 2017-03-22 DIAGNOSIS — Z9889 Other specified postprocedural states: Secondary | ICD-10-CM | POA: Diagnosis not present

## 2017-03-22 DIAGNOSIS — T148XXD Other injury of unspecified body region, subsequent encounter: Secondary | ICD-10-CM | POA: Diagnosis not present

## 2017-03-22 DIAGNOSIS — M8580 Other specified disorders of bone density and structure, unspecified site: Secondary | ICD-10-CM | POA: Diagnosis not present

## 2017-03-22 DIAGNOSIS — Z90710 Acquired absence of both cervix and uterus: Secondary | ICD-10-CM | POA: Diagnosis not present

## 2017-03-22 DIAGNOSIS — C9110 Chronic lymphocytic leukemia of B-cell type not having achieved remission: Secondary | ICD-10-CM | POA: Diagnosis not present

## 2017-03-22 DIAGNOSIS — G510 Bell's palsy: Secondary | ICD-10-CM | POA: Diagnosis not present

## 2017-03-22 DIAGNOSIS — G629 Polyneuropathy, unspecified: Secondary | ICD-10-CM | POA: Diagnosis not present

## 2017-03-22 DIAGNOSIS — I4891 Unspecified atrial fibrillation: Secondary | ICD-10-CM | POA: Diagnosis not present

## 2017-03-22 DIAGNOSIS — G35 Multiple sclerosis: Secondary | ICD-10-CM | POA: Diagnosis not present

## 2017-03-22 DIAGNOSIS — D801 Nonfamilial hypogammaglobulinemia: Secondary | ICD-10-CM | POA: Diagnosis not present

## 2017-03-22 DIAGNOSIS — G609 Hereditary and idiopathic neuropathy, unspecified: Secondary | ICD-10-CM | POA: Diagnosis not present

## 2017-03-22 DIAGNOSIS — C9190 Lymphoid leukemia, unspecified not having achieved remission: Secondary | ICD-10-CM | POA: Diagnosis not present

## 2017-04-22 ENCOUNTER — Ambulatory Visit: Payer: Medicare Other | Admitting: Nurse Practitioner

## 2017-05-22 ENCOUNTER — Other Ambulatory Visit: Payer: Self-pay | Admitting: Cardiovascular Disease

## 2017-05-24 NOTE — Telephone Encounter (Signed)
REFILL 

## 2017-06-28 ENCOUNTER — Encounter: Payer: Self-pay | Admitting: Cardiovascular Disease

## 2017-06-28 DIAGNOSIS — L749 Eccrine sweat disorder, unspecified: Secondary | ICD-10-CM | POA: Diagnosis not present

## 2017-06-28 DIAGNOSIS — R0789 Other chest pain: Secondary | ICD-10-CM | POA: Diagnosis not present

## 2017-06-28 DIAGNOSIS — R5383 Other fatigue: Secondary | ICD-10-CM | POA: Diagnosis not present

## 2017-06-29 ENCOUNTER — Telehealth: Payer: Self-pay

## 2017-06-29 ENCOUNTER — Encounter: Payer: Self-pay | Admitting: Cardiovascular Disease

## 2017-06-29 DIAGNOSIS — R0602 Shortness of breath: Secondary | ICD-10-CM

## 2017-06-29 DIAGNOSIS — R072 Precordial pain: Secondary | ICD-10-CM

## 2017-06-29 NOTE — Telephone Encounter (Signed)
CTA ordered. Labs requested. Patient will take 2 25 mg tablets on the day of her test once it is scheduled.

## 2017-06-29 NOTE — Telephone Encounter (Signed)
-----   Message from Sanda Klein, MD sent at 06/29/2017  9:04 AM EDT ----- Please schedule for coronary CT angiogram for chest pain shortness of breath and subsequent follow-up in clinic.  Probably can get labs from Dr. Laurann Montana performed just yesterday.  Take metoprolol 50 mg on day of procedure. MCr

## 2017-06-29 NOTE — Progress Notes (Unsigned)
Tasha [name] saw Dr. Lavone Orn in the office to discuss recent symptoms. She has had an upper respiratory infection for about a week, which she thinks she caught from her daughter and has been feeling poorly during that time.  She had repeated episodes of weakness and documented hypotension.  During light physical activity such as doing housework she will experience chest tightness and shortness of breath, which are new findings for her.  She has also had repeated episodes of unexplained diaphoresis, which she initially attributed to the warm weather, but which has persisted even after the weather cooled down.  She has not expressed full-blown syncope and denies orthopnea, PND or leg edema.  Her systolic blood pressure has been as low as 90 mmHg. Dr. Laurann Montana told me that her exam was unrevealing in the office yesterday.  Her ECG was reported as normal.  Lab tests were performed due to concern that her symptoms could be related to worsening of CLL, but she was not anemic and there was really no change from her baseline labs. I asked Tasha [name] to stop taking her twice daily metoprolol, since this is probably contributing to hypotension.  Encourage her to increase her intake of fluids and liberalize her salt at least temporarily. We will schedule for a coronary CT angiogram for chest pain and shortness of breath.  Follow-up in the office after that.  Advised her to go to the emergency room for any chest tightness that lasts for more than 30 minutes.  She is already taking aspirin.  Sanda Klein, MD, [facility] [phone] office [phone] pager

## 2017-07-14 DIAGNOSIS — C9110 Chronic lymphocytic leukemia of B-cell type not having achieved remission: Secondary | ICD-10-CM | POA: Insufficient documentation

## 2017-07-16 ENCOUNTER — Other Ambulatory Visit: Payer: Self-pay

## 2017-07-16 DIAGNOSIS — C9110 Chronic lymphocytic leukemia of B-cell type not having achieved remission: Secondary | ICD-10-CM

## 2017-07-18 NOTE — Progress Notes (Addendum)
[place]  [phone] [phone]     ID: Tasha [name] DOB: 1940/03/27  MR#: [id number]  ZHY#:[id number]  Patient Care Team: Lavone Orn, MD as PCP - General (Internal Medicine) Sanda Klein, MD as Consulting Physician (Cardiology) Desiderio Dolata, Virgie Dad, MD as Consulting Physician (Oncology) OTHER MD:  CHIEF COMPLAINT: Chronic Lymphoid Leukemia  CURRENT TREATMENT: Observation   HISTORY OF CURRENT ILLNESS: Tasha [name] has been following up with Dr. Sabas Sous at [facility]. From Dr. Tawanna Sat 03/23/2017 note:  Tasha [name] is a 77 y.o. [place] woman with chronic lymphocytic leukemia diagnosed in 2001, Rai stage 0. She has been followed at [name] by Dr. Sabas Sous at [facility], who is retiring (the end of an era).  Dr. Laurance Flatten was seeing Tasha [name] on a once a year basis.  She has not required treatment to date.  Most recent labs obtained at [facility] 03/22/2017, showed a hemoglobin of 13.8, MCV 88, white cell count 6.1, platelets 101,000, unchanged since at least August 2007); with IgA 51, IgG 421, and IgM 11.  (The IgG has dropped from 642 August 2010 to 421 March 2019).  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  Beta-2 microglobulin obtained 11/30/2000 was not elevated at 1.9.  Sed rate on the same date was 4  The patient's subsequent history is as detailed below.  INTERVAL HISTORY: Tasha [name] was evaluated in the hematology clinic on 07/19/2017.  She notes that she recently had drenching sweats, for about 2 weeks, but she attributes this to an upper respiratory infection. She also experiences peripheral neuropathy in both her feet and lower legs. This causes her to be unbalanced, and she  had a  few prior  falls, but the last was 2 years ago. She also has neuropathy in her right hand that occurs intermittently at no specific time. She denies numbness in the hands. She denies having unexplained weight loss, unexplained fatigue, fevers, rash or lymphadenopathy.    REVIEW OF SYSTEMS: Tasha [name] has arthritis in her hands. She denies unusual headaches, visual changes, nausea, vomiting, or dizziness. There has been no unusual cough, phlegm production, or pleurisy. This been no change in bowel or bladder habits. She denies unexplained fatigue or unexplained weight loss, bleeding, rash, or fever. A detailed review of systems was otherwise stable. '  PAST MEDICAL HISTORY: Past Medical History:  Diagnosis Date  . Atrial fibrillation ([place])    questionable, echo 03/23/11 - EF >55%, on warfarin  . Bell's palsy   . 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 Gait disturbance   . Gallstones   . 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 IFG (impaired fasting glucose)    e  . MS (multiple sclerosis) ([place])   . Personal history of other diseases of circulatory system   . Tick bite   . Unspecified hereditary and idiopathic peripheral neuropathy   . Vitamin B12 deficiency   . Vitamin D deficiency   Charcoid-Marie's tooth Disease  PAST SURGICAL HISTORY: Past Surgical History:  Procedure Laterality Date  . ABDOMINAL HYSTERECTOMY  1984   fibroids  . BREAST SURGERY  1960s   benign breast tumors  . CATARACT EXTRACTION W/ INTRAOCULAR LENS  IMPLANT, BILATERAL    .  LAPAROSCOPIC BILATERAL SALPINGO OOPHERECTOMY  2003   ovarian cyst  Tonsillectomy as a child.   FAMILY HISTORY Family History  Problem Relation Age of Onset  . Hypertension Mother   . Coronary artery disease Father        died at 34  . Hypertension Father   . Diabetes Father   . Coronary artery disease Brother        died at 16  . Diabetes Brother   . Hypertension  Brother   . Cancer Daughter   The patient's mother died at age 80 due to stroke and neurological problems similar to the patient's. The patient's father died at age 67 due to heart problems and diabetes. The patient had 1 brother who died with heart problems. The patient has 2 sisters, one of whom has diabetes. There was a maternal grandfather with prostate cancer.  The patient denies any other cancers such as lymphoma or myeloma in the family.   GYNECOLOGIC HISTORY:  No LMP recorded. Patient has had a hysterectomy. Menarche: 77 years old Age at first live birth: 77 years old She is GXP3.  LMP: That is post hysterectomy in 1984 fibroids. She used HRT for less than 2 years with no complications.    SOCIAL HISTORY:  Tasha [name] retired in 2001 due to her CLL. She worked at [employer] in [employer]. She and her husband, Tasha [name] have been married for 54 years as of June 2019. Tasha [name] used to work for [employer], and now he is a Psychologist, occupational at the [employer], trade union. The patient's oldest daughter, Tasha [name], works at [employer] in the billing department. The patient's second daughter is Tasha [name], who is a principal at [employer]. The patient's son, Tasha [name] struggles to keep jobs. At home, is the patient, her husband, and her grandson, 62 y/o Tasha [name] (Tasha [name]'s son). Tasha [name]'s mother is "not in his life". The patient attends [employer].       ADVANCED DIRECTIVES:    HEALTH MAINTENANCE: Social History   Tobacco Use  . Smoking status: Never Smoker  . Smokeless tobacco: Never Used  Substance Use Topics  . Alcohol use: No    Alcohol/week: 0.0 oz  . Drug use: No     Colonoscopy: refuses   PAP: s/p hysterectomy  Bone density: 2018 was normal  Mammography: "no more" after 2017    Allergies  Allergen Reactions  . Clindamycin Itching and Rash  . Celebrex [Celecoxib] Other (See Comments)    Hypertension, possible TIA  . Statins Other (See Comments)    Muscle  aches  . Tape     Patient CAN tolerate Coban Wrap only (NO TAPE!!)  . Tricor [Fenofibrate] Other (See Comments)    Elevated liver enzymes  . Amoxicillin Rash  . Clarithromycin Rash  . Latex Rash    Current Outpatient Medications  Medication Sig Dispense Refill  . aspirin 81 MG tablet Take 162 mg by mouth at bedtime.     . cholecalciferol (VITAMIN D) 1000 units tablet Take 1,000 Units by mouth daily.    . Cyanocobalamin (VITAMIN B 12 PO) Take 100 mg by mouth daily.     . metoprolol tartrate (LOPRESSOR) 25 MG tablet TAKE 1/2 TABLET BY MOUTH TWICE DAILY, MAY TAKE AN ADDITIONAL 1/2 TABLET EVERY DAY AS NEEDED FOR PALPITATIONS (Patient not taking: Reported on 07/19/2017) 45 tablet 0  . montelukast (SINGULAIR) 10 MG tablet Take 10 mg by mouth daily as needed (AS DIRECTED BY PHYSICIAN).  No current facility-administered medications for this visit.     OBJECTIVE: Older white woman using a cane to ambulate  Vitals:   07/19/17 1600  BP: 129/61  Pulse: 79  Resp: 18  Temp: 98.8 F (37.1 C)  SpO2: 97%     Body mass index is 30.43 kg/m.   Wt Readings from Last 3 Encounters:  07/19/17 194 lb 4.8 oz (88.1 kg)  07/30/16 196 lb (88.9 kg)  06/15/16 199 lb (90.3 kg)      ECOG FS:1 - Symptomatic but completely ambulatory  Ocular: Sclerae unicteric, EOMs intact Lymphatic: No cervical or supraclavicular adenopathy, no axillary or inguinal adenopathy Lungs no rales or rhonchi Heart regular rate and rhythm  Abd soft, nontender, positive bowel sounds, no palpable spleen MSK no focal spinal tenderness, moderate arthritic changes overhead joints Neuro: non-focal, well-oriented, positive affect Breasts: The right breast is status post remote biopsies for benign disease.  There are no palpable masses.  Both axillae are benign.   LAB RESULTS:  CMP     Component Value Date/Time   NA 142 07/19/2017 1510   K 4.1 07/19/2017 1510   CL 107 07/19/2017 1510   CO2 30 07/19/2017 1510   GLUCOSE  126 (H) 07/19/2017 1510   BUN 13 07/19/2017 1510   CREATININE 0.81 07/19/2017 1510   CALCIUM 9.8 07/19/2017 1510   PROT 6.4 (L) 07/19/2017 1510   ALBUMIN 4.2 07/19/2017 1510   AST 12 (L) 07/19/2017 1510   ALT 13 07/19/2017 1510   ALKPHOS 86 07/19/2017 1510   BILITOT 0.5 07/19/2017 1510   GFRNONAA >60 07/19/2017 1510   GFRAA >60 07/19/2017 1510    No results found for: TOTALPROTELP, ALBUMINELP, A1GS, A2GS, BETS, BETA2SER, GAMS, MSPIKE, SPEI  No results found for: KPAFRELGTCHN, LAMBDASER, KAPLAMBRATIO  Lab Results  Component Value Date   WBC 6.5 07/19/2017   NEUTROABS 2.3 07/19/2017   HGB 13.6 07/19/2017   HCT 41.6 07/19/2017   MCV 86.1 07/19/2017   PLT 87 (L) 07/19/2017    _0 @  No results found for: LABCA2  No components found for: BTYOMA004  No results for input(s): INR in the last 168 hours.  No results found for: LABCA2  No results found for: HTX774  No results found for: FSE395  No results found for: VUY233  No results found for: CA2729  No components found for: HGQUANT  No results found for: CEA1 / No results found for: CEA1   No results found for: AFPTUMOR  No results found for: CHROMOGRNA  No results found for: PSA1  Appointment on 07/19/2017  Component Date Value Ref Range Status  . WBC Count 07/19/2017 6.5  3.9 - 10.3 K/uL Final  . RBC 07/19/2017 4.83  3.70 - 5.45 MIL/uL Final  . Hemoglobin 07/19/2017 13.6  11.6 - 15.9 g/dL Final  . HCT 07/19/2017 41.6  34.8 - 46.6 % Final  . MCV 07/19/2017 86.1  79.5 - 101.0 fL Final  . MCH 07/19/2017 28.2  25.1 - 34.0 pg Final  . MCHC 07/19/2017 32.7  31.5 - 36.0 g/dL Final  . RDW 07/19/2017 14.8* 11.2 - 14.5 % Final  . Platelet Count 07/19/2017 87* 145 - 400 K/uL Final  . Neutrophils Relative % 07/19/2017 36  % Final  . Neutro Abs 07/19/2017 2.3  1.5 - 6.5 K/uL Final  . Lymphocytes Relative 07/19/2017 52  % Final  . Lymphs Abs 07/19/2017 3.4* 0.9 - 3.3 K/uL Final  . Monocytes Relative  07/19/2017 8  % Final  .  Monocytes Absolute 07/19/2017 0.5  0.1 - 0.9 K/uL Final  . Eosinophils Relative 07/19/2017 4  % Final  . Eosinophils Absolute 07/19/2017 0.3  0.0 - 0.5 K/uL Final  . Basophils Relative 07/19/2017 0  % Final  . Basophils Absolute 07/19/2017 0.0  0.0 - 0.1 K/uL Final   Performed at [facility], [place] [address], [place], [place] [address]  . Sodium 07/19/2017 142  135 - 145 mmol/L Final   Please note reference intervals were recently updated.  . Potassium 07/19/2017 4.1  3.5 - 5.1 mmol/L Final  . Chloride 07/19/2017 107  98 - 111 mmol/L Final  . CO2 07/19/2017 30  22 - 32 mmol/L Final  . Glucose, Bld 07/19/2017 126* 70 - 99 mg/dL Final  . BUN 07/19/2017 13  8 - 23 mg/dL Final   Please note change in reference range.  . Creatinine 07/19/2017 0.81  0.44 - 1.00 mg/dL Final  . Calcium 07/19/2017 9.8  8.9 - 10.3 mg/dL Final  . Total Protein 07/19/2017 6.4* 6.5 - 8.1 g/dL Final  . Albumin 07/19/2017 4.2  3.5 - 5.0 g/dL Final  . AST 07/19/2017 12* 15 - 41 U/L Final  . ALT 07/19/2017 13  0 - 44 U/L Final  . Alkaline Phosphatase 07/19/2017 86  38 - 126 U/L Final  . Total Bilirubin 07/19/2017 0.5  0.3 - 1.2 mg/dL Final  . GFR, Est Non Af Am 07/19/2017 >60  >60 mL/min Final  . GFR, Est AFR Am 07/19/2017 >60  >60 mL/min Final   Comment: (NOTE) The eGFR has been calculated using the CKD EPI equation. This calculation has not been validated in all clinical situations. eGFR's persistently <60 mL/min signify possible Chronic Kidney Disease.   Georgiann Hahn gap 07/19/2017 5  5 - 15 Final   Performed at [facility], [place] [address] [place], [name] [address]  . LDH 07/19/2017 259* 98 - 192 U/L Final   Performed at [facility], [place] [address], [place], [place] [address]  . Smear Review 07/19/2017 SMEAR STAINED AND AVAILABLE FOR REVIEW   Final   Performed at [facility], [address] [place], [place] [address]  . Uric Acid, Serum 07/19/2017 5.8  2.5 - 7.1 mg/dL Final   Performed at [facility], [place] [address] [place], [place] [address]    (this displays the last labs from the last 3 days)  No results found for: TOTALPROTELP, ALBUMINELP, A1GS, A2GS, BETS, BETA2SER, GAMS, MSPIKE, SPEI (this displays SPEP labs)  No results found for: KPAFRELGTCHN, LAMBDASER, KAPLAMBRATIO (kappa/lambda light chains)  No results found for: HGBA, HGBA2QUANT, HGBFQUANT, HGBSQUAN (Hemoglobinopathy evaluation)   Lab Results  Component Value Date   LDH 259 (H) 07/19/2017    No results found for: IRON, TIBC, IRONPCTSAT (Iron and TIBC)  No results found for: FERRITIN  Urinalysis    Component Value Date/Time   COLORURINE YELLOW 05/13/2009 [place] 05/13/2009 0821   LABSPEC 1.009 05/13/2009 0821   PHURINE 7.0 05/13/2009 [place] 05/13/2009 0821   HGBUR NEGATIVE 05/13/2009 [place] 05/13/2009 0821   KETONESUR NEGATIVE 05/13/2009 0821   PROTEINUR NEGATIVE 05/13/2009 0821   UROBILINOGEN 0.2 05/13/2009 0821   NITRITE NEGATIVE 05/13/2009 0821   LEUKOCYTESUR TRACE (A) 05/13/2009 0821     STUDIES: Outside records reviewed with the patient  ELIGIBLE FOR AVAILABLE RESEARCH PROTOCOL: no  ASSESSMENT: 77  y.o. [place], [place] woman with chronic lymphoid leukemia initially diagnosed 2001, Rai stage 0; CD 20 and CD5 positive, CD23 and CD10 negative, lambda restricted; FISH studies February 2015 showing trisomy 12 and deletion 100 (heterozygous), but no t(11,14) and unremarkable TP53 and ATM;  not requiring treatment to date  (1) total IgG less than 450 as of March 2019  (2) moderate thrombocytopenia, stable  PLAN: We spent the better part of today's hour-long appointment discussing the biology of her diagnosis and the specifics of her situation.  We discussed the fact that red cells are made in the  marrow, the 3 types of red cells, and the functions of white cells, red cells, and platelets.  We discussed the different types of white cells and the fact that cancer of white cells can be called leukemia or lymphoma.  We discussed the various types of leukemias including chronic versus acute on lymphoid versus myeloid and we discussed the fact that there are several types of chronic lymphoid leukemia's, she having a B-cell subtype.  We then reviewed the possible complications of chronic lymphoid leukemia including "B" symptoms, immunosuppression, anemia, and thrombocytopenia.  We discussed the rare cases of transformation to an acute leukemia.  We finally discussed indications for treatment which do include thrombocytopenia except that her moderately low platelet count has been present for over 10 years without any trend and therefore it is likely not related.  The one concern I have is that her IgG level has been dropping.  This means it may be difficult for her to overcome certain infections.  I have alerted her to the fact that if she has a temperature, gets antibiotics, improves, and then worsens again she may need an IVIG infusion and she should contact us under those circumstances.  Otherwise she generally sees Dr. Laurann [place] in October.  Accordingly she will see me again in April.  Since she does not receive mammography we will do yearly breast exam during that visit as well as routine labs  She knows to call for any other issues that may develop before the next visit.    Montia has a good understanding of the overall plan. She agrees with it. She knows the goal of treatment in her case is cure. She will call with any problems that may develop before her next visit here.   Naziah Weckerly, Virgie Dad, MD  07/19/17 4:39 PM Medical Oncology and Hematology [facility] [address] [place], [place] [address] Tel. [phone]    Fax. [phone]  Alice Rieger, am acting as  scribe for Chauncey Cruel MD.  I, Lurline Del MD, have reviewed the above documentation for accuracy and completeness, and I agree with the above.

## 2017-07-19 ENCOUNTER — Inpatient Hospital Stay: Payer: Medicare Other | Attending: Oncology | Admitting: Oncology

## 2017-07-19 ENCOUNTER — Inpatient Hospital Stay: Payer: Medicare Other

## 2017-07-19 VITALS — BP 129/61 | HR 79 | Temp 98.8°F | Resp 18 | Ht 67.0 in | Wt 194.3 lb

## 2017-07-19 DIAGNOSIS — D803 Selective deficiency of immunoglobulin G [IgG] subclasses: Secondary | ICD-10-CM

## 2017-07-19 DIAGNOSIS — G600 Hereditary motor and sensory neuropathy: Secondary | ICD-10-CM | POA: Insufficient documentation

## 2017-07-19 DIAGNOSIS — C9110 Chronic lymphocytic leukemia of B-cell type not having achieved remission: Secondary | ICD-10-CM

## 2017-07-19 DIAGNOSIS — D696 Thrombocytopenia, unspecified: Secondary | ICD-10-CM | POA: Diagnosis not present

## 2017-07-19 DIAGNOSIS — G609 Hereditary and idiopathic neuropathy, unspecified: Secondary | ICD-10-CM

## 2017-07-19 DIAGNOSIS — I471 Supraventricular tachycardia: Secondary | ICD-10-CM

## 2017-07-19 LAB — CBC WITH DIFFERENTIAL (CANCER CENTER ONLY)
BASOS PCT: 0 %
Basophils Absolute: 0 10*3/uL (ref 0.0–0.1)
EOS ABS: 0.3 10*3/uL (ref 0.0–0.5)
Eosinophils Relative: 4 %
HCT: 41.6 % (ref 34.8–46.6)
HEMOGLOBIN: 13.6 g/dL (ref 11.6–15.9)
LYMPHS ABS: 3.4 10*3/uL — AB (ref 0.9–3.3)
Lymphocytes Relative: 52 %
MCH: 28.2 pg (ref 25.1–34.0)
MCHC: 32.7 g/dL (ref 31.5–36.0)
MCV: 86.1 fL (ref 79.5–101.0)
Monocytes Absolute: 0.5 10*3/uL (ref 0.1–0.9)
Monocytes Relative: 8 %
NEUTROS PCT: 36 %
Neutro Abs: 2.3 10*3/uL (ref 1.5–6.5)
PLATELETS: 87 10*3/uL — AB (ref 145–400)
RBC: 4.83 MIL/uL (ref 3.70–5.45)
RDW: 14.8 % — ABNORMAL HIGH (ref 11.2–14.5)
WBC: 6.5 10*3/uL (ref 3.9–10.3)

## 2017-07-19 LAB — CMP (CANCER CENTER ONLY)
ALT: 13 U/L (ref 0–44)
ANION GAP: 5 (ref 5–15)
AST: 12 U/L — ABNORMAL LOW (ref 15–41)
Albumin: 4.2 g/dL (ref 3.5–5.0)
Alkaline Phosphatase: 86 U/L (ref 38–126)
BUN: 13 mg/dL (ref 8–23)
CALCIUM: 9.8 mg/dL (ref 8.9–10.3)
CHLORIDE: 107 mmol/L (ref 98–111)
CO2: 30 mmol/L (ref 22–32)
CREATININE: 0.81 mg/dL (ref 0.44–1.00)
Glucose, Bld: 126 mg/dL — ABNORMAL HIGH (ref 70–99)
Potassium: 4.1 mmol/L (ref 3.5–5.1)
SODIUM: 142 mmol/L (ref 135–145)
Total Bilirubin: 0.5 mg/dL (ref 0.3–1.2)
Total Protein: 6.4 g/dL — ABNORMAL LOW (ref 6.5–8.1)

## 2017-07-19 LAB — URIC ACID: URIC ACID, SERUM: 5.8 mg/dL (ref 2.5–7.1)

## 2017-07-19 LAB — SAVE SMEAR

## 2017-07-19 LAB — LACTATE DEHYDROGENASE: LDH: 259 U/L — AB (ref 98–192)

## 2017-07-20 ENCOUNTER — Telehealth: Payer: Self-pay | Admitting: Oncology

## 2017-07-20 LAB — IGG, IGA, IGM
IgA: 52 mg/dL — ABNORMAL LOW (ref 64–422)
IgG (Immunoglobin G), Serum: 418 mg/dL — ABNORMAL LOW (ref 700–1600)
IgM (Immunoglobulin M), Srm: 12 mg/dL — ABNORMAL LOW (ref 26–217)

## 2017-07-20 LAB — BETA 2 MICROGLOBULIN, SERUM: Beta-2 Microglobulin: 2.6 mg/L — ABNORMAL HIGH (ref 0.6–2.4)

## 2017-07-20 NOTE — Telephone Encounter (Signed)
Per 7/1 no los 

## 2017-07-21 NOTE — Progress Notes (Signed)
Thank you, Gus MCr

## 2017-09-06 DIAGNOSIS — I4891 Unspecified atrial fibrillation: Secondary | ICD-10-CM | POA: Diagnosis not present

## 2017-09-07 ENCOUNTER — Telehealth: Payer: Self-pay | Admitting: Cardiovascular Disease

## 2017-09-07 NOTE — Telephone Encounter (Signed)
Spoke with pt. Pt sts that on Sunday @ 3am she was awaken by her heart racing. She woke her husband up who is an EMT to check her BP and HR. Pt reports that her BP was 100/58, and her her rate was too fast for her husband to check manually.  Pt sts that they called the local fired dept who came out and connected her to a monitor. Her HR was above 150pm. She did not go to the ED because after a while things started to calm down. Her heartrate went down to 120bpm and her BP went up to 120/86.  Pt sts that she feels fine now, and prior to this episode she has been doing well.  Pt sts that she was taking Metoprolol 12.5mg  bid prn for palpitations/tach. It was d/c on 06/19/17 by Dr.C due to hypotension.   Pt would like to know if she should resume Metoprolol prn or if Dr.C has any other recommendations for rate control/palp. Pt is scheduled to see Dr.C in Oct 2019  Adv pt that I will fwd a message to Dr.C for his recommendation and we will give her a f/u call. Pt agreeable with plan and verbalized understanding.

## 2017-09-07 NOTE — Telephone Encounter (Signed)
New Message    Pt c/o medication issue:  1. Name of Medication: metoprolol tartrate (LOPRESSOR) 25 MG tablet    2. How are you currently taking this medication (dosage and times per day)?  3. Are you having a reaction (difficulty breathing--STAT)?   4. What is your medication issue? Patient is calling because she was told to stop taking the metoprolol. But one morning she had to call the ambulance due to rapid heartbeats so she wants to know should she resume. Please call.

## 2017-09-07 NOTE — Telephone Encounter (Signed)
Left a detailed message on the pt phone as requested with Dr.C's recommendation  Croitoru, Mihai, MD  You 25 minutes ago (10:59 AM)      Yes, it is okay to restart the metoprolol 12.5 mg twice daily.  We had stopped it temporarily when she developed hypotension during her respiratory infection in June.  Unfortunately, like most medications we have available it will inevitably reduce her blood pressure even as it is helping with the tachycardia.      Documentation    Pt is to call the office if any questions or any further assistance is needed.

## 2017-09-07 NOTE — Telephone Encounter (Signed)
Yes, it is okay to restart the metoprolol 12.5 mg twice daily.  We had stopped it temporarily when she developed hypotension during her respiratory infection in June.  Unfortunately, like most medications we have available it will inevitably reduce her blood pressure even as it is helping with the tachycardia.

## 2017-10-22 ENCOUNTER — Encounter: Payer: Self-pay | Admitting: Cardiovascular Disease

## 2017-10-22 ENCOUNTER — Ambulatory Visit (INDEPENDENT_AMBULATORY_CARE_PROVIDER_SITE_OTHER): Payer: Medicare Other | Admitting: Cardiovascular Disease

## 2017-10-22 VITALS — BP 112/60 | HR 66 | Ht 67.0 in | Wt 193.8 lb

## 2017-10-22 DIAGNOSIS — I480 Paroxysmal atrial fibrillation: Secondary | ICD-10-CM | POA: Diagnosis not present

## 2017-10-22 MED ORDER — APIXABAN 5 MG PO TABS: 5.0000 mg | ORAL_TABLET | Freq: Two times a day (BID) | ORAL | 5 refills | Status: DC

## 2017-10-22 NOTE — Progress Notes (Signed)
Cardiology Office Note:    Date:  10/22/2017   ID:  Tasha [name], DOB 11-22-1940, MRN [id number]  PCP:  Lavone Orn, MD  Cardiologist:  Sanda Klein, MD    Referring MD: Lavone Orn, MD   Chief complaint: atrial fibrillation   History of Present Illness:    Tasha [name] is a 78 y.o. female with a hx of paroxysmal atrial tachycardia, and recently discovered to also have atrial fibrillation.  Additional medical problems include CLL with mild thrombocytopenia, multiple sclerosis with very infrequent problems, ocular migraines and idiopathic peripheral neuropathy.   She felt unwell a few months ago and EMS performed an ECG that shows clear evidence of atrial fibrillation with rapid ventricular response in the 120s.  The symptoms lasted for about 2 or 3 hours and resolved spontaneously.  She did not have any chest pain but felt mildly short of breath and dizzy.  She does not have known structural heart disease. She had a normal echo in 2013 at [facility] (normal LV systolic function, mild LVH with septal wall thickness 1.2 cm, borderline left atrial dilation at 3.9 cm, 25 cm). She does not have known coronary artery problems.  The patient specifically denies any chest pain with exertion, dyspnea with exertion, orthopnea, paroxysmal nocturnal dyspnea, syncope, palpitations, focal neurological deficits, intermittent claudication, lower extremity edema, unexplained weight gain, cough, hemoptysis or wheezing.  Despite having mild thrombocytopenia, she has never had bleeding problems.  She denies a history of stroke, TIA or other embolic events.   Past Medical History:  Diagnosis Date  . Atrial fibrillation ([place])    questionable, echo 03/23/11 - EF >55%, on warfarin  . Bell's palsy   . 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 Gait disturbance   . Gallstones   . 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 IFG (impaired fasting  glucose)    e  . MS (multiple sclerosis) ([place])   . Personal history of other diseases of circulatory system   . Tick bite   . Unspecified hereditary and idiopathic peripheral neuropathy   . Vitamin B12 deficiency   . Vitamin D deficiency     Past Surgical History:  Procedure Laterality Date  . ABDOMINAL HYSTERECTOMY  1984   fibroids  . BREAST SURGERY  1960s   benign breast tumors  . CATARACT EXTRACTION W/ INTRAOCULAR LENS  IMPLANT, BILATERAL    . LAPAROSCOPIC BILATERAL SALPINGO OOPHERECTOMY  2003   ovarian cyst    Current Medications: Current Meds  Medication Sig  . cholecalciferol (VITAMIN D) 1000 units tablet Take 1,000 Units by mouth daily.  . Cyanocobalamin (VITAMIN B 12 PO) Take 100 mg by mouth daily.   . metoprolol tartrate (LOPRESSOR) 25 MG tablet TAKE 1/2 TABLET BY MOUTH TWICE DAILY, MAY TAKE AN ADDITIONAL 1/2 TABLET EVERY DAY AS NEEDED FOR PALPITATIONS  . [DISCONTINUED] aspirin 81 MG tablet Take 162 mg by mouth at bedtime.      Allergies:   Clindamycin; Celebrex [celecoxib]; Statins; Tape; Tricor [fenofibrate]; Amoxicillin; Clarithromycin; and Latex   Social History   Socioeconomic History  . Marital status: Married    Spouse name: Colen  . Number of children: 3  . Years of education: college  . Highest education level: Not on file  Occupational History    Comment: Retired  Scientific laboratory technician  . Financial resource strain: Not on file  . Food insecurity:    Worry: Not on file  Inability: Not on file  . Transportation needs:    Medical: Not on file    Non-medical: Not on file  Tobacco Use  . Smoking status: Never Smoker  . Smokeless tobacco: Never Used  Substance and Sexual Activity  . Alcohol use: No    Alcohol/week: 0.0 standard drinks  . Drug use: No  . Sexual activity: Not on file  Lifestyle  . Physical activity:    Days per week: Not on file    Minutes per session: Not on file  . Stress: Not on file  Relationships  . Social connections:     Talks on phone: Not on file    Gets together: Not on file    Attends religious service: Not on file    Active member of club or organization: Not on file    Attends meetings of clubs or organizations: Not on file    Relationship status: Not on file  Other Topics Concern  . 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 Patient has college education.    Right handed.   Caffeine- not every day . Soda or tea when she is out for dinner.     Family History: The patient's family history includes Cancer in her daughter; Coronary artery disease in her brother and father; Diabetes in her brother and father; Hypertension in her brother, father, and mother. ROS:   Please see the history of present illness.     All other systems reviewed and are negative.  EKGs/Labs/Other Studies Reviewed:    The following studies were reviewed today: ECG from EMS EKG:  EKG is ordered today.  It shows normal sinus rhythm, poor R wave progression but otherwise normal tracing, QTC 425 ms  Recent Labs: 07/19/2017: ALT 13; BUN 13; Creatinine 0.81; Hemoglobin 13.6; Platelet Count 87; Potassium 4.1; Sodium 142  Recent Lipid Panel No results found for: CHOL, TRIG, HDL, CHOLHDL, VLDL, LDLCALC, LDLDIRECT  Physical Exam:    VS:  BP 112/60   Pulse 66   Ht 5\' 7"  (1.702 m)   Wt 193 lb 12.8 oz (87.9 kg)   BMI 30.35 kg/m     Wt Readings from Last 3 Encounters:  10/22/17 193 lb 12.8 oz (87.9 kg)  07/19/17 194 lb 4.8 oz (88.1 kg)  07/30/16 196 lb (88.9 kg)     GEN:  Well nourished, well developed in no acute distress HEENT: Normal NECK: No JVD; No carotid bruits LYMPHATICS: No lymphadenopathy CARDIAC: RRR, no murmurs, rubs, gallops RESPIRATORY:  Clear to auscultation without rales, wheezing or rhonchi  ABDOMEN: Soft, non-tender, non-distended MUSCULOSKELETAL:  No edema; No deformity  SKIN: Warm and dry NEUROLOGIC:  Alert and oriented x 3 PSYCHIATRIC:  Normal affect     ASSESSMENT:    1. Paroxysmal atrial fibrillation (HCC)    PLAN:    In order of problems listed above:  1. AFib: It is hard to say how often this is happening, but recently she has been having palpitations as much as weekly.  However there are not very symptomatic.  Attempts to give her higher doses of beta-blocker were associated with symptomatic hypotension.  We will keep on same beta-blocker dose, with the option to take additional dose of the metoprolol as needed.  We discussed the option for oral antiarrhythmic therapy or referral for radiofrequency ablation, but at this point she does not believe that the potential side effects of these medicines or procedures are justified, since  the symptoms are mild.  If she has become more symptomatic, she might be a very good candidate for radiofrequency ablation (no structural heart disease, small left atrium when last assessed by echo, paroxysmal arrhythmia).  She may also do well with the Multaq or flecainide (we would have to perform a formal evaluation for CAD before starting the latter).  We discussed at length the risk of stroke associated with atrial fibrillation.  She understands the need to switch from aspirin to a more potent oral anticoagulant such as Eliquis. CHADSVasc 3 (age 52, gender).  We reviewed the potential bleeding complications, but I think these are small compared with the risk of embolic events.  I do not think her mild thrombocytopenia is a concern at this point.  We will also repeat an echocardiogram to make sure that nothing has changed since the study performed at [facility] over 6 years ago. .   Medication Adjustments/Labs and Tests Ordered: Current medicines are reviewed at length with the patient today.  Concerns regarding medicines are outlined above.  Orders Placed This Encounter  Procedures  . EKG 12-Lead  . ECHOCARDIOGRAM COMPLETE   Meds ordered this encounter  Medications  . apixaban (ELIQUIS) 5 MG TABS tablet     Sig: Take 1 tablet (5 mg total) by mouth 2 (two) times daily.    Dispense:  60 tablet    Refill:  5   STOP ASPIRIN   Signed, Sanda Klein, MD  10/22/2017 12:43 PM    [place]

## 2017-10-22 NOTE — Patient Instructions (Addendum)
Medication Instructions:  Dr Sallyanne Kuster has recommended making the following medication changes: 1. START Eliquis 5 - take 1 tablet by mouth twice daily  If you need a refill on your cardiac medications before your next appointment, please call your pharmacy.   Lab work: NONE ORDERED  Testing/Procedures: ECHOCARDIOGRAM - Your physician has requested that you have an echocardiogram. Echocardiography is a painless test that uses sound waves to create images of your heart. It provides your doctor with information about the size and shape of your heart and how well your heart's chambers and valves are working. This procedure takes approximately one hour. There are no restrictions for this procedure.  >> This has been ordered to be completed at our [facility] location [address] [place] [address] [phone]  Follow-Up: At [facility], you and your health needs are our priority.  As part of our continuing mission to provide you with exceptional heart care, we have created designated Provider Care Teams.  These Care Teams include your primary Cardiologist (physician) and Advanced Practice Providers (APPs -  Physician Assistants and Nurse Practitioners) who all work together to provide you with the care you need, when you need it. You will need a follow up appointment in 3 months.  You may see one of the following Advanced Practice Providers on your designated Care Team: Almyra Deforest, [place] . Fabian Sharp, PA-C  Dr Croitoru recommends that you schedule a follow-up appointment in 6 months.

## 2017-11-01 ENCOUNTER — Ambulatory Visit (HOSPITAL_COMMUNITY): Payer: Medicare Other | Attending: Cardiology

## 2017-11-01 ENCOUNTER — Other Ambulatory Visit: Payer: Self-pay

## 2017-11-01 DIAGNOSIS — I480 Paroxysmal atrial fibrillation: Secondary | ICD-10-CM | POA: Diagnosis not present

## 2017-11-08 DIAGNOSIS — E782 Mixed hyperlipidemia: Secondary | ICD-10-CM | POA: Diagnosis not present

## 2017-11-08 DIAGNOSIS — R7301 Impaired fasting glucose: Secondary | ICD-10-CM | POA: Diagnosis not present

## 2017-11-08 DIAGNOSIS — I480 Paroxysmal atrial fibrillation: Secondary | ICD-10-CM | POA: Diagnosis not present

## 2017-11-08 DIAGNOSIS — G609 Hereditary and idiopathic neuropathy, unspecified: Secondary | ICD-10-CM | POA: Diagnosis not present

## 2017-11-08 DIAGNOSIS — Z23 Encounter for immunization: Secondary | ICD-10-CM | POA: Diagnosis not present

## 2017-11-08 DIAGNOSIS — Z1389 Encounter for screening for other disorder: Secondary | ICD-10-CM | POA: Diagnosis not present

## 2017-11-08 DIAGNOSIS — Z0000 Encounter for general adult medical examination without abnormal findings: Secondary | ICD-10-CM | POA: Diagnosis not present

## 2017-11-30 DIAGNOSIS — H524 Presbyopia: Secondary | ICD-10-CM | POA: Diagnosis not present

## 2017-11-30 DIAGNOSIS — Z961 Presence of intraocular lens: Secondary | ICD-10-CM | POA: Diagnosis not present

## 2017-12-10 ENCOUNTER — Telehealth: Payer: Self-pay | Admitting: Cardiovascular Disease

## 2017-12-10 NOTE — Telephone Encounter (Signed)
  Pt c/o medication issue:  1. Name of Medication: Eliquis  2. How are you currently taking this medication (dosage and times per day)? Take 1 tablet (5 mg total) by mouth 2 (two) times daily  3. Are you having a reaction (difficulty breathing--STAT)? No  4. What is your medication issue? Patient is not sure if she took her pill this morning or not. She wants to know if she will be okay if she doesn't take it until her nighttime dose.

## 2017-12-10 NOTE — Telephone Encounter (Signed)
Returned call to patient she stated she cannot remember if she took Eliquis this morning.Advised not to take now, just take pm dose.

## 2018-01-25 ENCOUNTER — Encounter: Payer: Self-pay | Admitting: Physician Assistant

## 2018-01-25 ENCOUNTER — Ambulatory Visit (INDEPENDENT_AMBULATORY_CARE_PROVIDER_SITE_OTHER): Payer: Medicare Other | Admitting: Medical

## 2018-01-25 VITALS — BP 110/56 | HR 76 | Ht 67.0 in | Wt 197.0 lb

## 2018-01-25 DIAGNOSIS — M79604 Pain in right leg: Secondary | ICD-10-CM

## 2018-01-25 DIAGNOSIS — I959 Hypotension, unspecified: Secondary | ICD-10-CM

## 2018-01-25 DIAGNOSIS — M79605 Pain in left leg: Secondary | ICD-10-CM

## 2018-01-25 DIAGNOSIS — I480 Paroxysmal atrial fibrillation: Secondary | ICD-10-CM | POA: Diagnosis not present

## 2018-01-25 NOTE — Patient Instructions (Signed)
Medication Instructions:  The current medical regimen is effective;  continue present plan and medications.  If you need a refill on your cardiac medications before your next appointment, please call your pharmacy.    Follow-Up: At [facility], you and your health needs are our priority.  As part of our continuing mission to provide you with exceptional heart care, we have created designated Provider Care Teams.  These Care Teams include your primary Cardiologist (physician) and Advanced Practice Providers (APPs -  Physician Assistants and Nurse Practitioners) who all work together to provide you with the care you need, when you need it. You will need a follow up appointment in 6 months.  Please call our office 2 months in advance to schedule this appointment.  You may see Mihai Croitoru, MD or one of the following Advanced Practice Providers on your designated Care Team: Hao Meng, PA-C . Angela Duke, PA-C     

## 2018-01-25 NOTE — Progress Notes (Signed)
.     Cardiology Office Note   Date:  01/25/2018   ID:  Tasha [name], DOB 17-Aug-1940, MRN [id number]  PCP:  Lavone Orn, MD  Cardiologist:  Sanda Klein, MD EP: None  Chief Complaint  Patient presents with  . Follow-up    eliquis follow up; doing good no more swelling in her legs         History of Present Illness: Tasha [name] is a 78 y.o. female with a PMH of paroxysmal atrial fibrillation on Eliquis, CLL with mild thrombocytopenia, MS, and migraines, who presents for follow-up of her atrial fibrillation.   She was last seen outpatient by cardiology, Dr. Sallyanne Kuster 10/2017 at which time she reported weekly episodes of palpitations without significant associated symptoms. She had trialed higher doses of metoprolol in the past which resulted in symptomatic hypotension. They discussed antiarrhythmic medications and possible ablation at that visit but favored prn metoprolol for the time being. Echo repeated 10/2017 with EF 65-70%, G1DD, normal wall motion, and normal LA size.   She presents alone today for follow-up. Since her last visit she reports no complaints of palpitations. She reports occasional episodes of hypotension with SBP in the 90s which she states occurred on days when she overexerted herself (Thanksgiving, Christmas, and New Years). She states when hypotension occurs she sits down to rest for a few minutes and her lightheadedness resolves. No complaints of hematuria, hematochezia, or melena. No complaints of chest pain, SOB, or LE edema. She has chronic LE nerve pain and questions whether she has peripheral artery disease after reading the informational sign in the exam room. No complaints of claudications or skin discolorations with position changes.   Past Medical History:  Diagnosis Date  . Atrial fibrillation ([place])    questionable, echo 03/23/11 - EF >55%, on warfarin  . Bell's palsy   . 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 Chronic lymphoid leukemia, without mention of  having achieved remission(204.10)   . Gait disturbance   . Gallstones   . 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 IFG (impaired fasting glucose)    e  . MS (multiple sclerosis) ([place])   . Personal history of other diseases of circulatory system   . Tick bite   . Unspecified hereditary and idiopathic peripheral neuropathy   . Vitamin B12 deficiency   . Vitamin D deficiency     Past Surgical History:  Procedure Laterality Date  . ABDOMINAL HYSTERECTOMY  1984   fibroids  . BREAST SURGERY  1960s   benign breast tumors  . CATARACT EXTRACTION W/ INTRAOCULAR LENS  IMPLANT, BILATERAL    . LAPAROSCOPIC BILATERAL SALPINGO OOPHERECTOMY  2003   ovarian cyst     Current Outpatient Medications  Medication Sig Dispense Refill  . apixaban (ELIQUIS) 5 MG TABS tablet Take 1 tablet (5 mg total) by mouth 2 (two) times daily. 60 tablet 5  . cholecalciferol (VITAMIN D) 1000 units tablet Take 1,000 Units by mouth daily.    . Cyanocobalamin (VITAMIN B 12 PO) Take 100 mg by mouth daily.     . metoprolol tartrate (LOPRESSOR) 25 MG tablet TAKE 1/2 TABLET BY MOUTH TWICE DAILY, MAY TAKE AN ADDITIONAL 1/2 TABLET EVERY DAY AS NEEDED FOR PALPITATIONS 45 tablet 0  . montelukast (SINGULAIR) 10 MG tablet Take 10 mg by mouth daily as needed (AS DIRECTED BY PHYSICIAN).      No current facility-administered medications for this visit.     Allergies:   Clindamycin; Celebrex [celecoxib]; Statins; Tape;  Tricor [fenofibrate]; Amoxicillin; Clarithromycin; and Latex    Social History:  The patient  reports that she has never smoked. She has never used smokeless tobacco. She reports that she does not drink alcohol or use drugs.   Family History:  The patient's family history includes Cancer in her daughter; Coronary artery disease in her brother and father; Diabetes in her brother and father; Hypertension in her brother, father, and mother.    ROS:  Please see the history of present illness.   Otherwise, review of  systems are positive for none.   All other systems are reviewed and negative.    PHYSICAL EXAM: VS:  BP (!) 110/56   Pulse 76   Ht 5\' 7"  (1.702 m)   Wt 197 lb (89.4 kg)   BMI 30.85 kg/m  , BMI Body mass index is 30.85 kg/m. GEN: Well nourished, well developed, in no acute distress HEENT: sclera anicteric  Neck: no JVD, carotid bruits, or masses : Cardiac: RRR; no murmurs, rubs, or gallops,no edema  Respiratory:  clear to auscultation bilaterally, normal work of breathing GI: soft, nontender, nondistended, + BS MS: no deformity or atrophy Skin: warm and dry, no rash Neuro:  Strength and sensation are intact Psych: euthymic mood, full affect   EKG:  EKG is not ordered today.   Recent Labs: 07/19/2017: ALT 13; BUN 13; Creatinine 0.81; Hemoglobin 13.6; Platelet Count 87; Potassium 4.1; Sodium 142    Lipid Panel No results found for: CHOL, TRIG, HDL, CHOLHDL, VLDL, LDLCALC, LDLDIRECT    Wt Readings from Last 3 Encounters:  01/25/18 197 lb (89.4 kg)  10/22/17 193 lb 12.8 oz (87.9 kg)  07/19/17 194 lb 4.8 oz (88.1 kg)      Other studies Reviewed: Additional studies/ records that were reviewed today include:   Echocardiogram 10/2017: Study Conclusions  - Left ventricle: The cavity size was normal. There was severe   concentric hypertrophy. Systolic function was vigorous. The   estimated ejection fraction was in the range of 65% to 70%. Wall   motion was normal; there were no regional wall motion   abnormalities. Doppler parameters are consistent with abnormal   left ventricular relaxation (grade 1 diastolic dysfunction). - Aortic valve: Trileaflet; mildly thickened leaflets. - Mitral valve: Moderately thickened, mildly calcified leaflets . - Right ventricle: Systolic function was normal. - Right atrium: The atrium was normal in size. - Tricuspid valve: There was mild regurgitation. - Pulmonary arteries: Systolic pressure was within the normal   range. -  Pericardium, extracardiac: There was no pericardial effusion.    ASSESSMENT AND PLAN:  1. Paroxysmal atrial fibrillation: no complaints of palpitations since last visit with Dr. Loletha Grayer 10/2017. She has not needed to take prn metoprolol. She is doing well with eliquis and denies issues with bleeding.  - Continue metoprolol 12.5mg  BID with prn 12.5mg  dosing for persistent palpitations - Continue eliquis 5mg  BID for CHA2DS2-VASc Score of 3 (Female and age >59)  2. Hypotension: occurs sparingly on days when she overexerts herself. Lightheadedness improves with rest.  - Instructed on precautions to avoid falls associated with hypotension - Recommended increasing salt intake if BP persistently low - eat a bag of potato chips if experiencing lightheadedness.  - Continue low dose metoprolol at this time  3. LE pain: patient describes nerve pain in her feet/lower legs which has been a longstanding issue. No complaints of claudications or skin discolorations with position changes. Suspect this is 2/2 MS rather than PAD  - Continue to  monitor - no further work-up at this time.    Current medicines are reviewed at length with the patient today.  The patient does not have concerns regarding medicines.  The following changes have been made:  no change  Labs/ tests ordered today include: None No orders of the defined types were placed in this encounter.    Disposition:   FU with Dr. Sallyanne Kuster in 6 months  Signed, Abigail Butts, PA-C  01/25/2018 9:27 AM

## 2018-01-25 NOTE — Progress Notes (Signed)
Thank you MCr 

## 2018-01-28 ENCOUNTER — Telehealth: Payer: Self-pay | Admitting: *Deleted

## 2018-01-28 ENCOUNTER — Other Ambulatory Visit: Payer: Self-pay | Admitting: *Deleted

## 2018-01-28 ENCOUNTER — Telehealth: Payer: Self-pay | Admitting: Cardiovascular Disease

## 2018-01-28 DIAGNOSIS — C9110 Chronic lymphocytic leukemia of B-cell type not having achieved remission: Secondary | ICD-10-CM

## 2018-01-28 NOTE — Telephone Encounter (Signed)
This has happened to Tasha [name] before. Please ask her to drink lots of fluids and have some salty snacks, Gatorade, V8 or other salt load. Stay off metoprolol until dizziness resolves. MCr

## 2018-01-28 NOTE — Telephone Encounter (Addendum)
"  Tasha [name] [phone]).  I seem to be having symptoms not related to the heart;  had that visit Tuesday and the PA said everything is going well.  Are these symptoms related to CLL or medications interring with or affecting the CLL leukemia/?  Experiencing sweating, very tired, B/P lower and continuing to drop, feel like I could faint.  Yesterday I did nothing except cook dinner but had to sit down, prop my feet up.  Not taking the metoprolol for the atrial fibrillation.  It will drop my blood pressure more.  My normal B/P is 120/60 to 64.  Tuesday B/P = 110/56 P = 76.  This morning at 3:00 am B/P = 100/60.   Husband is an EMT checks B/Ps.  Chest tightness, not bad shortness of breath.  No palpitations.  I know and can tell when I'm in atrial fib and have not.  Started Eliquis Oct 2019.  I'm going to try yo find the information about side effects."  Denies symptoms with this call.  Instructed to call cardiology only asked to "speak with Dr. Jana Hakim or his nurse.  Call my mobile number, [phone].   I'm taking someone to a doctor's appointment."

## 2018-01-28 NOTE — Telephone Encounter (Signed)
This RN received VM from pt as well as a VM from our Triage Nurse relating to increase in symptoms.  Per this RN's discussion with pt - she has contacted her cardiologist and is waiting on a return call " because may be it is the Eliquis causing side effects " Note pt was seen just 01/25/2018 at Cardiology office.  Per this RN's phone discussion- plan is to come in Monday for lab and evaluation for her concern regarding her history of leukemia.  Symptoms discussed including that if the patient feels they are worsening she should proceed to the ER.  Tasha [name] verbalized understanding.  Appointment made by this RN.  This note will be forwarded to MD and NP/ see prior note entered by Triage Nurse.

## 2018-01-28 NOTE — Telephone Encounter (Signed)
Returned call to patient.Dr.Croitoru's advice given.Patient will call back to cancel appointment 1/14 if she feels better.

## 2018-01-28 NOTE — Telephone Encounter (Signed)
New Message:   Patient says she thinks she has a reaction to medications and patient states she is feeling dizziness. Patient states that her BP was low this morning. Patient states she is having some tightness in her chest. Please call patient.

## 2018-01-28 NOTE — Telephone Encounter (Signed)
Follow up:    Patient returning call back. Please call patient back.

## 2018-01-28 NOTE — Telephone Encounter (Signed)
Returned call to patient she stated she has been having dizziness,feels like she is going to pass out.Stated she has not passed out.She sits down and feeling goes away.She saw our PA this past Tuesday.Stated on Wed 1/8 she started having chest tightness off and on.No chest tightness at present.Stated she held her Metoprolol today B/P 100/60.She has appointment with cancer Dr.on Mon 1/13. Appointment scheduled with Jory Sims DNP 02/01/18 at 11:30 am.Advised to go to [facility] ED if needed.

## 2018-01-28 NOTE — Telephone Encounter (Signed)
Attempted to return call to patient. Phone did not ring, went straight to VM. LMTCB  Of note, she saw Teodoro Kil, PA 1/7 and no med changes were made

## 2018-01-31 ENCOUNTER — Inpatient Hospital Stay: Payer: Medicare Other | Attending: Adult Health | Admitting: Adult Health

## 2018-01-31 ENCOUNTER — Encounter: Payer: Self-pay | Admitting: Adult Health

## 2018-01-31 ENCOUNTER — Inpatient Hospital Stay: Payer: Medicare Other

## 2018-01-31 VITALS — BP 136/70 | HR 88 | Temp 98.4°F | Resp 18 | Ht 67.0 in | Wt 198.8 lb

## 2018-01-31 DIAGNOSIS — Z79899 Other long term (current) drug therapy: Secondary | ICD-10-CM | POA: Diagnosis not present

## 2018-01-31 DIAGNOSIS — Z7901 Long term (current) use of anticoagulants: Secondary | ICD-10-CM | POA: Insufficient documentation

## 2018-01-31 DIAGNOSIS — I4891 Unspecified atrial fibrillation: Secondary | ICD-10-CM | POA: Diagnosis not present

## 2018-01-31 DIAGNOSIS — Z856 Personal history of leukemia: Secondary | ICD-10-CM | POA: Diagnosis not present

## 2018-01-31 DIAGNOSIS — D696 Thrombocytopenia, unspecified: Secondary | ICD-10-CM

## 2018-01-31 DIAGNOSIS — C9110 Chronic lymphocytic leukemia of B-cell type not having achieved remission: Secondary | ICD-10-CM

## 2018-01-31 LAB — CBC WITH DIFFERENTIAL (CANCER CENTER ONLY)
Abs Immature Granulocytes: 0.02 10*3/uL (ref 0.00–0.07)
BASOS PCT: 1 %
Basophils Absolute: 0 10*3/uL (ref 0.0–0.1)
Eosinophils Absolute: 0.1 10*3/uL (ref 0.0–0.5)
Eosinophils Relative: 2 %
HCT: 42.5 % (ref 36.0–46.0)
Hemoglobin: 13.7 g/dL (ref 12.0–15.0)
Immature Granulocytes: 0 %
Lymphocytes Relative: 48 %
Lymphs Abs: 2.6 10*3/uL (ref 0.7–4.0)
MCH: 27.7 pg (ref 26.0–34.0)
MCHC: 32.2 g/dL (ref 30.0–36.0)
MCV: 85.9 fL (ref 80.0–100.0)
MONO ABS: 0.6 10*3/uL (ref 0.1–1.0)
Monocytes Relative: 11 %
Neutro Abs: 2 10*3/uL (ref 1.7–7.7)
Neutrophils Relative %: 38 %
Platelet Count: 102 10*3/uL — ABNORMAL LOW (ref 150–400)
RBC: 4.95 MIL/uL (ref 3.87–5.11)
RDW: 14.4 % (ref 11.5–15.5)
WBC Count: 5.4 10*3/uL (ref 4.0–10.5)
nRBC: 0 % (ref 0.0–0.2)

## 2018-01-31 LAB — CMP (CANCER CENTER ONLY)
ALK PHOS: 76 U/L (ref 38–126)
ALT: 13 U/L (ref 0–44)
AST: 11 U/L — ABNORMAL LOW (ref 15–41)
Albumin: 4 g/dL (ref 3.5–5.0)
Anion gap: 7 (ref 5–15)
BUN: 13 mg/dL (ref 8–23)
CALCIUM: 9 mg/dL (ref 8.9–10.3)
CO2: 28 mmol/L (ref 22–32)
Chloride: 108 mmol/L (ref 98–111)
Creatinine: 0.74 mg/dL (ref 0.44–1.00)
GFR, Est AFR Am: >60 mL/min (ref >60)
GFR, Estimated: >60 mL/min (ref >60)
Glucose, Bld: 144 mg/dL — ABNORMAL HIGH (ref 70–99)
Potassium: 4 mmol/L (ref 3.5–5.1)
Sodium: 143 mmol/L (ref 135–145)
TOTAL PROTEIN: 6.1 g/dL — AB (ref 6.5–8.1)
Total Bilirubin: 0.6 mg/dL (ref 0.3–1.2)

## 2018-01-31 LAB — URIC ACID: Uric Acid, Serum: 5.9 mg/dL (ref 2.5–7.1)

## 2018-01-31 LAB — LACTATE DEHYDROGENASE: LDH: 230 U/L — ABNORMAL HIGH (ref 98–192)

## 2018-01-31 LAB — SAVE SMEAR(SSMR), FOR PROVIDER SLIDE REVIEW

## 2018-01-31 NOTE — Progress Notes (Signed)
[place]  [phone] [phone]     ID: NOHEMY KOOP DOB: Dec 18, 1940  MR#: [id number]  RCV#:[id number]  Patient Care Team: Lavone Orn, MD as PCP - General (Internal Medicine) Sanda Klein, MD as PCP - Cardiology (Cardiology) Sanda Klein, MD as Consulting Physician (Cardiology) Magrinat, Virgie Dad, MD as Consulting Physician (Oncology) Sabas Sous, MD as Referring Physician (Internal Medicine) OTHER MD:  CHIEF COMPLAINT: Chronic Lymphoid Leukemia  CURRENT TREATMENT: Observation   HISTORY OF CURRENT ILLNESS: ANIQUE BECKLEY has been following up with Dr. Sabas Sous at [facility]. From Dr. Tawanna Sat 03/23/2017 note:  Mrs. Stonehocker is a 78 y.o. [place] woman with chronic lymphocytic leukemia diagnosed in 2001, Rai stage 0. She has been followed at [facility] by Dr. Sabas Sous at [facility], who is retiring (the end of an era).  Dr. Laurance Flatten was seeing Alya on a once a year basis.  She has not required treatment to date.  Most recent labs obtained at [facility] 03/22/2017, showed a hemoglobin of 13.8, MCV 88, white cell count 6.1, platelets 101,000, unchanged since at least August 2007); with IgA 51, IgG 421, and IgM 11.  (The IgG has dropped from 642 August 2010 to 421 March 2019).  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  Beta-2 microglobulin obtained 11/30/2000 was not elevated at 1.9.  Sed rate on the same date was 4  The patient's subsequent history is as detailed below.  INTERVAL HISTORY: Makaelyn is here today for an urgent evaluation of her CLL due to feeling poorly.  She had a fatigue and unwell feeling x 2 months.  She called her cardiologist due to low bps and has increased her fluid  intake and salt intake and her blood pressure has improved.  She has been holding her Metoprolol since then and notes she isn't sure when she should restart it.    REVIEW OF SYSTEMS: Lelaina is feeling well today.  She denies headaches, vision changes, dysphagia, appetite or weight loss, nausea or vomiting.  She is without chest pain, palpitations, cough, shortness of breath.  She hasn't had night sweats.  Her fatigue has resolved.A detailed ROS was otherwise non contributory.    PAST MEDICAL HISTORY: Past Medical History:  Diagnosis Date  . Atrial fibrillation ([place])    questionable, echo 03/23/11 - EF >55%, on warfarin  . Bell's palsy   . 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 Gait disturbance   . Gallstones   . 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 IFG (impaired fasting glucose)    e  . MS (multiple sclerosis) ([place])   . Personal history of other diseases of circulatory system   . Tick bite   . Unspecified hereditary and idiopathic peripheral neuropathy   . Vitamin B12 deficiency   . Vitamin D deficiency   Charcoid-Marie's tooth Disease  PAST SURGICAL HISTORY: Past Surgical History:  Procedure Laterality Date  . ABDOMINAL HYSTERECTOMY  1984   fibroids  . BREAST SURGERY  1960s   benign breast tumors  . CATARACT EXTRACTION W/ INTRAOCULAR LENS  IMPLANT, BILATERAL    . LAPAROSCOPIC BILATERAL SALPINGO OOPHERECTOMY  2003   ovarian cyst  Tonsillectomy as a child.  FAMILY HISTORY Family History  Problem Relation Age of Onset  . Hypertension Mother   . Coronary artery disease Father        died at 43  . Hypertension Father   . Diabetes Father   . Coronary artery disease Brother        died at 58  . Diabetes Brother   . Hypertension Brother   . Cancer Daughter   The patient's mother died at age 47 due to stroke and neurological problems similar to the patient's. The patient's father died at age 45 due to  heart problems and diabetes. The patient had 1 brother who died with heart problems. The patient has 2 sisters, one of whom has diabetes. There was a maternal grandfather with prostate cancer.  The patient denies any other cancers such as lymphoma or myeloma in the family.   GYNECOLOGIC HISTORY:  No LMP recorded. Patient has had a hysterectomy. Menarche: 78 years old Age at first live birth: 78 years old She is GXP3.  LMP: That is post hysterectomy in 1984 fibroids. She used HRT for less than 2 years with no complications.    SOCIAL HISTORY:  Cassy retired in 2001 due to her CLL. She worked at [employer] in [employer]. She and her husband, [name] have been married for 54 years as of June 2019. [name] used to work for [employer], and now he is a Psychologist, occupational at the [employer], trade union. The patient's oldest daughter, [name], works at [employer] in the billing department. The patient's second daughter is [name], who is a principal at [employer]. The patient's son, [name] struggles to keep jobs. At home, is the patient, her husband, and her grandson, 51 y/o Haze Boyden ([name]'s son). [name]'s mother is "not in his life". The patient attends [employer].       ADVANCED DIRECTIVES:    HEALTH MAINTENANCE: Social History   Tobacco Use  . Smoking status: Never Smoker  . Smokeless tobacco: Never Used  Substance Use Topics  . Alcohol use: No    Alcohol/week: 0.0 standard drinks  . Drug use: No     Colonoscopy: refuses   PAP: s/p hysterectomy  Bone density: 2018 was normal  Mammography: "no more" after 2017    Allergies  Allergen Reactions  . Clindamycin Itching and Rash  . Celebrex [Celecoxib] Other (See Comments)    Hypertension, possible TIA  . Statins Other (See Comments)    Muscle aches  . Tape     Patient CAN tolerate Coban Wrap only (NO TAPE!!)  . Tricor [Fenofibrate] Other (See Comments)    Elevated liver enzymes  . Amoxicillin Rash    . Clarithromycin Rash  . Latex Rash    Current Outpatient Medications  Medication Sig Dispense Refill  . apixaban (ELIQUIS) 5 MG TABS tablet Take 1 tablet (5 mg total) by mouth 2 (two) times daily. 60 tablet 5  . cholecalciferol (VITAMIN D) 1000 units tablet Take 1,000 Units by mouth daily.    . Cyanocobalamin (VITAMIN B 12 PO) Take 100 mg by mouth daily.     . metoprolol tartrate (LOPRESSOR) 25 MG tablet TAKE 1/2 TABLET BY MOUTH TWICE DAILY, MAY TAKE AN ADDITIONAL 1/2 TABLET EVERY DAY AS NEEDED FOR PALPITATIONS 45 tablet 0  . montelukast (SINGULAIR) 10 MG tablet Take 10 mg by mouth daily as needed (AS DIRECTED BY PHYSICIAN).      No current facility-administered medications for this visit.     OBJECTIVE:  Vitals:  01/31/18 1424  BP: 136/70  Pulse: 88  Resp: 18  Temp: 98.4 F (36.9 C)  SpO2: 98%     Body mass index is 31.14 kg/m.   Wt Readings from Last 3 Encounters:  01/31/18 198 lb 12.8 oz (90.2 kg)  01/25/18 197 lb (89.4 kg)  10/22/17 193 lb 12.8 oz (87.9 kg)  ECOG FS:1 - Symptomatic but completely ambulatory GENERAL: Patient is a well appearing female in no acute distress HEENT:  Sclerae anicteric.  Oropharynx clear and moist. No ulcerations or evidence of oropharyngeal candidiasis. Neck is supple.  NODES:  No cervical, supraclavicular, or axillary lymphadenopathy palpated.  LUNGS:  Clear to auscultation bilaterally.  No wheezes or rhonchi. HEART:  Regular rate and rhythm. No murmur appreciated. ABDOMEN:  Soft, nontender.  Positive, normoactive bowel sounds. No organomegaly palpated. MSK:  No focal spinal tenderness to palpation. Full range of motion bilaterally in the upper extremities. EXTREMITIES:  No peripheral edema.   SKIN:  Clear with no obvious rashes or skin changes. No nail dyscrasia. NEURO:  Nonfocal. Well oriented.  Appropriate affect.     LAB RESULTS:  CMP     Component Value Date/Time   NA 143 01/31/2018 1358   K 4.0 01/31/2018 1358   CL 108  01/31/2018 1358   CO2 28 01/31/2018 1358   GLUCOSE 144 (H) 01/31/2018 1358   BUN 13 01/31/2018 1358   CREATININE 0.74 01/31/2018 1358   CALCIUM 9.0 01/31/2018 1358   PROT 6.1 (L) 01/31/2018 1358   ALBUMIN 4.0 01/31/2018 1358   AST 11 (L) 01/31/2018 1358   ALT 13 01/31/2018 1358   ALKPHOS 76 01/31/2018 1358   BILITOT 0.6 01/31/2018 1358   GFRNONAA >60 01/31/2018 1358   GFRAA >60 01/31/2018 1358    No results found for: TOTALPROTELP, ALBUMINELP, A1GS, A2GS, BETS, BETA2SER, GAMS, MSPIKE, SPEI  No results found for: KPAFRELGTCHN, LAMBDASER, KAPLAMBRATIO  Lab Results  Component Value Date   WBC 5.4 01/31/2018   NEUTROABS 2.0 01/31/2018   HGB 13.7 01/31/2018   HCT 42.5 01/31/2018   MCV 85.9 01/31/2018   PLT 102 (L) 01/31/2018    '@LASTCHEMISTRY'$ @  No results found for: LABCA2  No components found for: MVHQIO962  No results for input(s): INR in the last 168 hours.  No results found for: LABCA2  No results found for: XBM841  No results found for: LKG401  No results found for: UUV253  No results found for: CA2729  No components found for: HGQUANT  No results found for: CEA1 / No results found for: CEA1   No results found for: AFPTUMOR  No results found for: CHROMOGRNA  No results found for: PSA1  Appointment on 01/31/2018  Component Date Value Ref Range Status  . Smear Review 01/31/2018 SMEAR STAINED AND AVAILABLE FOR REVIEW   Final   Performed at [facility], [address], [place], [place] [address]  . Uric Acid, Serum 01/31/2018 5.9  2.5 - 7.1 mg/dL Final   Performed at [facility], [place] [address], [place], [place] [address]  . LDH 01/31/2018 230* 98 - 192 U/L Final   Performed at [facility], [place] [address] [place], [place] [address]  . Sodium 01/31/2018 143  135 - 145 mmol/L Final  . Potassium 01/31/2018 4.0  3.5 - 5.1 mmol/L Final  . Chloride 01/31/2018 108  98 - 111 mmol/L  Final  . CO2 01/31/2018 28  22 - 32 mmol/L Final  .  Glucose, Bld 01/31/2018 144* 70 - 99 mg/dL Final  . BUN 01/31/2018 13  8 - 23 mg/dL Final  . Creatinine 01/31/2018 0.74  0.44 - 1.00 mg/dL Final  . Calcium 01/31/2018 9.0  8.9 - 10.3 mg/dL Final  . Total Protein 01/31/2018 6.1* 6.5 - 8.1 g/dL Final  . Albumin 01/31/2018 4.0  3.5 - 5.0 g/dL Final  . AST 01/31/2018 11* 15 - 41 U/L Final  . ALT 01/31/2018 13  0 - 44 U/L Final  . Alkaline Phosphatase 01/31/2018 76  38 - 126 U/L Final  . Total Bilirubin 01/31/2018 0.6  0.3 - 1.2 mg/dL Final  . GFR, Est Non Af Am 01/31/2018 >60  >60 mL/min Final  . GFR, Est AFR Am 01/31/2018 >60  >60 mL/min Final  . Anion gap 01/31/2018 7  5 - 15 Final   Performed at [facility], [place] [address] [place], [place] [address]  . WBC Count 01/31/2018 5.4  4.0 - 10.5 K/uL Final  . RBC 01/31/2018 4.95  3.87 - 5.11 MIL/uL Final  . Hemoglobin 01/31/2018 13.7  12.0 - 15.0 g/dL Final  . HCT 01/31/2018 42.5  36.0 - 46.0 % Final  . MCV 01/31/2018 85.9  80.0 - 100.0 fL Final  . MCH 01/31/2018 27.7  26.0 - 34.0 pg Final  . MCHC 01/31/2018 32.2  30.0 - 36.0 g/dL Final  . RDW 01/31/2018 14.4  11.5 - 15.5 % Final  . Platelet Count 01/31/2018 102* 150 - 400 K/uL Final  . nRBC 01/31/2018 0.0  0.0 - 0.2 % Final  . Neutrophils Relative % 01/31/2018 38  % Final  . Neutro Abs 01/31/2018 2.0  1.7 - 7.7 K/uL Final  . Lymphocytes Relative 01/31/2018 48  % Final  . Lymphs Abs 01/31/2018 2.6  0.7 - 4.0 K/uL Final  . Monocytes Relative 01/31/2018 11  % Final  . Monocytes Absolute 01/31/2018 0.6  0.1 - 1.0 K/uL Final  . Eosinophils Relative 01/31/2018 2  % Final  . Eosinophils Absolute 01/31/2018 0.1  0.0 - 0.5 K/uL Final  . Basophils Relative 01/31/2018 1  % Final  . Basophils Absolute 01/31/2018 0.0  0.0 - 0.1 K/uL Final  . Immature Granulocytes 01/31/2018 0  % Final  . Abs Immature Granulocytes 01/31/2018 0.02  0.00 - 0.07 K/uL Final   Performed at  [facility], [place] [address], [place], [place] [address]    (this displays the last labs from the last 3 days)  No results found for: TOTALPROTELP, ALBUMINELP, A1GS, A2GS, BETS, BETA2SER, GAMS, MSPIKE, SPEI (this displays SPEP labs)  No results found for: KPAFRELGTCHN, LAMBDASER, KAPLAMBRATIO (kappa/lambda light chains)  No results found for: HGBA, HGBA2QUANT, HGBFQUANT, HGBSQUAN (Hemoglobinopathy evaluation)   Lab Results  Component Value Date   LDH 230 (H) 01/31/2018    No results found for: IRON, TIBC, IRONPCTSAT (Iron and TIBC)  No results found for: FERRITIN  Urinalysis    Component Value Date/Time   COLORURINE YELLOW 05/13/2009 [place] 05/13/2009 0821   LABSPEC 1.009 05/13/2009 0821   PHURINE 7.0 05/13/2009 [place] 05/13/2009 0821   HGBUR NEGATIVE 05/13/2009 [place] 05/13/2009 0821   KETONESUR NEGATIVE 05/13/2009 0821   PROTEINUR NEGATIVE 05/13/2009 0821   UROBILINOGEN 0.2 05/13/2009 0821   NITRITE NEGATIVE 05/13/2009 0821   LEUKOCYTESUR TRACE (A) 05/13/2009 0821     STUDIES: Outside records reviewed with the patient  ELIGIBLE [place]  PROTOCOL: no  ASSESSMENT: 78 y.o. [place], [place] woman with chronic lymphoid leukemia initially diagnosed 2001, Rai stage 0; CD 20 and CD5 positive, CD23 and CD10 negative, lambda restricted; FISH studies February 2015 showing trisomy 12 and deletion 13 (heterozygous), but no t(11,14) and unremarkable TP53 and ATM;  not requiring treatment to date  (1) total IgG less than 450 as of March 2019  (2) moderate thrombocytopenia, stable  PLAN:  Tashay is doing better today.  Her CBC is normal today and shows no evidence of worsening of her CLL.  She has improved since following Dr. Victorino December instructions.  She will continue to hold her metoprolol and f/u with cardiology on when she should restart it.  She has an appointment with cardiology  tomorrow.  Jayci will see [place] back in April of this year for labs and f/u with Dr. Jana Hakim.  She knows to call for any other issues that may develop before the next visit.  A total of (20) minutes of face-to-face time was spent with this patient with greater than 50% of that time in counseling and care-coordination.   Wilber Bihari, NP  01/31/18 3:10 PM Medical Oncology and Hematology [facility] [address] [place], [place] [address] Tel. [phone]    Fax. [phone]

## 2018-02-01 ENCOUNTER — Ambulatory Visit: Payer: Medicare Other | Admitting: Adult Health

## 2018-02-01 ENCOUNTER — Telehealth: Payer: Self-pay | Admitting: Adult Health

## 2018-02-01 LAB — IGG, IGA, IGM
IGM (IMMUNOGLOBULIN M), SRM: 11 mg/dL — AB (ref 26–217)
IgA: 47 mg/dL — ABNORMAL LOW (ref 64–422)
IgG (Immunoglobin G), Serum: 441 mg/dL — ABNORMAL LOW (ref 700–1600)

## 2018-02-01 NOTE — Telephone Encounter (Signed)
Per 11/3 no los °

## 2018-03-24 ENCOUNTER — Other Ambulatory Visit: Payer: Self-pay | Admitting: Family Medicine

## 2018-03-24 MED ORDER — OSELTAMIVIR PHOSPHATE 75 MG PO CAPS: 75.0000 mg | ORAL_CAPSULE | Freq: Two times a day (BID) | ORAL | 0 refills | Status: DC

## 2018-03-27 DIAGNOSIS — J069 Acute upper respiratory infection, unspecified: Secondary | ICD-10-CM | POA: Diagnosis not present

## 2018-04-07 DIAGNOSIS — J069 Acute upper respiratory infection, unspecified: Secondary | ICD-10-CM | POA: Diagnosis not present

## 2018-04-16 ENCOUNTER — Other Ambulatory Visit: Payer: Self-pay | Admitting: Cardiovascular Disease

## 2018-04-26 ENCOUNTER — Telehealth: Payer: Self-pay | Admitting: Oncology

## 2018-04-26 NOTE — Telephone Encounter (Signed)
Per 4/6 schedule message moved from 4/13 to 8/24. Confirmed with patient.

## 2018-05-02 ENCOUNTER — Other Ambulatory Visit: Payer: Medicare Other

## 2018-05-02 ENCOUNTER — Ambulatory Visit: Payer: Medicare Other | Admitting: Oncology

## 2018-05-31 ENCOUNTER — Ambulatory Visit: Payer: Medicare Other | Admitting: Cardiovascular Disease

## 2018-09-09 ENCOUNTER — Other Ambulatory Visit: Payer: Self-pay | Admitting: *Deleted

## 2018-09-11 ENCOUNTER — Other Ambulatory Visit: Payer: Self-pay | Admitting: Oncology

## 2018-09-11 DIAGNOSIS — C9110 Chronic lymphocytic leukemia of B-cell type not having achieved remission: Secondary | ICD-10-CM

## 2018-09-11 NOTE — Progress Notes (Signed)
Tasha [name]  [phone] Fax:([phone]     ID: Tasha [name] DOB: 1940-11-19  MR#: [id number]  YJE#:[id number]  Patient Care Team: Tasha Orn, MD as PCP - General (Internal Medicine) Tasha Klein, MD as PCP - Cardiology (Cardiology) Tasha Klein, MD as Consulting Physician (Cardiology) [name], Tasha Dad, MD as Consulting Physician (Oncology) Tasha Sous, MD as Referring Physician (Internal Medicine) OTHER MD:  CHIEF COMPLAINT: Chronic Lymphoid Leukemia  CURRENT TREATMENT: Observation   INTERVAL HISTORY: Tasha [name] returns today for follow up of her chronic lymphoid leukemia. She continues under observation.   She tells me her grandson whom she keeps was diagnosed with the flu and that she took Tamiflu and had some symptoms which have resolved.  She underwent echocardiogram on 11/01/2017, which showed an ejection fractions of 65-70%.  She has sweats when she exercises at all.  She is very limited in what she can do because of the problems with both legs.  She could barely climb onto the examination table today for example.  She does not think she could do an exercise bike and she does not swim.  Otherwise she has had no "B" symptoms.  REVIEW OF SYSTEMS: A detailed review of systems today was otherwise noncontributory  HISTORY OF CURRENT ILLNESS: Tasha [name] has been following up with Dr. Sabas [name] at [facility]. From Dr. Tawanna [name] 03/23/2017 note:  Tasha [name] is a 78 y.o. [place] woman with chronic lymphocytic leukemia diagnosed in 2001, Rai stage 0. She has been followed at [facility] by Dr. Sabas [name] at [facility], who is retiring (the end of an era).  Tasha [name] was seeing Tasha [name] on a once a year basis.  She has not required treatment to date.  Most recent labs obtained at [facility] 03/22/2017, showed a hemoglobin of 13.8, MCV 88, white cell count 6.1, platelets 101,000, unchanged since at least August 2007); with IgA 51, IgG 421, and IgM 11.   (The IgG has dropped from 642 August 2010 to 421 March 2019).  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  Beta-2 microglobulin obtained 11/30/2000 was not elevated at 1.9.  Sed rate on the same date was 4  The patient's subsequent history is as detailed below.   PAST MEDICAL HISTORY: Past Medical History:  Diagnosis Date  . Atrial fibrillation ([place])    questionable, echo 03/23/11 - EF >55%, on warfarin  . Bell's palsy   . 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 Gait disturbance   . Gallstones   . 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 IFG (impaired fasting glucose)    e  . MS (multiple sclerosis) ([place])   . Personal history of other diseases of circulatory system   . Tick bite   . Unspecified hereditary and idiopathic peripheral neuropathy   . Vitamin B12 deficiency   . Vitamin D deficiency   Charcoid-Marie's tooth Disease   PAST SURGICAL HISTORY: Past Surgical History:  Procedure Laterality Date  . ABDOMINAL HYSTERECTOMY  1984   fibroids  . BREAST SURGERY  1960s   benign breast tumors  . CATARACT EXTRACTION W/ INTRAOCULAR LENS  IMPLANT, BILATERAL    . LAPAROSCOPIC BILATERAL SALPINGO OOPHERECTOMY  2003  ovarian cyst  Tonsillectomy as a child.    FAMILY HISTORY Family History  Problem Relation Age of Onset  . Hypertension Mother   . Coronary artery disease Father        died at 50  . Hypertension Father   . Diabetes Father   . Coronary artery disease Brother        died at 14  . Diabetes Brother   . Hypertension Brother   . Cancer Daughter   The patient's mother died at age 30 due to stroke and neurological problems similar to the  patient's. The patient's father died at age 56 due to heart problems and diabetes. The patient had 1 brother who died with heart problems. The patient has 2 sisters, one of whom has diabetes. There was a maternal grandfather with prostate cancer.  The patient denies any other cancers such as lymphoma or myeloma in the family.    GYNECOLOGIC HISTORY:  No LMP recorded. Patient has had a hysterectomy. Menarche: 78 years old Age at first live birth: 78 years old She is GXP3.  LMP: That is post hysterectomy in 1984 fibroids. She used HRT for less than 2 years with no complications.    SOCIAL HISTORY: (Updated August 2020). Tasha [name] retired in 2001 due to her CLL. She worked at [employer] in [employer]. She and her husband, Tasha [name] have been married for 15 years as of August 2020. Tasha [name] used to work for [employer], and now he is a Psychologist, occupational at the [employer], trade union. The patient's oldest daughter, Tasha [name], works at [employer] in the billing department and was recently promoted to IT. The patient's second daughter is Tasha [name], who is a principal at [employer]. The patient's son, Tasha [name] is currently employed.. At home, is the patient, her husband, and her grandson, 77 y/o Tasha [name] (Tasha [name]'s son), who is doing very well academically and has a strong interest in math. Tasha [name]'s mother is "not in his life". The patient attends [facility].      HEALTH MAINTENANCE: Social History   Tobacco Use  . Smoking status: Never Smoker  . Smokeless tobacco: Never Used  Substance Use Topics  . Alcohol use: No    Alcohol/week: 0.0 standard drinks  . Drug use: No     Colonoscopy: refuses   PAP: s/p hysterectomy  Bone density: 2018 was normal  Mammography: "no more" after 2017 per her PCP   Allergies  Allergen Reactions  . Clindamycin Itching and Rash  . Celebrex [Celecoxib] Other (See Comments)    Hypertension, possible TIA  . Statins Other (See Comments)     Muscle aches  . Tape     Patient CAN tolerate Coban Wrap only (NO TAPE!!)  . Tricor [Fenofibrate] Other (See Comments)    Elevated liver enzymes  . Amoxicillin Rash  . Clarithromycin Rash  . Latex Rash    Current Outpatient Medications  Medication Sig Dispense Refill  . cholecalciferol (VITAMIN D) 1000 units tablet Take 1,000 Units by mouth daily.    . Cyanocobalamin (VITAMIN B 12 PO) Take 100 mg by mouth daily.     Marland Kitchen ELIQUIS 5 MG TABS tablet TAKE 1 TABLET(5 MG) BY MOUTH TWICE DAILY 180 tablet 1  . metoprolol tartrate (LOPRESSOR) 25 MG tablet TAKE 1/2 TABLET BY MOUTH TWICE DAILY, MAY TAKE AN ADDITIONAL 1/2 TABLET EVERY DAY AS NEEDED FOR PALPITATIONS (Patient not taking: Reported on 09/12/2018) 45 tablet 0  . montelukast (SINGULAIR) 10 MG tablet Take 10 mg by  mouth daily as needed (AS DIRECTED BY PHYSICIAN).      No current facility-administered medications for this visit.     OBJECTIVE: Older white woman who appears stated age 78:   09/12/18 1115  BP: (!) 125/54  Pulse: 80  Resp: 16  Temp: 98.7 F (37.1 C)  SpO2: 97%     There is no height or weight on file to calculate BMI.   Wt Readings from Last 3 Encounters:  01/31/18 198 lb 12.8 oz (90.2 kg)  01/25/18 197 lb (89.4 kg)  10/22/17 193 lb 12.8 oz (87.9 kg)  ECOG FS:1 - Symptomatic but completely ambulatory  Sclerae unicteric, EOMs intact Wearing a mask No cervical or supraclavicular adenopathy, no axillary or inguinal adenopathy. Lungs no rales or rhonchi Heart regular rate and rhythm Abd soft, nontender, positive bowel sounds MSK no focal spinal tenderness, no upper extremity lymphedema Neuro: nonfocal, well oriented, appropriate affect Breasts: Deferred  LAB RESULTS:  CMP     Component Value Date/Time   NA 141 09/12/2018 1037   K 4.2 09/12/2018 1037   CL 109 09/12/2018 1037   CO2 24 09/12/2018 1037   GLUCOSE 104 (H) 09/12/2018 1037   BUN 12 09/12/2018 1037   CREATININE 0.72 09/12/2018 1037   CREATININE  0.74 01/31/2018 1358   CALCIUM 9.3 09/12/2018 1037   PROT 6.3 (L) 09/12/2018 1037   ALBUMIN 4.2 09/12/2018 1037   AST 14 (L) 09/12/2018 1037   AST 11 (L) 01/31/2018 1358   ALT 13 09/12/2018 1037   ALT 13 01/31/2018 1358   ALKPHOS 73 09/12/2018 1037   BILITOT 0.7 09/12/2018 1037   BILITOT 0.6 01/31/2018 1358   GFRNONAA >60 09/12/2018 1037   GFRNONAA >60 01/31/2018 1358   GFRAA >60 09/12/2018 1037   GFRAA >60 01/31/2018 1358    No results found for: TOTALPROTELP, ALBUMINELP, A1GS, A2GS, BETS, BETA2SER, GAMS, MSPIKE, SPEI  No results found for: KPAFRELGTCHN, LAMBDASER, KAPLAMBRATIO  Lab Results  Component Value Date   WBC 5.0 09/12/2018   NEUTROABS 2.1 09/12/2018   HGB 13.9 09/12/2018   HCT 42.8 09/12/2018   MCV 85.6 09/12/2018   PLT 98 (L) 09/12/2018    _0 @  No results found for: LABCA2  No components found for: [id number]  No results for input(s): INR in the last 168 hours.  No results found for: LABCA2  No results found for: BPP943  No results found for: EXM147  No results found for: WLK957  No results found for: CA2729  No components found for: HGQUANT  No results found for: CEA1 / No results found for: CEA1   No results found for: AFPTUMOR  No results found for: CHROMOGRNA  No results found for: PSA1  Appointment on 09/12/2018  Component Date Value Ref Range Status  . Smear Review 09/12/2018 SMEAR STAINED AND AVAILABLE FOR REVIEW   Final   Performed at [facility], [address], [place], [place] [address]  . Sodium 09/12/2018 141  135 - 145 mmol/L Final  . Potassium 09/12/2018 4.2  3.5 - 5.1 mmol/L Final  . Chloride 09/12/2018 109  98 - 111 mmol/L Final  . CO2 09/12/2018 24  22 - 32 mmol/L Final  . Glucose, Bld 09/12/2018 104* 70 - 99 mg/dL Final  . BUN 09/12/2018 12  8 - 23 mg/dL Final  . Creatinine, Ser 09/12/2018 0.72  0.44 - 1.00 mg/dL Final  . Calcium 09/12/2018 9.3  8.9 - 10.3 mg/dL Final  . Total  Protein  09/12/2018 6.3* 6.5 - 8.1 g/dL Final  . Albumin 09/12/2018 4.2  3.5 - 5.0 g/dL Final  . AST 09/12/2018 14* 15 - 41 U/L Final  . ALT 09/12/2018 13  0 - 44 U/L Final  . Alkaline Phosphatase 09/12/2018 73  38 - 126 U/L Final  . Total Bilirubin 09/12/2018 0.7  0.3 - 1.2 mg/dL Final  . GFR calc non Af Amer 09/12/2018 >60  >60 mL/min Final  . GFR calc Af Amer 09/12/2018 >60  >60 mL/min Final  . Anion gap 09/12/2018 8  5 - 15 Final   Performed at [facility], [place] [address] [place], [place] [address]  . WBC 09/12/2018 5.0  4.0 - 10.5 K/uL Final  . RBC 09/12/2018 5.00  3.87 - 5.11 MIL/uL Final  . Hemoglobin 09/12/2018 13.9  12.0 - 15.0 g/dL Final  . HCT 09/12/2018 42.8  36.0 - 46.0 % Final  . MCV 09/12/2018 85.6  80.0 - 100.0 fL Final  . MCH 09/12/2018 27.8  26.0 - 34.0 pg Final  . MCHC 09/12/2018 32.5  30.0 - 36.0 g/dL Final  . RDW 09/12/2018 14.6  11.5 - 15.5 % Final  . Platelets 09/12/2018 98* 150 - 400 K/uL Final  . nRBC 09/12/2018 0.0  0.0 - 0.2 % Final  . Neutrophils Relative % 09/12/2018 42  % Final  . Neutro Abs 09/12/2018 2.1  1.7 - 7.7 K/uL Final  . Lymphocytes Relative 09/12/2018 41  % Final  . Lymphs Abs 09/12/2018 2.0  0.7 - 4.0 K/uL Final  . Monocytes Relative 09/12/2018 11  % Final  . Monocytes Absolute 09/12/2018 0.6  0.1 - 1.0 K/uL Final  . Eosinophils Relative 09/12/2018 5  % Final  . Eosinophils Absolute 09/12/2018 0.3  0.0 - 0.5 K/uL Final  . Basophils Relative 09/12/2018 1  % Final  . Basophils Absolute 09/12/2018 0.0  0.0 - 0.1 K/uL Final  . Immature Granulocytes 09/12/2018 0  % Final  . Abs Immature Granulocytes 09/12/2018 0.01  0.00 - 0.07 K/uL Final   Performed at [facility], [place] [address], [place], [place] [address]    (this displays the last labs from the last 3 days)  No results found for: TOTALPROTELP, ALBUMINELP, A1GS, A2GS, BETS, BETA2SER, GAMS, MSPIKE, SPEI (this displays SPEP labs)  No  results found for: KPAFRELGTCHN, LAMBDASER, KAPLAMBRATIO (kappa/lambda light chains)  No results found for: HGBA, HGBA2QUANT, HGBFQUANT, HGBSQUAN (Hemoglobinopathy evaluation)   Lab Results  Component Value Date   LDH 230 (H) 01/31/2018    No results found for: IRON, TIBC, IRONPCTSAT (Iron and TIBC)  No results found for: FERRITIN  Urinalysis    Component Value Date/Time   COLORURINE YELLOW 05/13/2009 [name] 05/13/2009 0821   LABSPEC 1.009 05/13/2009 0821   PHURINE 7.0 05/13/2009 0821   GLUCOSEU NEGATIVE 05/13/2009 0821   HGBUR NEGATIVE 05/13/2009 0821   BILIRUBINUR NEGATIVE 05/13/2009 0821   KETONESUR NEGATIVE 05/13/2009 0821   PROTEINUR NEGATIVE 05/13/2009 0821   UROBILINOGEN 0.2 05/13/2009 0821   NITRITE NEGATIVE 05/13/2009 0821   LEUKOCYTESUR TRACE (A) 05/13/2009 0821     STUDIES: No results found.   ELIGIBLE FOR AVAILABLE RESEARCH PROTOCOL: no  ASSESSMENT: 78 y.o. [place], [place] woman with chronic lymphoid leukemia initially diagnosed 2001, Rai stage 0; CD 20 and CD5 positive, CD23 and CD10 negative, lambda restricted; FISH studies February 2015 showing trisomy 12 and deletion 41 (heterozygous), but no t(11,14) and unremarkable TP53 and ATM;  not requiring treatment to date  (1) total IgG less than 450 as of March 2019  (2) moderate thrombocytopenia, stable  PLAN: Tasha [name] is 19 years out from initial diagnosis of chronic lymphoid leukemia with no need for treatment so far.  She has moderate thrombocytopenia which requires no intervention.  She would not need platelet transfusion if she needed to undergo any interventional procedure.  I wish she were able to exercise but that apparently is not something that she can do at this point because of her leg issues.  She is doing a great job raising her grandson and they are taking appropriate pandemic precautions  Her immunoglobulin level is moderately low and if she does develop an infection she  may benefit from an IgG infusion.  She will see me again in a year.  She knows to call for any issues that may develop before that visit.  Tasha Dad. Magrinat, MD  09/12/18 11:35 AM Medical Oncology and Hematology [facility] [address] [place], [place] [address] Tel. [phone]    Fax. [phone]   I, Wilburn Mylar, am acting as scribe for Dr. Virgie Dad. [name].  I, Lurline Del MD, have reviewed the above documentation for accuracy and completeness, and I agree with the above.

## 2018-09-12 ENCOUNTER — Other Ambulatory Visit: Payer: Self-pay

## 2018-09-12 ENCOUNTER — Inpatient Hospital Stay (HOSPITAL_BASED_OUTPATIENT_CLINIC_OR_DEPARTMENT_OTHER): Payer: Medicare Other | Admitting: Oncology

## 2018-09-12 ENCOUNTER — Inpatient Hospital Stay: Payer: Medicare Other | Attending: Oncology

## 2018-09-12 VITALS — BP 125/54 | HR 80 | Temp 98.7°F | Resp 16

## 2018-09-12 DIAGNOSIS — I4891 Unspecified atrial fibrillation: Secondary | ICD-10-CM | POA: Diagnosis not present

## 2018-09-12 DIAGNOSIS — Z79899 Other long term (current) drug therapy: Secondary | ICD-10-CM | POA: Insufficient documentation

## 2018-09-12 DIAGNOSIS — C9110 Chronic lymphocytic leukemia of B-cell type not having achieved remission: Secondary | ICD-10-CM | POA: Insufficient documentation

## 2018-09-12 DIAGNOSIS — D696 Thrombocytopenia, unspecified: Secondary | ICD-10-CM | POA: Diagnosis not present

## 2018-09-12 DIAGNOSIS — G35 Multiple sclerosis: Secondary | ICD-10-CM | POA: Diagnosis not present

## 2018-09-12 DIAGNOSIS — Z7901 Long term (current) use of anticoagulants: Secondary | ICD-10-CM | POA: Diagnosis not present

## 2018-09-12 LAB — CBC WITH DIFFERENTIAL/PLATELET
Abs Immature Granulocytes: 0.01 10*3/uL (ref 0.00–0.07)
Basophils Absolute: 0 10*3/uL (ref 0.0–0.1)
Basophils Relative: 1 %
Eosinophils Absolute: 0.3 10*3/uL (ref 0.0–0.5)
Eosinophils Relative: 5 %
HCT: 42.8 % (ref 36.0–46.0)
Hemoglobin: 13.9 g/dL (ref 12.0–15.0)
Immature Granulocytes: 0 %
Lymphocytes Relative: 41 %
Lymphs Abs: 2 10*3/uL (ref 0.7–4.0)
MCH: 27.8 pg (ref 26.0–34.0)
MCHC: 32.5 g/dL (ref 30.0–36.0)
MCV: 85.6 fL (ref 80.0–100.0)
Monocytes Absolute: 0.6 10*3/uL (ref 0.1–1.0)
Monocytes Relative: 11 %
Neutro Abs: 2.1 10*3/uL (ref 1.7–7.7)
Neutrophils Relative %: 42 %
Platelets: 98 10*3/uL — ABNORMAL LOW (ref 150–400)
RBC: 5 MIL/uL (ref 3.87–5.11)
RDW: 14.6 % (ref 11.5–15.5)
WBC: 5 10*3/uL (ref 4.0–10.5)
nRBC: 0 % (ref 0.0–0.2)

## 2018-09-12 LAB — COMPREHENSIVE METABOLIC PANEL
ALT: 13 U/L (ref 0–44)
AST: 14 U/L — ABNORMAL LOW (ref 15–41)
Albumin: 4.2 g/dL (ref 3.5–5.0)
Alkaline Phosphatase: 73 U/L (ref 38–126)
Anion gap: 8 (ref 5–15)
BUN: 12 mg/dL (ref 8–23)
CO2: 24 mmol/L (ref 22–32)
Calcium: 9.3 mg/dL (ref 8.9–10.3)
Chloride: 109 mmol/L (ref 98–111)
Creatinine, Ser: 0.72 mg/dL (ref 0.44–1.00)
GFR calc Af Amer: >60 mL/min (ref >60)
GFR calc non Af Amer: >60 mL/min (ref >60)
Glucose, Bld: 104 mg/dL — ABNORMAL HIGH (ref 70–99)
Potassium: 4.2 mmol/L (ref 3.5–5.1)
Sodium: 141 mmol/L (ref 135–145)
Total Bilirubin: 0.7 mg/dL (ref 0.3–1.2)
Total Protein: 6.3 g/dL — ABNORMAL LOW (ref 6.5–8.1)

## 2018-09-12 LAB — SAVE SMEAR(SSMR), FOR PROVIDER SLIDE REVIEW

## 2018-09-12 LAB — LACTATE DEHYDROGENASE: LDH: 256 U/L — ABNORMAL HIGH (ref 98–192)

## 2018-09-13 ENCOUNTER — Telehealth: Payer: Self-pay | Admitting: Oncology

## 2018-09-13 LAB — IGG, IGA, IGM
IgA: 50 mg/dL — ABNORMAL LOW (ref 64–422)
IgG (Immunoglobin G), Serum: 408 mg/dL — ABNORMAL LOW (ref 586–1602)
IgM (Immunoglobulin M), Srm: 13 mg/dL — ABNORMAL LOW (ref 26–217)

## 2018-09-13 LAB — BETA 2 MICROGLOBULIN, SERUM: Beta-2 Microglobulin: 2.7 mg/L — ABNORMAL HIGH (ref 0.6–2.4)

## 2018-09-13 NOTE — Telephone Encounter (Signed)
I talk with patient regarding schedule  

## 2018-09-21 ENCOUNTER — Other Ambulatory Visit: Payer: Self-pay

## 2018-09-21 ENCOUNTER — Ambulatory Visit (INDEPENDENT_AMBULATORY_CARE_PROVIDER_SITE_OTHER): Payer: Medicare Other | Admitting: Cardiovascular Disease

## 2018-09-21 ENCOUNTER — Encounter: Payer: Self-pay | Admitting: Cardiovascular Disease

## 2018-09-21 VITALS — BP 140/73 | HR 87 | Temp 97.3°F | Ht 67.0 in | Wt 199.6 lb

## 2018-09-21 DIAGNOSIS — Z7901 Long term (current) use of anticoagulants: Secondary | ICD-10-CM | POA: Diagnosis not present

## 2018-09-21 DIAGNOSIS — I471 Supraventricular tachycardia: Secondary | ICD-10-CM

## 2018-09-21 DIAGNOSIS — I480 Paroxysmal atrial fibrillation: Secondary | ICD-10-CM | POA: Diagnosis not present

## 2018-09-21 DIAGNOSIS — I959 Hypotension, unspecified: Secondary | ICD-10-CM | POA: Diagnosis not present

## 2018-09-21 NOTE — Progress Notes (Signed)
Cardiology Office Note:    Date:  09/21/2018   ID:  Tasha [name], DOB 28-Jun-1940, MRN [id number]  PCP:  Lavone Orn, MD  Cardiologist:  Sanda Klein, MD    Referring MD: Lavone Orn, MD   Chief complaint: atrial fibrillation   History of Present Illness:    Tasha [name] is a 79 y.o. female with a hx of paroxysmal atrial tachycardia, and atrial fibrillation.  Additional medical problems include CLL with mild thrombocytopenia, multiple sclerosis with very infrequent problems, ocular migraines and idiopathic peripheral neuropathy.   She stopped taking the metoprolol and actually has fewer palpitations.   The patient specifically denies any chest pain at rest exertion, dyspnea at rest or with exertion, orthopnea, paroxysmal nocturnal dyspnea, syncope, palpitations, focal neurological deficits, intermittent claudication, lower extremity edema, unexplained weight gain, cough, hemoptysis or wheezing. No falls/injuries/bleeding, except when she has mosquito bites and scratches.  She does not have known structural heart disease. She had a normal echo in 2013 at [facility] (normal LV systolic function, mild LVH with septal wall thickness 1.2 cm, borderline left atrial dilation at 3.9 cm, 25 cm). She does not have known coronary artery problems.  Past Medical History:  Diagnosis Date  . Atrial fibrillation ([place])    questionable, echo 03/23/11 - EF >55%, on warfarin  . Bell's palsy   . 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 Gait disturbance   . Gallstones   . 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 IFG (impaired fasting glucose)    e  . MS (multiple sclerosis) ([place])   . Personal history of other diseases of circulatory system   . Tick bite   . Unspecified hereditary and idiopathic peripheral neuropathy   . Vitamin B12 deficiency   . Vitamin D deficiency     Past Surgical History:  Procedure Laterality Date  .  ABDOMINAL HYSTERECTOMY  1984   fibroids  . BREAST SURGERY  1960s   benign breast tumors  . CATARACT EXTRACTION W/ INTRAOCULAR LENS  IMPLANT, BILATERAL    . LAPAROSCOPIC BILATERAL SALPINGO OOPHERECTOMY  2003   ovarian cyst    Current Medications: Current Meds  Medication Sig  . cholecalciferol (VITAMIN D) 1000 units tablet Take 1,000 Units by mouth daily.  . Cyanocobalamin (VITAMIN B 12 PO) Take 100 mg by mouth daily.   Marland Kitchen ELIQUIS 5 MG TABS tablet TAKE 1 TABLET(5 MG) BY MOUTH TWICE DAILY  . montelukast (SINGULAIR) 10 MG tablet Take 10 mg by mouth daily as needed (AS DIRECTED BY PHYSICIAN).      Allergies:   Clindamycin, Celebrex [celecoxib], Statins, Tape, Tricor [fenofibrate], Amoxicillin, Clarithromycin, and Latex   Social History   Socioeconomic History  . Marital status: Married    Spouse name: Colen  . Number of children: 3  . Years of education: college  . Highest education level: Not on file  Occupational History    Comment: Retired  Scientific laboratory technician  . Financial resource strain: Not on file  . Food insecurity    Worry: Not on file    Inability: Not on file  . Transportation needs    Medical: Not on file    Non-medical: Not on file  Tobacco Use  . Smoking status: Never Smoker  . Smokeless tobacco: Never Used  Substance and Sexual Activity  . Alcohol use: No    Alcohol/week: 0.0 standard drinks  . Drug use: No  . Sexual activity: Not on  file  Lifestyle  . Physical activity    Days per week: Not on file    Minutes per session: Not on file  . Stress: Not on file  Relationships  . Social Herbalist on phone: Not on file    Gets together: Not on file    Attends religious service: Not on file    Active member of club or organization: Not on file    Attends meetings of clubs or organizations: Not on file    Relationship status: Not on file  Other Topics Concern  . 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  Patient has college education.    Right handed.   Caffeine- not every day . Soda or tea when she is out for dinner.     Family History: The patient's family history includes Cancer in her daughter; Coronary artery disease in her brother and father; Diabetes in her brother and father; Hypertension in her brother, father, and mother. ROS:   Please see the history of present illness.    All other systems are reviewed and are negative EKGs/Labs/Other Studies Reviewed:   11/01/2017 echo - Left ventricle: The cavity size was normal. There was severe   concentric hypertrophy. Systolic function was vigorous. The   estimated ejection fraction was in the range of 65% to 70%. Wall   motion was normal; there were no regional wall motion   abnormalities. Doppler parameters are consistent with abnormal   left ventricular relaxation (grade 1 diastolic dysfunction). - Aortic valve: Trileaflet; mildly thickened leaflets. - Mitral valve: Moderately thickened, mildly calcified leaflets . - Right ventricle: Systolic function was normal. - Right atrium: The atrium was normal in size. - Tricuspid valve: There was mild regurgitation. - Pulmonary arteries: Systolic pressure was within the normal   range. - Pericardium, extracardiac: There was no pericardial effusion.  EKG:  EKG is ordered today.  It shows normal sinus rhythm, QTC 430 ms Recent Labs: 09/12/2018: ALT 13; BUN 12; Creatinine, Ser 0.72; Hemoglobin 13.9; Platelets 98; Potassium 4.2; Sodium 141  Recent Lipid Panel No results found for: CHOL, TRIG, HDL, CHOLHDL, VLDL, LDLCALC, LDLDIRECT  Physical Exam:    VS:  BP 140/73   Pulse 87   Temp (!) 97.3 F (36.3 C)   Ht 5\' 7"  (1.702 m)   Wt 199 lb 9.6 oz (90.5 kg)   SpO2 95%   BMI 31.26 kg/m     Wt Readings from Last 3 Encounters:  09/21/18 199 lb 9.6 oz (90.5 kg)  01/31/18 198 lb 12.8 oz (90.2 kg)  01/25/18 197 lb (89.4 kg)   General: Alert, oriented x3, no distress, mildly obese. Head:  no evidence of trauma, PERRL, EOMI, no exophtalmos or lid lag, no myxedema, no xanthelasma; normal ears, nose and oropharynx Neck: normal jugular venous pulsations and no hepatojugular reflux; brisk carotid pulses without delay and no carotid bruits Chest: clear to auscultation, no signs of consolidation by percussion or palpation, normal fremitus, symmetrical and full respiratory excursions Cardiovascular: normal position and quality of the apical impulse, regular rhythm, normal first and second heart sounds, no murmurs, rubs or gallops Abdomen: no tenderness or distention, no masses by palpation, no abnormal pulsatility or arterial bruits, normal bowel sounds, no hepatosplenomegaly Extremities: no clubbing, cyanosis or edema; 2+ radial, ulnar and brachial pulses bilaterally; 2+ right femoral, posterior tibial and dorsalis pedis pulses; 2+ left femoral, posterior tibial and dorsalis pedis pulses;  no subclavian or femoral bruits Neurological: grossly nonfocal Psych: Normal mood and affect   ASSESSMENT:    1. Paroxysmal atrial fibrillation (HCC)   2. Hypotension, unspecified hypotension type   3. Atrial tachycardia (HCC)    PLAN:    In order of problems listed above:  1. AFib: Minimally symptomatic.  Normal echo with normal left atrial size, which makes it less likely she will have frequent recurrent events.  She actually feels better taking the beta-blocker on an as-needed basis only, which is also beneficial (sometimes run low blood pressure.  Continue Eliquis without interruption.  No bleeding complications.  At this point there appears to be no reason to treat with true antiarrhythmics or referral for ablation. CHADSVasc 3 (age 24, gender).    Medication Adjustments/Labs and Tests Ordered: Current medicines are reviewed at length with the patient today.  Concerns regarding medicines are outlined above.  No orders of the defined types were placed in this encounter.  No orders of the  defined types were placed in this encounter.  STOP ASPIRIN   Signed, Sanda Klein, MD  09/21/2018 11:52 AM     [facility]

## 2018-09-21 NOTE — Patient Instructions (Signed)
Medication Instructions:  Your physician recommends that you continue on your current medications as directed. Please refer to the Current Medication list given to you today.  If you need a refill on your cardiac medications before your next appointment, please call your pharmacy.   Lab work: NONE If you have labs (blood work) drawn today and your tests are completely normal, you will receive your results only by: [place] (if you have MyChart) OR A paper copy in the mail If you have any lab test that is abnormal or we need to change your treatment, we will call you to review the results.  Testing/Procedures: NONE  Follow-Up: At [facility], you and your health needs are our priority.  As part of our continuing mission to provide you with exceptional heart care, we have created designated Provider Care Teams.  These Care Teams include your primary Cardiologist (physician) and Advanced Practice Providers (APPs -  Physician Assistants and Nurse Practitioners) who all work together to provide you with the care you need, when you need it. You will need a follow up appointment in 12 months.  Please call our office 2 months in advance to schedule this appointment.  You may see Sanda Klein, MD or one of the following Advanced Practice Providers on your designated Care Team: Almyra Deforest, PA-C Fabian Sharp, [place]

## 2018-10-12 ENCOUNTER — Other Ambulatory Visit: Payer: Self-pay

## 2018-10-12 MED ORDER — APIXABAN 5 MG PO TABS: ORAL_TABLET | ORAL | 1 refills | Status: DC

## 2018-10-17 DIAGNOSIS — Z23 Encounter for immunization: Secondary | ICD-10-CM | POA: Diagnosis not present

## 2018-11-21 DIAGNOSIS — I480 Paroxysmal atrial fibrillation: Secondary | ICD-10-CM | POA: Diagnosis not present

## 2018-11-21 DIAGNOSIS — E538 Deficiency of other specified B group vitamins: Secondary | ICD-10-CM | POA: Diagnosis not present

## 2018-11-21 DIAGNOSIS — Z1389 Encounter for screening for other disorder: Secondary | ICD-10-CM | POA: Diagnosis not present

## 2018-11-21 DIAGNOSIS — G609 Hereditary and idiopathic neuropathy, unspecified: Secondary | ICD-10-CM | POA: Diagnosis not present

## 2018-11-21 DIAGNOSIS — Z0000 Encounter for general adult medical examination without abnormal findings: Secondary | ICD-10-CM | POA: Diagnosis not present

## 2018-11-21 DIAGNOSIS — C9110 Chronic lymphocytic leukemia of B-cell type not having achieved remission: Secondary | ICD-10-CM | POA: Diagnosis not present

## 2018-11-21 DIAGNOSIS — E782 Mixed hyperlipidemia: Secondary | ICD-10-CM | POA: Diagnosis not present

## 2018-11-21 DIAGNOSIS — M8580 Other specified disorders of bone density and structure, unspecified site: Secondary | ICD-10-CM | POA: Diagnosis not present

## 2018-11-21 DIAGNOSIS — I471 Supraventricular tachycardia: Secondary | ICD-10-CM | POA: Diagnosis not present

## 2018-11-23 DIAGNOSIS — M19049 Primary osteoarthritis, unspecified hand: Secondary | ICD-10-CM | POA: Diagnosis not present

## 2018-11-23 DIAGNOSIS — E782 Mixed hyperlipidemia: Secondary | ICD-10-CM | POA: Diagnosis not present

## 2018-11-23 DIAGNOSIS — M8580 Other specified disorders of bone density and structure, unspecified site: Secondary | ICD-10-CM | POA: Diagnosis not present

## 2018-11-23 DIAGNOSIS — Z9181 History of falling: Secondary | ICD-10-CM | POA: Diagnosis not present

## 2018-11-23 DIAGNOSIS — G35 Multiple sclerosis: Secondary | ICD-10-CM | POA: Diagnosis not present

## 2018-11-23 DIAGNOSIS — E669 Obesity, unspecified: Secondary | ICD-10-CM | POA: Diagnosis not present

## 2018-11-23 DIAGNOSIS — Z7901 Long term (current) use of anticoagulants: Secondary | ICD-10-CM | POA: Diagnosis not present

## 2018-11-23 DIAGNOSIS — E538 Deficiency of other specified B group vitamins: Secondary | ICD-10-CM | POA: Diagnosis not present

## 2018-11-23 DIAGNOSIS — G609 Hereditary and idiopathic neuropathy, unspecified: Secondary | ICD-10-CM | POA: Diagnosis not present

## 2018-11-23 DIAGNOSIS — I480 Paroxysmal atrial fibrillation: Secondary | ICD-10-CM | POA: Diagnosis not present

## 2018-11-23 DIAGNOSIS — Z6831 Body mass index (BMI) 31.0-31.9, adult: Secondary | ICD-10-CM | POA: Diagnosis not present

## 2018-11-23 DIAGNOSIS — G510 Bell's palsy: Secondary | ICD-10-CM | POA: Diagnosis not present

## 2018-11-23 DIAGNOSIS — Z856 Personal history of leukemia: Secondary | ICD-10-CM | POA: Diagnosis not present

## 2018-11-23 DIAGNOSIS — M8560 Other cyst of bone, unspecified site: Secondary | ICD-10-CM | POA: Diagnosis not present

## 2018-11-25 DIAGNOSIS — G609 Hereditary and idiopathic neuropathy, unspecified: Secondary | ICD-10-CM | POA: Diagnosis not present

## 2018-11-25 DIAGNOSIS — M8580 Other specified disorders of bone density and structure, unspecified site: Secondary | ICD-10-CM | POA: Diagnosis not present

## 2018-11-25 DIAGNOSIS — G510 Bell's palsy: Secondary | ICD-10-CM | POA: Diagnosis not present

## 2018-11-25 DIAGNOSIS — G35 Multiple sclerosis: Secondary | ICD-10-CM | POA: Diagnosis not present

## 2018-11-25 DIAGNOSIS — I480 Paroxysmal atrial fibrillation: Secondary | ICD-10-CM | POA: Diagnosis not present

## 2018-11-25 DIAGNOSIS — M19049 Primary osteoarthritis, unspecified hand: Secondary | ICD-10-CM | POA: Diagnosis not present

## 2018-11-29 DIAGNOSIS — I480 Paroxysmal atrial fibrillation: Secondary | ICD-10-CM | POA: Diagnosis not present

## 2018-11-29 DIAGNOSIS — G35 Multiple sclerosis: Secondary | ICD-10-CM | POA: Diagnosis not present

## 2018-11-29 DIAGNOSIS — M8580 Other specified disorders of bone density and structure, unspecified site: Secondary | ICD-10-CM | POA: Diagnosis not present

## 2018-11-29 DIAGNOSIS — G609 Hereditary and idiopathic neuropathy, unspecified: Secondary | ICD-10-CM | POA: Diagnosis not present

## 2018-11-29 DIAGNOSIS — M19049 Primary osteoarthritis, unspecified hand: Secondary | ICD-10-CM | POA: Diagnosis not present

## 2018-11-29 DIAGNOSIS — G510 Bell's palsy: Secondary | ICD-10-CM | POA: Diagnosis not present

## 2018-12-01 DIAGNOSIS — M19049 Primary osteoarthritis, unspecified hand: Secondary | ICD-10-CM | POA: Diagnosis not present

## 2018-12-01 DIAGNOSIS — M8580 Other specified disorders of bone density and structure, unspecified site: Secondary | ICD-10-CM | POA: Diagnosis not present

## 2018-12-01 DIAGNOSIS — I480 Paroxysmal atrial fibrillation: Secondary | ICD-10-CM | POA: Diagnosis not present

## 2018-12-01 DIAGNOSIS — G609 Hereditary and idiopathic neuropathy, unspecified: Secondary | ICD-10-CM | POA: Diagnosis not present

## 2018-12-01 DIAGNOSIS — G35 Multiple sclerosis: Secondary | ICD-10-CM | POA: Diagnosis not present

## 2018-12-01 DIAGNOSIS — G510 Bell's palsy: Secondary | ICD-10-CM | POA: Diagnosis not present

## 2018-12-05 DIAGNOSIS — M8580 Other specified disorders of bone density and structure, unspecified site: Secondary | ICD-10-CM | POA: Diagnosis not present

## 2018-12-05 DIAGNOSIS — Z961 Presence of intraocular lens: Secondary | ICD-10-CM | POA: Diagnosis not present

## 2018-12-05 DIAGNOSIS — H524 Presbyopia: Secondary | ICD-10-CM | POA: Diagnosis not present

## 2018-12-05 DIAGNOSIS — G510 Bell's palsy: Secondary | ICD-10-CM | POA: Diagnosis not present

## 2018-12-05 DIAGNOSIS — G609 Hereditary and idiopathic neuropathy, unspecified: Secondary | ICD-10-CM | POA: Diagnosis not present

## 2018-12-05 DIAGNOSIS — I480 Paroxysmal atrial fibrillation: Secondary | ICD-10-CM | POA: Diagnosis not present

## 2018-12-05 DIAGNOSIS — G35 Multiple sclerosis: Secondary | ICD-10-CM | POA: Diagnosis not present

## 2018-12-05 DIAGNOSIS — M19049 Primary osteoarthritis, unspecified hand: Secondary | ICD-10-CM | POA: Diagnosis not present

## 2018-12-05 DIAGNOSIS — H04123 Dry eye syndrome of bilateral lacrimal glands: Secondary | ICD-10-CM | POA: Diagnosis not present

## 2018-12-07 DIAGNOSIS — G35 Multiple sclerosis: Secondary | ICD-10-CM | POA: Diagnosis not present

## 2018-12-07 DIAGNOSIS — I480 Paroxysmal atrial fibrillation: Secondary | ICD-10-CM | POA: Diagnosis not present

## 2018-12-07 DIAGNOSIS — M8580 Other specified disorders of bone density and structure, unspecified site: Secondary | ICD-10-CM | POA: Diagnosis not present

## 2018-12-07 DIAGNOSIS — G609 Hereditary and idiopathic neuropathy, unspecified: Secondary | ICD-10-CM | POA: Diagnosis not present

## 2018-12-07 DIAGNOSIS — M19049 Primary osteoarthritis, unspecified hand: Secondary | ICD-10-CM | POA: Diagnosis not present

## 2018-12-07 DIAGNOSIS — G510 Bell's palsy: Secondary | ICD-10-CM | POA: Diagnosis not present

## 2018-12-12 DIAGNOSIS — G35 Multiple sclerosis: Secondary | ICD-10-CM | POA: Diagnosis not present

## 2018-12-12 DIAGNOSIS — G609 Hereditary and idiopathic neuropathy, unspecified: Secondary | ICD-10-CM | POA: Diagnosis not present

## 2018-12-12 DIAGNOSIS — I480 Paroxysmal atrial fibrillation: Secondary | ICD-10-CM | POA: Diagnosis not present

## 2018-12-12 DIAGNOSIS — G510 Bell's palsy: Secondary | ICD-10-CM | POA: Diagnosis not present

## 2018-12-12 DIAGNOSIS — M8580 Other specified disorders of bone density and structure, unspecified site: Secondary | ICD-10-CM | POA: Diagnosis not present

## 2018-12-12 DIAGNOSIS — M19049 Primary osteoarthritis, unspecified hand: Secondary | ICD-10-CM | POA: Diagnosis not present

## 2018-12-14 DIAGNOSIS — I480 Paroxysmal atrial fibrillation: Secondary | ICD-10-CM | POA: Diagnosis not present

## 2018-12-14 DIAGNOSIS — G510 Bell's palsy: Secondary | ICD-10-CM | POA: Diagnosis not present

## 2018-12-14 DIAGNOSIS — G609 Hereditary and idiopathic neuropathy, unspecified: Secondary | ICD-10-CM | POA: Diagnosis not present

## 2018-12-14 DIAGNOSIS — M8580 Other specified disorders of bone density and structure, unspecified site: Secondary | ICD-10-CM | POA: Diagnosis not present

## 2018-12-14 DIAGNOSIS — G35 Multiple sclerosis: Secondary | ICD-10-CM | POA: Diagnosis not present

## 2018-12-14 DIAGNOSIS — M19049 Primary osteoarthritis, unspecified hand: Secondary | ICD-10-CM | POA: Diagnosis not present

## 2018-12-19 DIAGNOSIS — I480 Paroxysmal atrial fibrillation: Secondary | ICD-10-CM | POA: Diagnosis not present

## 2018-12-19 DIAGNOSIS — M19049 Primary osteoarthritis, unspecified hand: Secondary | ICD-10-CM | POA: Diagnosis not present

## 2018-12-19 DIAGNOSIS — G35 Multiple sclerosis: Secondary | ICD-10-CM | POA: Diagnosis not present

## 2018-12-19 DIAGNOSIS — M8580 Other specified disorders of bone density and structure, unspecified site: Secondary | ICD-10-CM | POA: Diagnosis not present

## 2018-12-19 DIAGNOSIS — G510 Bell's palsy: Secondary | ICD-10-CM | POA: Diagnosis not present

## 2018-12-19 DIAGNOSIS — G609 Hereditary and idiopathic neuropathy, unspecified: Secondary | ICD-10-CM | POA: Diagnosis not present

## 2018-12-21 DIAGNOSIS — I480 Paroxysmal atrial fibrillation: Secondary | ICD-10-CM | POA: Diagnosis not present

## 2018-12-21 DIAGNOSIS — G510 Bell's palsy: Secondary | ICD-10-CM | POA: Diagnosis not present

## 2018-12-21 DIAGNOSIS — G35 Multiple sclerosis: Secondary | ICD-10-CM | POA: Diagnosis not present

## 2018-12-21 DIAGNOSIS — M8580 Other specified disorders of bone density and structure, unspecified site: Secondary | ICD-10-CM | POA: Diagnosis not present

## 2018-12-21 DIAGNOSIS — G609 Hereditary and idiopathic neuropathy, unspecified: Secondary | ICD-10-CM | POA: Diagnosis not present

## 2018-12-21 DIAGNOSIS — M19049 Primary osteoarthritis, unspecified hand: Secondary | ICD-10-CM | POA: Diagnosis not present

## 2018-12-23 DIAGNOSIS — Z6831 Body mass index (BMI) 31.0-31.9, adult: Secondary | ICD-10-CM | POA: Diagnosis not present

## 2018-12-23 DIAGNOSIS — Z7901 Long term (current) use of anticoagulants: Secondary | ICD-10-CM | POA: Diagnosis not present

## 2018-12-23 DIAGNOSIS — I480 Paroxysmal atrial fibrillation: Secondary | ICD-10-CM | POA: Diagnosis not present

## 2018-12-23 DIAGNOSIS — G35 Multiple sclerosis: Secondary | ICD-10-CM | POA: Diagnosis not present

## 2018-12-23 DIAGNOSIS — Z9181 History of falling: Secondary | ICD-10-CM | POA: Diagnosis not present

## 2018-12-23 DIAGNOSIS — G510 Bell's palsy: Secondary | ICD-10-CM | POA: Diagnosis not present

## 2018-12-23 DIAGNOSIS — E782 Mixed hyperlipidemia: Secondary | ICD-10-CM | POA: Diagnosis not present

## 2018-12-23 DIAGNOSIS — M19049 Primary osteoarthritis, unspecified hand: Secondary | ICD-10-CM | POA: Diagnosis not present

## 2018-12-23 DIAGNOSIS — G609 Hereditary and idiopathic neuropathy, unspecified: Secondary | ICD-10-CM | POA: Diagnosis not present

## 2018-12-23 DIAGNOSIS — Z856 Personal history of leukemia: Secondary | ICD-10-CM | POA: Diagnosis not present

## 2018-12-23 DIAGNOSIS — E669 Obesity, unspecified: Secondary | ICD-10-CM | POA: Diagnosis not present

## 2018-12-23 DIAGNOSIS — E538 Deficiency of other specified B group vitamins: Secondary | ICD-10-CM | POA: Diagnosis not present

## 2018-12-23 DIAGNOSIS — M8580 Other specified disorders of bone density and structure, unspecified site: Secondary | ICD-10-CM | POA: Diagnosis not present

## 2018-12-26 DIAGNOSIS — G609 Hereditary and idiopathic neuropathy, unspecified: Secondary | ICD-10-CM | POA: Diagnosis not present

## 2018-12-26 DIAGNOSIS — G510 Bell's palsy: Secondary | ICD-10-CM | POA: Diagnosis not present

## 2018-12-26 DIAGNOSIS — I480 Paroxysmal atrial fibrillation: Secondary | ICD-10-CM | POA: Diagnosis not present

## 2018-12-26 DIAGNOSIS — M8580 Other specified disorders of bone density and structure, unspecified site: Secondary | ICD-10-CM | POA: Diagnosis not present

## 2018-12-26 DIAGNOSIS — M19049 Primary osteoarthritis, unspecified hand: Secondary | ICD-10-CM | POA: Diagnosis not present

## 2018-12-26 DIAGNOSIS — G35 Multiple sclerosis: Secondary | ICD-10-CM | POA: Diagnosis not present

## 2019-02-27 DIAGNOSIS — E538 Deficiency of other specified B group vitamins: Secondary | ICD-10-CM | POA: Diagnosis not present

## 2019-04-05 ENCOUNTER — Emergency Department (HOSPITAL_COMMUNITY): Payer: Medicare Other

## 2019-04-05 ENCOUNTER — Emergency Department (HOSPITAL_COMMUNITY)
Admission: EM | Admit: 2019-04-05 | Discharge: 2019-04-05 | Disposition: A | Discharge status: Home / Self Care | Payer: Medicare Other | Attending: Emergency Medicine | Admitting: Emergency Medicine

## 2019-04-05 ENCOUNTER — Encounter (HOSPITAL_COMMUNITY): Payer: Self-pay

## 2019-04-05 DIAGNOSIS — Y999 Unspecified external cause status: Secondary | ICD-10-CM | POA: Diagnosis not present

## 2019-04-05 DIAGNOSIS — W5909XA Other contact with nonvenomous lizards, initial encounter: Secondary | ICD-10-CM | POA: Diagnosis not present

## 2019-04-05 DIAGNOSIS — Y9389 Activity, other specified: Secondary | ICD-10-CM | POA: Insufficient documentation

## 2019-04-05 DIAGNOSIS — S93402A Sprain of unspecified ligament of left ankle, initial encounter: Secondary | ICD-10-CM

## 2019-04-05 DIAGNOSIS — W19XXXA Unspecified fall, initial encounter: Secondary | ICD-10-CM

## 2019-04-05 DIAGNOSIS — S92351A Displaced fracture of fifth metatarsal bone, right foot, initial encounter for closed fracture: Secondary | ICD-10-CM | POA: Insufficient documentation

## 2019-04-05 DIAGNOSIS — R52 Pain, unspecified: Secondary | ICD-10-CM | POA: Diagnosis not present

## 2019-04-05 DIAGNOSIS — Y92009 Unspecified place in unspecified non-institutional (private) residence as the place of occurrence of the external cause: Secondary | ICD-10-CM | POA: Insufficient documentation

## 2019-04-05 DIAGNOSIS — S99911A Unspecified injury of right ankle, initial encounter: Secondary | ICD-10-CM | POA: Diagnosis present

## 2019-04-05 DIAGNOSIS — Z79899 Other long term (current) drug therapy: Secondary | ICD-10-CM | POA: Insufficient documentation

## 2019-04-05 DIAGNOSIS — Z91040 Latex allergy status: Secondary | ICD-10-CM | POA: Diagnosis not present

## 2019-04-05 DIAGNOSIS — W010XXA Fall on same level from slipping, tripping and stumbling without subsequent striking against object, initial encounter: Secondary | ICD-10-CM | POA: Diagnosis not present

## 2019-04-05 DIAGNOSIS — S93401A Sprain of unspecified ligament of right ankle, initial encounter: Secondary | ICD-10-CM | POA: Diagnosis not present

## 2019-04-05 DIAGNOSIS — S299XXA Unspecified injury of thorax, initial encounter: Secondary | ICD-10-CM | POA: Diagnosis not present

## 2019-04-05 DIAGNOSIS — S92354A Nondisplaced fracture of fifth metatarsal bone, right foot, initial encounter for closed fracture: Secondary | ICD-10-CM | POA: Diagnosis not present

## 2019-04-05 DIAGNOSIS — M25571 Pain in right ankle and joints of right foot: Secondary | ICD-10-CM | POA: Diagnosis not present

## 2019-04-05 DIAGNOSIS — S99912A Unspecified injury of left ankle, initial encounter: Secondary | ICD-10-CM | POA: Diagnosis not present

## 2019-04-05 HISTORY — DX: Hereditary motor and sensory neuropathy: G60.0

## 2019-04-05 NOTE — ED Triage Notes (Signed)
Pt was trying to chase a lizard out of the house and tripped on furniture, pt complains of bilateral ankle pain with swelling

## 2019-04-05 NOTE — ED Provider Notes (Signed)
[facility] DEPT Provider Note: Georgena Spurling, MD, FACEP  CSN: [id number] MRN: [id number] ARRIVAL: 04/05/19 at [facility]  Ankle Injury   HISTORY OF PRESENT ILLNESS  04/05/19 2:50 AM Tasha [name] is a 79 y.o. female who was trying to chase a lizard out of her house this morning and tripped on furniture.  She injured both ankles.  She has pain in both ankles with associated swelling.  She rates the pain is a 10 out of 10, worse with palpation or attempted movement.  The right is worse than the left.  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   Past Medical History:  Diagnosis Date  . Atrial fibrillation ([place])    questionable, echo 03/23/11 - EF >55%, on warfarin  . Bell's palsy   . 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 Charcot-Marie disease   . 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 Gait disturbance   . Gallstones   . 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 IFG (impaired fasting glucose)    e  . Personal history of other diseases of circulatory system   . Tick bite   . Unspecified hereditary and idiopathic peripheral neuropathy   . Vitamin B12 deficiency   . Vitamin D deficiency     Past Surgical History:  Procedure Laterality Date  . ABDOMINAL HYSTERECTOMY  1984   fibroids  . BREAST SURGERY  1960s   benign breast tumors  . CATARACT EXTRACTION W/ INTRAOCULAR LENS  IMPLANT, BILATERAL    . LAPAROSCOPIC BILATERAL SALPINGO OOPHERECTOMY  2003   ovarian cyst    Family History  Problem Relation Age of Onset  . Hypertension Mother   . Coronary artery disease Father        died at 48  . Hypertension Father   . Diabetes Father   . Coronary artery disease Brother        died at 8  . Diabetes Brother   . Hypertension Brother   . Cancer Daughter     Social History   Tobacco Use  . Smoking status: Never Smoker    . Smokeless tobacco: Never Used  Substance Use Topics  . Alcohol use: No    Alcohol/week: 0.0 standard drinks  . Drug use: No    Prior to Admission medications   Medication Sig Start Date End Date Taking? Authorizing Provider  apixaban (ELIQUIS) 5 MG TABS tablet TAKE 1 TABLET(5 MG) BY MOUTH TWICE DAILY 10/12/18   Croitoru, Mihai, MD  cholecalciferol (VITAMIN D) 1000 units tablet Take 1,000 Units by mouth daily.    [provider]  Cyanocobalamin (VITAMIN B 12 PO) Take 100 mg by mouth daily.     [provider]  metoprolol tartrate (LOPRESSOR) 25 MG tablet TAKE 1/2 TABLET BY MOUTH TWICE DAILY, MAY TAKE AN ADDITIONAL 1/2 TABLET EVERY DAY AS NEEDED FOR PALPITATIONS Patient not taking: Reported on 09/21/2018 05/24/17   Croitoru, Mihai, MD  montelukast (SINGULAIR) 10 MG tablet Take 10 mg by mouth daily as needed (AS DIRECTED BY PHYSICIAN).  03/23/16 09/21/18  [provider]    Allergies Clindamycin, Celebrex [celecoxib], Statins, Tape, Tricor [fenofibrate], Amoxicillin, Clarithromycin, and Latex   REVIEW OF SYSTEMS  Negative except as noted here or in the History of Present Illness.   PHYSICAL EXAMINATION  Initial Vital Signs Blood pressure 129/76, pulse 87, temperature 98.2 F (36.8 C), temperature source Oral, resp. rate  17, height 5\' 7"  (1.702 m), weight 90.3 kg, SpO2 100 %.  Examination General: Well-developed, well-nourished female in no acute distress; appearance consistent with age of record HENT: normocephalic; atraumatic Eyes: pupils equal, round and reactive to light; extraocular muscles intact Neck: supple Heart: regular rate and rhythm Lungs: clear to auscultation bilaterally Abdomen: soft; nondistended; nontender; no masses or hepatosplenomegaly; bowel sounds present Extremities: Swelling and tenderness of bilateral ankles, swelling and tenderness overlying the base of the right fifth metatarsal; pulses normal Neurologic: Awake, alert and oriented;  motor function intact in all extremities; no facial droop Skin: Warm and dry Psychiatric: Normal mood and affect   RESULTS  Summary of this visit's results, reviewed and interpreted by myself:   EKG Interpretation  Date/Time:    Ventricular Rate:    PR Interval:    QRS Duration:   QT Interval:    QTC Calculation:   R Axis:     Text Interpretation:        Laboratory Studies: No results found for this or any previous visit (from the past 24 hour(s)). Imaging Studies: DG Chest 1 View  Result Date: 04/05/2019 CLINICAL DATA:  Preoperative, fell at home EXAM: CHEST  1 VIEW COMPARISON:  Radiograph 04/15/2016 FINDINGS: Low lung volumes with some streaky opacities favoring atelectasis. No consolidation, features of edema, pneumothorax, or effusion. Double density along the right heart border likely reflects mild left atrial enlargement. The aorta is calcified. The remaining cardiomediastinal contours are unremarkable. No acute osseous or soft tissue abnormality. IMPRESSION: Low lung volumes with some streaky opacities favoring atelectasis. Likely left atrial enlargement. Aortic Atherosclerosis (ICD10-I70.0). Electronically Signed   By: Lovena Le M.D.   On: 04/05/2019 03:28   DG Ankle Complete Left  Result Date: 04/05/2019 CLINICAL DATA:  79 year old female with fall and left ankle pain. EXAM: LEFT ANKLE COMPLETE - 3+ VIEW COMPARISON:  Right ankle radiograph dated 04/05/2019. FINDINGS: There is no acute fracture or dislocation. The bones are osteopenic. The ankle mortise is intact. The soft tissues are unremarkable. IMPRESSION: Negative. Electronically Signed   By: Anner Crete M.D.   On: 04/05/2019 03:24   DG Ankle Complete Right  Result Date: 04/05/2019 CLINICAL DATA:  79 year old female with fall and right ankle pain. EXAM: RIGHT ANKLE - COMPLETE 3+ VIEW; RIGHT FOOT COMPLETE - 3+ VIEW COMPARISON:  Left ankle radiograph dated 04/05/2019. FINDINGS: There is a nondisplaced avulsion  fracture of the base of the fifth metatarsal. No other acute fracture noted. The bones are osteopenic. There is no dislocation. The ankle mortise is intact. The soft tissues are unremarkable. IMPRESSION: Nondisplaced avulsion fracture of the base of the fifth metatarsal. Electronically Signed   By: Anner Crete M.D.   On: 04/05/2019 03:27   DG Foot Complete Right  Result Date: 04/05/2019 CLINICAL DATA:  79 year old female with fall and right ankle pain. EXAM: RIGHT ANKLE - COMPLETE 3+ VIEW; RIGHT FOOT COMPLETE - 3+ VIEW COMPARISON:  Left ankle radiograph dated 04/05/2019. FINDINGS: There is a nondisplaced avulsion fracture of the base of the fifth metatarsal. No other acute fracture noted. The bones are osteopenic. There is no dislocation. The ankle mortise is intact. The soft tissues are unremarkable. IMPRESSION: Nondisplaced avulsion fracture of the base of the fifth metatarsal. Electronically Signed   By: Anner Crete M.D.   On: 04/05/2019 03:27    ED COURSE and MDM  Nursing notes, initial and subsequent vitals signs, including pulse oximetry, reviewed and interpreted by myself.  Vitals:   04/05/19 0241  04/05/19 0242 04/05/19 0330 04/05/19 0400  BP: 129/76  136/68 127/66  Pulse: 87  85 86  Resp: 17  18 17   Temp: 98.2 F (36.8 C)     TempSrc: Oral     SpO2: 100%  92% 96%  Weight:  90.3 kg    Height:  5\' 7"  (1.702 m)     Medications - No data to display  We will place patient in the left ASO for ankle sprain and right cam walker for possible ankle sprain and pseudo-Jones fracture.  She already has a walker at home.  She does not wish any analgesics.  PROCEDURES  Procedures   ED DIAGNOSES     ICD-10-CM   1. Fall in home, initial encounter  W19.XXXA    Y92.009   2. Other contact with nonvenomous lizards, initial encounter  W59.09XA   3. Sprain of left ankle, unspecified ligament, initial encounter  S93.402A   4. Closed fracture of base of fifth metatarsal bone of right  foot, initial encounter  S92.351A        Bricelyn Freestone, MD 04/05/19 0405

## 2019-04-05 NOTE — ED Notes (Signed)
Pt given ice for both ankles.

## 2019-04-10 ENCOUNTER — Other Ambulatory Visit: Payer: Self-pay

## 2019-04-10 MED ORDER — APIXABAN 5 MG PO TABS: ORAL_TABLET | ORAL | 1 refills | Status: DC

## 2019-04-18 DIAGNOSIS — S92354A Nondisplaced fracture of fifth metatarsal bone, right foot, initial encounter for closed fracture: Secondary | ICD-10-CM | POA: Diagnosis not present

## 2019-04-18 DIAGNOSIS — M79672 Pain in left foot: Secondary | ICD-10-CM | POA: Diagnosis not present

## 2019-05-02 DIAGNOSIS — S92354A Nondisplaced fracture of fifth metatarsal bone, right foot, initial encounter for closed fracture: Secondary | ICD-10-CM | POA: Diagnosis not present

## 2019-05-23 DIAGNOSIS — S92354A Nondisplaced fracture of fifth metatarsal bone, right foot, initial encounter for closed fracture: Secondary | ICD-10-CM | POA: Diagnosis not present

## 2019-06-26 ENCOUNTER — Telehealth: Payer: Self-pay | Admitting: Cardiovascular Disease

## 2019-06-26 NOTE — Telephone Encounter (Signed)
Received call from patient stating that she took her Eliquis twice already this morning-  I did ask PharmD and they suggested that she resume taking it tomorrow morning. Patient verbalized understanding, will call back with any symptoms of anything.

## 2019-06-26 NOTE — Telephone Encounter (Signed)
Pt c/o medication issue:  1. Name of Medication: apixaban (ELIQUIS) 5 MG TABS tablet  2. How are you currently taking this medication (dosage and times per day)? Twice a day, every 12 hours  3. Are you having a reaction (difficulty breathing--STAT)? no  4. What is your medication issue? Patient states she took her eliquis this morning at 7:55am and then took it again by mistake at 9:45am.

## 2019-09-11 NOTE — Progress Notes (Signed)
[facility]  [phone] [phone]     Tasha [name] DOB: [birth year]  MR#: [id number]  MBW#:[id number]  Patient Care Team: Tasha Orn, MD as PCP - General (Internal Medicine) Tasha Klein, MD as Consulting Physician (Cardiology) Tasha [name], Tasha Dad, MD as Consulting Physician (Oncology) Tasha Sous, MD as Referring Physician (Internal Medicine) OTHER MD:  Tasha [name]: Chronic Lymphoid Leukemia  CURRENT TREATMENT: Observation   INTERVAL HISTORY: Tasha [name] returns today for follow up of her chronic lymphoid leukemia. She continues under observation.   Since her last visit, she experienced a fall on 04/05/2019. She presented to the ED and was found to have a left ankle sprain and a right fifth toe fracture.  She has a right foot drop which contributed.   REVIEW OF SYSTEMS: Tasha [name] has been under a great deal of stress because of her husband's problem which included A. fib, severe mitral valve prolapse, a renal artery aneurysm with bleeding, and severe anxiety.  Things are slightly better now but he still requires quite a bit of help from her.  Her 85 year old grandson is going to school seventh grade and already doing high school math.  The patient has not had any fevers, drenching sweats, unexplained weight loss or unexplained fatigue.  She is not aware of any adenopathy.  She has not had any rash, pruritus or bleeding problems.  A detailed review of systems today was otherwise stable   HISTORY OF CURRENT ILLNESS: Tasha [name] has been following up with Dr. Sabas [name] at [facility]. From Dr. Tawanna Sat 03/23/2017 note:  Tasha [name] is a 79 y.o. [place] woman with chronic lymphocytic leukemia diagnosed in 2001, Rai stage 0. She has been followed at [facility] by Dr. Sabas [name] at [facility], who is retiring (the end of an era).  Dr. Laurance Flatten was seeing [name] on a once a year basis.  She has not required treatment to date.  Most recent labs obtained  at [facility] 03/22/2017, showed a hemoglobin of 13.8, MCV 88, white cell count 6.1, platelets 101,000, unchanged since at least August 2007); with IgA 51, IgG 421, and IgM 11.  (The IgG has dropped from 642 August 2010 to 421 March 2019).  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  Beta-2 microglobulin obtained 11/30/2000 was not elevated at 1.9.  Sed rate on the same date was 4  The patient's subsequent history is as detailed below.   PAST MEDICAL HISTORY: Past Medical History:  Diagnosis Date  . Atrial fibrillation ([place])    questionable, echo 03/23/11 - EF >55%, on warfarin  . Bell's palsy   . 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 Charcot-Marie disease   . 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 Gait disturbance   . Gallstones   . 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 IFG (impaired fasting glucose)    e  . Personal history of other diseases of circulatory system   . Tick bite   . Unspecified hereditary and idiopathic peripheral neuropathy   . Vitamin B12 deficiency   . Vitamin D deficiency   Charcoid-Marie's tooth Disease   PAST SURGICAL HISTORY: Past Surgical History:  Procedure Laterality Date  . ABDOMINAL HYSTERECTOMY  1984   fibroids  .  BREAST SURGERY  1960s   benign breast tumors  . CATARACT EXTRACTION W/ INTRAOCULAR LENS  IMPLANT, BILATERAL    . LAPAROSCOPIC BILATERAL SALPINGO OOPHERECTOMY  2003   ovarian cyst  Tonsillectomy as a child.    FAMILY HISTORY Family History  Problem Relation Age of Onset  . Hypertension [name]   . Coronary artery disease Father        died at 22  . Hypertension Father   . Diabetes Father   . Coronary artery disease Brother        died at  66  . Diabetes Brother   . Hypertension Brother   . Cancer Daughter   The patient's [name] died at age 55 due to stroke and neurological problems similar to the patient's. The patient's father died at age 17 due to heart problems and diabetes. The patient had 1 brother who died with heart problems. The patient has 2 sisters, one of whom has diabetes. There was a maternal grandfather with prostate cancer.  The patient denies any other cancers such as lymphoma or myeloma in the family.    GYNECOLOGIC HISTORY:  No LMP recorded. Patient has had a hysterectomy. Menarche: 79 years old Age at first live birth: 79 years old She is GXP3.  LMP: That is post hysterectomy in 1984 fibroids. She used HRT for less than 2 years with no complications.    SOCIAL HISTORY: (Updated August 2021) Tasha [name] retired in 2001 due to her CLL. She worked at [employer] in [employer]. She and her husband, Tasha [name] have been married for 22 years as of August 2020. Tasha [name] used to work for [employer], and also has been a Psychologist, occupational at [employer]. The patient's oldest daughter, Tasha [name], worked at [employer] in the billing department but her job was terminated 2020 and she is now working for [employer]. The patient's second daughter is Tasha [name], who is a principal at [employer]. The patient's son, Tasha [name], is an estimator for home remodeling... At home, is the patient, her husband, and her grandson, 31 y/o Tasha [name] (Tasha [name]'s son), who is doing very well academically and has a strong interest in math. Tasha [name] is "not in his life". The patient attends [employer].      HEALTH MAINTENANCE: Social History   Tobacco Use  . Smoking status: Never Smoker  . Smokeless tobacco: Never Used  Substance Use Topics  . Alcohol use: No    Alcohol/week: 0.0 standard drinks  . Drug use: No     Colonoscopy: refuses   PAP: s/p hysterectomy  Bone density: 2018 was normal  Mammography: "no  more" after 2017 per her PCP   Allergies  Allergen Reactions  . Clindamycin Itching and Rash  . Celebrex [Celecoxib] Other (See Comments)    Hypertension, possible TIA  . Statins Other (See Comments)    Muscle aches  . Tape     Patient CAN tolerate Coban Wrap only (NO TAPE!!)  . Tricor [Fenofibrate] Other (See Comments)    Elevated liver enzymes  . Amoxicillin Rash  . Clarithromycin Rash  . Latex Rash    Current Outpatient Medications  Medication Sig Dispense Refill  . apixaban (ELIQUIS) 5 MG TABS tablet TAKE 1 TABLET(5 MG) BY MOUTH TWICE DAILY 180 tablet 1  . cholecalciferol (VITAMIN D) 1000 units tablet Take 1,000 Units by mouth daily.    . Cyanocobalamin (VITAMIN B 12 PO) Take 100 mg by mouth daily.     . montelukast (SINGULAIR) 10  MG tablet Take 10 mg by mouth daily as needed (AS DIRECTED BY PHYSICIAN).      No current facility-administered medications for this visit.    OBJECTIVE: White woman using a Rollator; she was unable to climb to our fairly high examination table today Vitals:   09/12/19 0907  BP: (!) 108/50  Pulse: 77  Resp: 18  Temp: 97.6 F (36.4 C)  SpO2: 98%     Body mass index is 32.83 kg/m.   Wt Readings from Last 3 Encounters:  09/12/19 209 lb 9.6 oz (95.1 kg)  04/05/19 199 lb (90.3 kg)  09/21/18 199 lb 9.6 oz (90.5 kg)  ECOG FS:1 - Symptomatic but completely ambulatory  Sclerae unicteric, EOMs intact Wearing a mask No cervical or supraclavicular adenopathy, no axillary or inguinal adenopathy Lungs no rales or rhonchi Heart regular rate and rhythm Abd soft, nontender, positive bowel sounds no masses palpated MSK no focal spinal tenderness Neuro: Right foot drop, otherwise nonfocal, well oriented, positive affect Breasts: Deferred   LAB RESULTS:  CMP     Component Value Date/Time   NA 141 09/12/2018 1037   K 4.2 09/12/2018 1037   CL 109 09/12/2018 1037   CO2 24 09/12/2018 1037   GLUCOSE 104 (H) 09/12/2018 1037   BUN 12 09/12/2018  1037   CREATININE 0.72 09/12/2018 1037   CREATININE 0.74 01/31/2018 1358   CALCIUM 9.3 09/12/2018 1037   PROT 6.3 (L) 09/12/2018 1037   ALBUMIN 4.2 09/12/2018 1037   AST 14 (L) 09/12/2018 1037   AST 11 (L) 01/31/2018 1358   ALT 13 09/12/2018 1037   ALT 13 01/31/2018 1358   ALKPHOS 73 09/12/2018 1037   BILITOT 0.7 09/12/2018 1037   BILITOT 0.6 01/31/2018 1358   GFRNONAA >60 09/12/2018 1037   GFRNONAA >60 01/31/2018 1358   GFRAA >60 09/12/2018 1037   GFRAA >60 01/31/2018 1358    No results found for: TOTALPROTELP, ALBUMINELP, A1GS, A2GS, BETS, BETA2SER, GAMS, MSPIKE, SPEI  No results found for: KPAFRELGTCHN, LAMBDASER, KAPLAMBRATIO  Lab Results  Component Value Date   WBC 4.5 09/12/2019   NEUTROABS 2.0 09/12/2019   HGB 13.4 09/12/2019   HCT 40.2 09/12/2019   MCV 85.2 09/12/2019   PLT 97 (L) 09/12/2019   No results found for: LABCA2  No components found for: ENIDPO242  No results for input(s): INR in the last 168 hours.  No results found for: LABCA2  No results found for: PNT614  No results found for: ERX540  No results found for: GQQ761  No results found for: CA2729  No components found for: HGQUANT  No results found for: CEA1 / No results found for: CEA1   No results found for: AFPTUMOR  No results found for: CHROMOGRNA  No results found for: HGBA, HGBA2QUANT, HGBFQUANT, HGBSQUAN (Hemoglobinopathy evaluation)   Lab Results  Component Value Date   LDH 256 (H) 09/12/2018    No results found for: IRON, TIBC, IRONPCTSAT (Iron and TIBC)  No results found for: FERRITIN  Urinalysis    Component Value Date/Time   COLORURINE YELLOW 05/13/2009 [place] 05/13/2009 0821   LABSPEC 1.009 05/13/2009 0821   PHURINE 7.0 05/13/2009 0821   GLUCOSEU NEGATIVE 05/13/2009 0821   HGBUR NEGATIVE 05/13/2009 [place] 05/13/2009 [place] 05/13/2009 0821   PROTEINUR NEGATIVE 05/13/2009 0821   UROBILINOGEN 0.2  05/13/2009 0821   NITRITE NEGATIVE 05/13/2009 0821   LEUKOCYTESUR TRACE (A) 05/13/2009 9509  STUDIES: No results found.   ELIGIBLE FOR AVAILABLE RESEARCH PROTOCOL: no  ASSESSMENT: 79 y.o. 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  She has never required treatment.  We are following her on a yearly basis indefinitely.  She had her husband have received the Pfizer vaccine and I have recommended they also receive a booster shot.  She is currently very busy taking care of her husband who has had significant medical issues this year, as well as her own medical problems.  She is very motivated to maintain her health largely because of her 22 year old grandson whom she wants to see graduate from college.  She knows to call for any other issue that may develop before the next visit  Total encounter time 20 minutes.Sarajane Jews C. Riggin Cuttino, MD  09/12/19 9:32 AM Medical Oncology and Hematology [facility] [place], [place] [address] Tel. [phone]    Fax. [phone]   I, Wilburn Mylar, am acting as scribe for Dr. Virgie [name]. Garnie Borchardt.  I, Lurline Del MD, have reviewed the above documentation for accuracy and completeness, and I agree with the above.   *Total Encounter Time as defined by the Centers for Medicare and Medicaid Services includes, in addition to the face-to-face time of a patient visit (documented in the note above) non-face-to-face time: obtaining and reviewing outside history, ordering and  reviewing medications, tests or procedures, care coordination (communications with other health care professionals or caregivers) and documentation in the medical record.

## 2019-09-12 ENCOUNTER — Inpatient Hospital Stay: Payer: Medicare Other

## 2019-09-12 ENCOUNTER — Inpatient Hospital Stay: Payer: Medicare Other | Attending: Oncology | Admitting: Oncology

## 2019-09-12 ENCOUNTER — Other Ambulatory Visit: Payer: Self-pay

## 2019-09-12 VITALS — BP 108/50 | HR 77 | Temp 97.6°F | Resp 18 | Ht 67.0 in | Wt 209.6 lb

## 2019-09-12 DIAGNOSIS — D696 Thrombocytopenia, unspecified: Secondary | ICD-10-CM | POA: Diagnosis not present

## 2019-09-12 DIAGNOSIS — Z90710 Acquired absence of both cervix and uterus: Secondary | ICD-10-CM | POA: Insufficient documentation

## 2019-09-12 DIAGNOSIS — C9110 Chronic lymphocytic leukemia of B-cell type not having achieved remission: Secondary | ICD-10-CM

## 2019-09-12 DIAGNOSIS — Z809 Family history of malignant neoplasm, unspecified: Secondary | ICD-10-CM | POA: Insufficient documentation

## 2019-09-12 LAB — CBC WITH DIFFERENTIAL/PLATELET
Abs Immature Granulocytes: 0.02 10*3/uL (ref 0.00–0.07)
Basophils Absolute: 0 10*3/uL (ref 0.0–0.1)
Basophils Relative: 0 %
Eosinophils Absolute: 0.2 10*3/uL (ref 0.0–0.5)
Eosinophils Relative: 4 %
HCT: 40.2 % (ref 36.0–46.0)
Hemoglobin: 13.4 g/dL (ref 12.0–15.0)
Immature Granulocytes: 0 %
Lymphocytes Relative: 38 %
Lymphs Abs: 1.7 10*3/uL (ref 0.7–4.0)
MCH: 28.4 pg (ref 26.0–34.0)
MCHC: 33.3 g/dL (ref 30.0–36.0)
MCV: 85.2 fL (ref 80.0–100.0)
Monocytes Absolute: 0.5 10*3/uL (ref 0.1–1.0)
Monocytes Relative: 12 %
Neutro Abs: 2 10*3/uL (ref 1.7–7.7)
Neutrophils Relative %: 46 %
Platelets: 97 10*3/uL — ABNORMAL LOW (ref 150–400)
RBC: 4.72 MIL/uL (ref 3.87–5.11)
RDW: 14.6 % (ref 11.5–15.5)
WBC: 4.5 10*3/uL (ref 4.0–10.5)
nRBC: 0 % (ref 0.0–0.2)

## 2019-09-12 LAB — SAVE SMEAR(SSMR), FOR PROVIDER SLIDE REVIEW

## 2019-09-12 LAB — COMPREHENSIVE METABOLIC PANEL
ALT: 13 U/L (ref 0–44)
AST: 11 U/L — ABNORMAL LOW (ref 15–41)
Albumin: 3.8 g/dL (ref 3.5–5.0)
Alkaline Phosphatase: 74 U/L (ref 38–126)
Anion gap: 8 (ref 5–15)
BUN: 18 mg/dL (ref 8–23)
CO2: 25 mmol/L (ref 22–32)
Calcium: 9.8 mg/dL (ref 8.9–10.3)
Chloride: 109 mmol/L (ref 98–111)
Creatinine, Ser: 0.77 mg/dL (ref 0.44–1.00)
GFR calc Af Amer: >60 mL/min (ref >60)
GFR calc non Af Amer: >60 mL/min (ref >60)
Glucose, Bld: 138 mg/dL — ABNORMAL HIGH (ref 70–99)
Potassium: 4.2 mmol/L (ref 3.5–5.1)
Sodium: 142 mmol/L (ref 135–145)
Total Bilirubin: 0.8 mg/dL (ref 0.3–1.2)
Total Protein: 5.9 g/dL — ABNORMAL LOW (ref 6.5–8.1)

## 2019-09-12 LAB — LACTATE DEHYDROGENASE: LDH: 252 U/L — ABNORMAL HIGH (ref 98–192)

## 2019-09-13 ENCOUNTER — Encounter: Payer: Self-pay | Admitting: Oncology

## 2019-09-13 LAB — BETA 2 MICROGLOBULIN, SERUM: Beta-2 Microglobulin: 2.3 mg/L (ref 0.6–2.4)

## 2019-09-13 LAB — IGG, IGA, IGM
IgA: 43 mg/dL — ABNORMAL LOW (ref 64–422)
IgG (Immunoglobin G), Serum: 379 mg/dL — ABNORMAL LOW (ref 586–1602)
IgM (Immunoglobulin M), Srm: 19 mg/dL — ABNORMAL LOW (ref 26–217)

## 2019-09-13 NOTE — Progress Notes (Signed)
[place]  [phone] [phone]) [phone]     ID: Tasha [name] DOB: [birth year]  MR#: [id number]  YFR#:[id number]  Patient Care Team: Lavone Orn, MD as PCP - General (Internal Medicine) Sanda Klein, MD as Consulting Physician (Cardiology) Savreen Gebhardt, Virgie Dad, MD as Consulting Physician (Oncology) Sabas Sous, MD as Referring Physician (Internal Medicine) OTHER MD:  CHIEF COMPLAINT: Chronic Lymphoid Leukemia  CURRENT TREATMENT: Observation   INTERVAL HISTORY: Tasha [name] returns today for follow up of her chronic lymphoid leukemia. She continues under observation.   Since her last visit, she experienced a fall on 04/05/2019. She presented to the ED and was found to have a left ankle sprain and a right fifth toe fracture.  She has a right foot drop which contributed.   REVIEW OF SYSTEMS: [name] has been under a great deal of stress because of her husband's problem which included A. fib, severe mitral valve prolapse, a renal artery aneurysm with bleeding, and severe anxiety.  Things are slightly better now but he still requires quite a bit of help from her.  Her 58 year old grandson is going to school seventh grade and already doing high school math.  The patient has not had any fevers, drenching sweats, unexplained weight loss or unexplained fatigue.  She is not aware of any adenopathy.  She has not had any rash, pruritus or bleeding problems.  A detailed review of systems today was otherwise stable   HISTORY OF CURRENT ILLNESS: GERRE RANUM has been following up with Dr. Sabas Sous at [facility]. From Dr. Tawanna Sat 03/23/2017 note:  Tasha [name] is a 79 y.o. [place] woman with chronic lymphocytic leukemia diagnosed in 2001, Rai stage 0. She has been followed at [facility] by Dr. Sabas Sous at [facility], who is retiring (the end of an era).  Dr. Laurance Flatten was seeing Tasha [name] on a once a year basis.  She has not required treatment to date.  Most recent labs obtained  at [facility] 03/22/2017, showed a hemoglobin of 13.8, MCV 88, white cell count 6.1, platelets 101,000, unchanged since at least August 2007); with IgA 51, IgG 421, and IgM 11.  (The IgG has dropped from 642 August 2010 to 421 March 2019).  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  Beta-2 microglobulin obtained 11/30/2000 was not elevated at 1.9.  Sed rate on the same date was 4  The patient's subsequent history is as detailed below.   PAST MEDICAL HISTORY: Past Medical History:  Diagnosis Date  . Atrial fibrillation ([place])    questionable, echo 03/23/11 - EF >55%, on warfarin  . Bell's palsy   . 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 Charcot-Marie disease   . 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 Gait disturbance   . Gallstones   . 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 IFG (impaired fasting glucose)    e  . Personal history of other diseases of circulatory system   . Tick bite   . Unspecified hereditary and idiopathic peripheral neuropathy   . Vitamin B12 deficiency   . Vitamin D deficiency   Charcoid-Marie's tooth Disease   PAST SURGICAL HISTORY: Past Surgical History:  Procedure Laterality Date  . ABDOMINAL HYSTERECTOMY  1984   fibroids  .  BREAST SURGERY  1960s   benign breast tumors  . CATARACT EXTRACTION W/ INTRAOCULAR LENS  IMPLANT, BILATERAL    . LAPAROSCOPIC BILATERAL SALPINGO OOPHERECTOMY  2003   ovarian cyst  Tonsillectomy as a child.    FAMILY HISTORY Family History  Problem Relation Age of Onset  . Hypertension [name]   . Coronary artery disease Father        died at 22  . Hypertension Father   . Diabetes Father   . Coronary artery disease Brother        died at  66  . Diabetes Brother   . Hypertension Brother   . Cancer Daughter   The patient's [name] died at age 55 due to stroke and neurological problems similar to the patient's. The patient's father died at age 17 due to heart problems and diabetes. The patient had 1 brother who died with heart problems. The patient has 2 sisters, one of whom has diabetes. There was a maternal grandfather with prostate cancer.  The patient denies any other cancers such as lymphoma or myeloma in the family.    GYNECOLOGIC HISTORY:  No LMP recorded. Patient has had a hysterectomy. Menarche: 79 years old Age at first live birth: 79 years old She is GXP3.  LMP: That is post hysterectomy in 1984 fibroids. She used HRT for less than 2 years with no complications.    SOCIAL HISTORY: (Updated August 2021) Tasha [name] retired in 2001 due to her CLL. She worked at [employer] in [employer]. She and her husband, [name] have been married for 22 years as of August 2020. [name] used to work for [employer], and also has been a Psychologist, occupational at [employer]. The patient's oldest daughter, Tasha [name], worked at [employer] in the billing department but her job was terminated 2020 and she is now working for [employer]. The patient's second daughter is Tasha [name], who is a principal at [employer]. 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 At home, is the patient, her husband, and her grandson, 31 y/o Haze Boyden (Tasha [name]), who is doing very well academically and has a strong interest in math. Tasha [name] is "not in his life". The patient attends [facility] [employer] [facility].      HEALTH MAINTENANCE: Social History   Tobacco Use  . Smoking status: Never Smoker  . Smokeless tobacco: Never Used  Substance Use Topics  . Alcohol use: No    Alcohol/week: 0.0 standard drinks  . Drug use: No     Colonoscopy: refuses   PAP: s/p hysterectomy  Bone density: 2018 was normal  Mammography: "no  more" after 2017 per her PCP   Allergies  Allergen Reactions  . Clindamycin Itching and Rash  . Celebrex [Celecoxib] Other (See Comments)    Hypertension, possible TIA  . Statins Other (See Comments)    Muscle aches  . Tape     Patient CAN tolerate Coban Wrap only (NO TAPE!!)  . Tricor [Fenofibrate] Other (See Comments)    Elevated liver enzymes  . Amoxicillin Rash  . Clarithromycin Rash  . Latex Rash    Current Outpatient Medications  Medication Sig Dispense Refill  . apixaban (ELIQUIS) 5 MG TABS tablet TAKE 1 TABLET(5 MG) BY MOUTH TWICE DAILY 180 tablet 1  . cholecalciferol (VITAMIN D) 1000 units tablet Take 1,000 Units by mouth daily.    . Cyanocobalamin (VITAMIN B 12 PO) Take 100 mg by mouth daily.     . montelukast (SINGULAIR) 10  MG tablet Take 10 mg by mouth daily as needed (AS DIRECTED BY PHYSICIAN).      No current facility-administered medications for this visit.    OBJECTIVE: White woman using a Rollator; she was unable to climb to our fairly high examination table today There were no vitals filed for this visit.   There is no height or weight on file to calculate BMI.   Wt Readings from Last 3 Encounters:  09/12/19 209 lb 9.6 oz (95.1 kg)  04/05/19 199 lb (90.3 kg)  09/21/18 199 lb 9.6 oz (90.5 kg)  ECOG FS:1 - Symptomatic but completely ambulatory  Sclerae unicteric, EOMs intact Wearing a mask No cervical or supraclavicular adenopathy, no axillary or inguinal adenopathy Lungs no rales or rhonchi Heart regular rate and rhythm Abd soft, nontender, positive bowel sounds no masses palpated MSK no focal spinal tenderness Neuro: Right foot drop, otherwise nonfocal, well oriented, positive affect Breasts: Deferred   LAB RESULTS:  CMP     Component Value Date/Time   NA 142 09/12/2019 0853   K 4.2 09/12/2019 0853   CL 109 09/12/2019 0853   CO2 25 09/12/2019 0853   GLUCOSE 138 (H) 09/12/2019 0853   BUN 18 09/12/2019 0853   CREATININE 0.77 09/12/2019 0853    CREATININE 0.74 01/31/2018 1358   CALCIUM 9.8 09/12/2019 0853   PROT 5.9 (L) 09/12/2019 0853   ALBUMIN 3.8 09/12/2019 0853   AST 11 (L) 09/12/2019 0853   AST 11 (L) 01/31/2018 1358   ALT 13 09/12/2019 0853   ALT 13 01/31/2018 1358   ALKPHOS 74 09/12/2019 0853   BILITOT 0.8 09/12/2019 0853   BILITOT 0.6 01/31/2018 1358   GFRNONAA >60 09/12/2019 0853   GFRNONAA >60 01/31/2018 1358   GFRAA >60 09/12/2019 0853   GFRAA >60 01/31/2018 1358    No results found for: TOTALPROTELP, ALBUMINELP, A1GS, A2GS, BETS, BETA2SER, GAMS, MSPIKE, SPEI  No results found for: KPAFRELGTCHN, LAMBDASER, KAPLAMBRATIO  Lab Results  Component Value Date   WBC 4.5 09/12/2019   NEUTROABS 2.0 09/12/2019   HGB 13.4 09/12/2019   HCT 40.2 09/12/2019   MCV 85.2 09/12/2019   PLT 97 (L) 09/12/2019   No results found for: LABCA2  No components found for: UMPNTI144  No results for input(s): INR in the last 168 hours.  No results found for: LABCA2  No results found for: RXV400  No results found for: QQP619  No results found for: JKD326  No results found for: CA2729  No components found for: HGQUANT  No results found for: CEA1 / No results found for: CEA1   No results found for: AFPTUMOR  No results found for: CHROMOGRNA  No results found for: HGBA, HGBA2QUANT, HGBFQUANT, HGBSQUAN (Hemoglobinopathy evaluation)   Lab Results  Component Value Date   LDH 252 (H) 09/12/2019    No results found for: IRON, TIBC, IRONPCTSAT (Iron and TIBC)  No results found for: FERRITIN  Urinalysis    Component Value Date/Time   COLORURINE YELLOW 05/13/2009 [place] 05/13/2009 0821   LABSPEC 1.009 05/13/2009 0821   PHURINE 7.0 05/13/2009 0821   GLUCOSEU NEGATIVE 05/13/2009 0821   HGBUR NEGATIVE 05/13/2009 0821   BILIRUBINUR NEGATIVE 05/13/2009 0821   KETONESUR NEGATIVE 05/13/2009 0821   PROTEINUR NEGATIVE 05/13/2009 0821   UROBILINOGEN 0.2 05/13/2009 0821   NITRITE NEGATIVE 05/13/2009  0821   LEUKOCYTESUR TRACE (A) 05/13/2009 0821     STUDIES: No results found.   ELIGIBLE FOR AVAILABLE RESEARCH PROTOCOL: no  ASSESSMENT: 79 y.o. [place], [place] woman with chronic lymphoid leukemia initially diagnosed 2001, Rai stage 0; CD 20 and CD5 positive, CD23 and CD10 negative, lambda restricted; FISH studies February 2015 showing trisomy 12 and deletion 13 (heterozygous), but no t(11,14) and unremarkable TP53 and ATM;  not requiring treatment to date  (1) total IgG less than 450 as of March 2019  (2) moderate thrombocytopenia, stable   PLAN: Adaijah is now 20 years out from initial diagnosis of chronic lymphoid leukemia.  She has never required treatment.  We are following her on a yearly basis indefinitely.  She had her husband have received the Pfizer vaccine and I have recommended they also receive a booster shot.  She is currently very busy taking care of her husband who has had significant medical issues this year, as well as her own medical problems.  She is very motivated to maintain her health largely because of her 5 year old grandson whom she wants to see graduate from college.  She knows to call for any other issue that may develop before the next visit  Total encounter time 20 minutes.Sarajane Jews C. Sandra Brents, MD  09/13/19 5:06 PM Medical Oncology and Hematology [facility] [place], [place] [address] Tel. [phone]    Fax. [phone]   I, Wilburn Mylar, am acting as scribe for Dr. Virgie Dad. Nastassja Witkop.  I, Lurline Del MD, have reviewed the above documentation for accuracy and completeness, and I agree with the above.   *Total Encounter Time as defined by the Centers for Medicare and Medicaid Services includes, in addition to the face-to-face time of a patient visit (documented in the note above) non-face-to-face time: obtaining and reviewing outside history, ordering and reviewing medications, tests or procedures, care  coordination (communications with other health care professionals or caregivers) and documentation in the medical record.

## 2019-10-10 ENCOUNTER — Telehealth: Payer: Self-pay | Admitting: Cardiovascular Disease

## 2019-10-10 NOTE — Telephone Encounter (Signed)
Spoke with patient of Dr. Sallyanne Kuster - she reports high HR over the last few days, up to 147bpm. She has atrial tachycardia and PAF. She reports chronic leukemia and sweating episodes related to this.   She has been having episodes off and on of high HR since January. She was able to control tachycardiac episodes up until now (drinking gatorade, eating chips). She reports more episodes early 2021 when her husband has having heart issues but they had improved until recently. She reports her HR will bounce around from 90s to 140s.  She has been using metoprolol tartrate 12.5mg  as needed - she took dose last night and then morning.   She has not checked her BP in the last few weeks.     Advised that she needs an in-person appointment as her last was over 1 year ago. Scheduled for OV on 9/22 @ 3:15pm with Angie PA  Will notify Dr. Sallyanne Kuster also

## 2019-10-10 NOTE — Progress Notes (Signed)
Cardiology Office Note:    Date:  10/11/2019   ID:  Tasha [name], DOB 04-02-1940, MRN [id number]  PCP:  Lavone Orn, MD  Cardiologist:  Sanda Klein, MD   Referring MD: Lavone Orn, MD   Chief Complaint  Patient presents with  . Palpitations    History of Present Illness:    Tasha [name] is a 79 y.o. female with a hx of PAF and paroxysmal atrial tachycardia, CLL with mild thrombocytopenia, MS, ocular migraines, and idiopathic peripheral neuropathy. She follows with Dr. Sallyanne Kuster and was last seen 09/2018. She stopped taking daily lopressor and experienced a reduction in palpitations. She takes BB PRN. Hx of normal echo at [facility] in 2013, per Dr. Victorino December last note. Echo in 2019 showed preserved EF but grade 1 DD, no RWMA,   She called our office with an increase in palpitations. She generally controls these with lopressor and uses gatorade and salty foods to avoid hypotension due to BB.  She has been under increased stress since Jan 2021 - husband diagnosed with Afib, then MVP, fall in Jan 2021 and subsequent neurological problems (confusion, pacing). He had MVR in April 2021, renal aneurysm due to coumadin. She is also raising a 106yo grandson. She feels like her palpitations started due to stress. Palpitations were weekly starting in January. Over the last month, palpitations are becoming more frequent, sometimes daily. She previously did not take 12.5 mg lopressor regularly and has trouble with significant hypotension. She now asks for a fresh prescription for lopressor. She denies chest pain, SOB, and syncope, although the frequent palpitations have added to her anxiety/stress.    Past Medical History:  Diagnosis Date  . Atrial fibrillation ([place])    questionable, echo 03/23/11 - EF >55%, on warfarin  . Bell's palsy   . 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 Charcot-Marie disease   . 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 Gait disturbance   .  Gallstones   . 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 IFG (impaired fasting glucose)    e  . Personal history of other diseases of circulatory system   . Tick bite   . Unspecified hereditary and idiopathic peripheral neuropathy   . Vitamin B12 deficiency   . Vitamin D deficiency     Past Surgical History:  Procedure Laterality Date  . ABDOMINAL HYSTERECTOMY  1984   fibroids  . BREAST SURGERY  1960s   benign breast tumors  . CATARACT EXTRACTION W/ INTRAOCULAR LENS  IMPLANT, BILATERAL    . LAPAROSCOPIC BILATERAL SALPINGO OOPHERECTOMY  2003   ovarian cyst    Current Medications: Current Meds  Medication Sig  . apixaban (ELIQUIS) 5 MG TABS tablet TAKE 1 TABLET(5 MG) BY MOUTH TWICE DAILY  . cholecalciferol (VITAMIN D) 1000 units tablet Take 1,000 Units by mouth daily.  . Cyanocobalamin (VITAMIN B 12 PO) Take 100 mg by mouth daily.      Allergies:   Clindamycin, Celebrex [celecoxib], Statins, Tape, Tricor [fenofibrate], Amoxicillin, Clarithromycin, and Latex   Social History   Socioeconomic History  . Marital status: Married    Spouse name: Colen  . Number of children: 3  . Years of education: college  . Highest education level: Not on file  Occupational History    Comment: Retired  Tobacco Use  . Smoking status: Never Smoker  . Smokeless tobacco: Never Used  Substance and Sexual Activity  . Alcohol use: No    Alcohol/week: 0.0 standard drinks  .  Drug use: No  . Sexual activity: Not on file  Other Topics Concern  . 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 Patient has college education.    Right handed.   Caffeine- not every day . Soda or tea when she is out for dinner.   Social Determinants of Health   Financial Resource Strain:   . Difficulty of Paying Living Expenses: Not on file  Food Insecurity:   . Worried About Charity fundraiser in the Last Year: Not on file  . Ran Out of Food in the Last Year: Not on  file  Transportation Needs:   . Lack of Transportation (Medical): Not on file  . Lack of Transportation (Non-Medical): Not on file  Physical Activity:   . Days of Exercise per Week: Not on file  . Minutes of Exercise per Session: Not on file  Stress:   . Feeling of Stress : Not on file  Social Connections:   . Frequency of Communication with Friends and Family: Not on file  . Frequency of Social Gatherings with Friends and Family: Not on file  . Attends Religious Services: Not on file  . Active Member of Clubs or Organizations: Not on file  . Attends Archivist Meetings: Not on file  . Marital Status: Not on file     Family History: The patient's family history includes Cancer in her daughter; Coronary artery disease in her brother and father; Diabetes in her brother and father; Hypertension in her brother, father, and mother.  ROS:   Please see the history of present illness.     All other systems reviewed and are negative.  EKGs/Labs/Other Studies Reviewed:    The following studies were reviewed today:  11/01/2017 echo - Left ventricle: The cavity size was normal. There was severe concentric hypertrophy. Systolic function was vigorous. The estimated ejection fraction was in the range of 65% to 70%. Wall motion was normal; there were no regional wall motion abnormalities. Doppler parameters are consistent with abnormal left ventricular relaxation (grade 1 diastolic dysfunction). - Aortic valve: Trileaflet; mildly thickened leaflets. - Mitral valve: Moderately thickened, mildly calcified leaflets . - Right ventricle: Systolic function was normal. - Right atrium: The atrium was normal in size. - Tricuspid valve: There was mild regurgitation. - Pulmonary arteries: Systolic pressure was within the normal range. - Pericardium, extracardiac: There was no pericardial effusion.   EKG:  EKG is  ordered today.  The ekg ordered today demonstrates sinus rhythm  HR 79, T wave flattening lateral leads  (EKG sitting up in a chair)  Recent Labs: 09/12/2019: ALT 13; BUN 18; Creatinine, Ser 0.77; Hemoglobin 13.4; Platelets 97; Potassium 4.2; Sodium 142  Recent Lipid Panel No results found for: CHOL, TRIG, HDL, CHOLHDL, VLDL, LDLCALC, LDLDIRECT  Physical Exam:    VS:  BP (!) 117/53   Pulse 79   Temp (!) 97.3 F (36.3 C)   Ht 5\' 7"  (1.702 m)   Wt 210 lb 12.8 oz (95.6 kg)   SpO2 98%   BMI 33.02 kg/m     Wt Readings from Last 3 Encounters:  10/11/19 210 lb 12.8 oz (95.6 kg)  09/12/19 209 lb 9.6 oz (95.1 kg)  04/05/19 199 lb (90.3 kg)     GEN: Well nourished, well developed in no acute distress HEENT: Normal NECK: No JVD; No carotid bruits LYMPHATICS: No lymphadenopathy CARDIAC: RRR, no murmurs, rubs, gallops RESPIRATORY:  Clear to  auscultation without rales, wheezing or rhonchi  ABDOMEN: Soft, non-tender, non-distended MUSCULOSKELETAL:  No edema; No deformity  SKIN: Warm and dry NEUROLOGIC:  Alert and oriented x 3 PSYCHIATRIC:  Normal affect   ASSESSMENT:    1. Paroxysmal atrial fibrillation (HCC)   2. Atrial tachycardia (HCC)   3. Palpitations   4. Long term (current) use of anticoagulants   5. Hypotension, unspecified hypotension type   6. 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   Will see her back in 1 month.   Medication Adjustments/Labs and Tests Ordered: Current medicines are reviewed at length with the patient today.  Concerns regarding medicines are outlined above.  Orders Placed This Encounter  Procedures  . EKG 12-Lead   Meds ordered this encounter  Medications  . metoprolol tartrate (LOPRESSOR) 25 MG tablet    Sig: Take 0.5 tablets (12.5 mg total) by mouth 2 (two) times daily.    Dispense:  90 tablet    Refill:  3    Signed, Ledora Bottcher, [place]  10/11/2019 4:14 PM    [facility]

## 2019-10-10 NOTE — Telephone Encounter (Signed)
  STAT if HR is under 50 or over 120 (normal HR is 60-100 beats per minute)  1) What is your heart rate? Left side 147  Right side 98 (has MS on rt side)  2) Do you have a log of your heart rate readings (document readings)? Yes, highest today 147   3) Do you have any other symptoms? Feels like she may pass out when she goes to sit down. Has not passed out. Patient has not checked her BP recently.

## 2019-10-11 ENCOUNTER — Ambulatory Visit (INDEPENDENT_AMBULATORY_CARE_PROVIDER_SITE_OTHER): Payer: Medicare Other | Admitting: Physician Assistant

## 2019-10-11 ENCOUNTER — Other Ambulatory Visit: Payer: Self-pay

## 2019-10-11 ENCOUNTER — Encounter: Payer: Self-pay | Admitting: Physician Assistant

## 2019-10-11 VITALS — BP 117/53 | HR 79 | Temp 97.3°F | Ht 67.0 in | Wt 210.8 lb

## 2019-10-11 DIAGNOSIS — I471 Supraventricular tachycardia: Secondary | ICD-10-CM

## 2019-10-11 DIAGNOSIS — F439 Reaction to severe stress, unspecified: Secondary | ICD-10-CM | POA: Diagnosis not present

## 2019-10-11 DIAGNOSIS — I959 Hypotension, unspecified: Secondary | ICD-10-CM

## 2019-10-11 DIAGNOSIS — R002 Palpitations: Secondary | ICD-10-CM

## 2019-10-11 DIAGNOSIS — I480 Paroxysmal atrial fibrillation: Secondary | ICD-10-CM

## 2019-10-11 DIAGNOSIS — Z7901 Long term (current) use of anticoagulants: Secondary | ICD-10-CM

## 2019-10-11 MED ORDER — METOPROLOL TARTRATE 25 MG PO TABS: 12.5000 mg | ORAL_TABLET | Freq: Two times a day (BID) | ORAL | 3 refills | Status: DC

## 2019-10-11 NOTE — Patient Instructions (Signed)
Medication Instructions:  Your physician recommends that you continue on your current medications as directed. Please refer to the Current Medication list given to you today.  *If you need a refill on your cardiac medications before your next appointment, please call your pharmacy*   Follow-Up: At [facility], you and your health needs are our priority.  As part of our continuing mission to provide you with exceptional heart care, we have created designated Provider Care Teams.  These Care Teams include your primary Cardiologist (physician) and Advanced Practice Providers (APPs -  Physician Assistants and Nurse Practitioners) who all work together to provide you with the care you need, when you need it.  We recommend signing up for the patient portal called "MyChart".  Sign up information is provided on this After Visit Summary.  MyChart is used to connect with patients for Virtual Visits (Telemedicine).  Patients are able to view lab/test results, encounter notes, upcoming appointments, etc.  Non-urgent messages can be sent to your provider as well.   To learn more about what you can do with MyChart, go to [email or web address].    Your next appointment:   Monday, 11/27/19 at 3:15 PM  The format for your next appointment:   In Person  Provider:   Fabian Sharp, PA-C

## 2019-10-12 NOTE — Progress Notes (Signed)
Thanks, agree. Writing a novel is a unique way to cope with daily stressors - hope she does it!

## 2019-10-26 ENCOUNTER — Telehealth: Payer: Self-pay | Admitting: Cardiovascular Disease

## 2019-10-26 NOTE — Telephone Encounter (Signed)
lvm for patient to return call to get follow up scheduled with [place] from recall list

## 2019-10-27 NOTE — Telephone Encounter (Signed)
    Pt was seen by Fabian Sharp on 09/22. No need f/u with DR. C

## 2019-10-30 ENCOUNTER — Telehealth: Payer: Self-pay | Admitting: Cardiovascular Disease

## 2019-10-30 DIAGNOSIS — I480 Paroxysmal atrial fibrillation: Secondary | ICD-10-CM

## 2019-10-30 MED ORDER — APIXABAN 5 MG PO TABS: ORAL_TABLET | ORAL | 1 refills | Status: DC

## 2019-10-30 NOTE — Telephone Encounter (Signed)
*  STAT* If patient is at the pharmacy, call can be transferred to refill team.   1. Which medications need to be refilled? (please list name of each medication and dose if known) Eliquis- Please call today if possible   2. Which pharmacy/location (including street and city if local pharmacy) is medication to be sent to? [place], [place]  3. Do they need a 30 day or 90 day supply? 60 and refills

## 2019-10-30 NOTE — Telephone Encounter (Signed)
Prescription refill request for Eliquis received. Indication: a fib Last office visit: 10/11/19 Scr: 0.77 Age: 79 Weight: 95kg

## 2019-11-08 DIAGNOSIS — Z23 Encounter for immunization: Secondary | ICD-10-CM | POA: Diagnosis not present

## 2019-11-26 NOTE — Progress Notes (Signed)
Cardiology Office Note:    Date:  11/27/2019   ID:  Tasha [name], DOB [birth year], MRN [id number]  PCP:  Lavone Orn, MD  Cardiologist:  Sanda Klein, MD   Referring MD: Lavone Orn, MD   Chief Complaint  Patient presents with  . Follow-up    PAF    History of Present Illness:    Tasha [name] is a 79 y.o. female with a hx of PAF and paroxysmal atrial tachycardia, CLL with mild thrombocytopenia, MS, ocular migraines, and idiopathic peripheral neuropathy. She follows with Dr. Sallyanne Kuster and was last seen 09/2018. She stopped taking daily lopressor and experienced a reduction in palpitations. She takes BB PRN. Hx of normal echo at [facility] in 2013, per Dr. Victorino December last note. Echo in 2019 showed preserved EF but grade 1 DD, no RWMA, severe concentric hypertrophy.  I saw her in clinic 10/11/19 with increased palpitations. She had been taking 12.5 mg lopressor and asked for a new prescription. She was under a lot of stress with her husband and raising 28yo grandson. She returns today for scheduled follow up.  She is doing fairly well on lopressor 12.5 mg BID. She did not sleep Monday night which triggered palpitations Thurs and Friday, no syncope or falls. She did not take extra lopressor due to risk of hypotension. She is still dealing with stress with her husband and planning her granddaughter's wedding.   Considering her stress level, I think she is doing well. I encouraged her to find ways to take some time for herself. She reports some trouble with remembering to take lopressor every 12 hrs. We discussed succinate and she wants to wait on this.     Past Medical History:  Diagnosis Date  . Atrial fibrillation ([place])    questionable, echo 03/23/11 - EF >55%, on warfarin  . Bell's palsy   . 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 Charcot-Marie disease   . 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 Gait disturbance   . Gallstones   . 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 IFG (impaired fasting glucose)    e  . Personal history of other diseases of circulatory system   . Tick bite   . Unspecified hereditary and idiopathic peripheral neuropathy   . Vitamin B12 deficiency   . Vitamin D deficiency     Past Surgical History:  Procedure Laterality Date  . ABDOMINAL HYSTERECTOMY  1984   fibroids  . BREAST SURGERY  1960s   benign breast tumors  . CATARACT EXTRACTION W/ INTRAOCULAR LENS  IMPLANT, BILATERAL    . LAPAROSCOPIC BILATERAL SALPINGO OOPHERECTOMY  2003   ovarian cyst    Current Medications: Current Meds  Medication Sig  . apixaban (ELIQUIS) 5 MG TABS tablet TAKE 1 TABLET(5 MG) BY MOUTH TWICE DAILY  . cholecalciferol (VITAMIN D) 1000 units tablet Take 1,000 Units by mouth daily.  . Cyanocobalamin (VITAMIN B 12 PO) Take 100 mg by mouth daily.   . metoprolol tartrate (LOPRESSOR) 25 MG tablet Take 0.5 tablets (12.5 mg total) by mouth 2 (two) times daily.     Allergies:   Clindamycin, Celebrex [celecoxib], Statins, Tape, Tricor [fenofibrate], Amoxicillin, Clarithromycin, and Latex   Social History   Socioeconomic History  . Marital status: Married    Spouse name: Colen  . Number of children: 3  . Years of education: college  . Highest education level: Not on file  Occupational History    Comment: Retired  Tobacco Use  .  Smoking status: Never Smoker  . Smokeless tobacco: Never Used  Substance and Sexual Activity  . Alcohol use: No    Alcohol/week: 0.0 standard drinks  . Drug use: No  . Sexual activity: Not on file  Other Topics Concern  . 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 Patient has college education.    Right handed.   Caffeine- not every day . Soda or tea when she is out for dinner.   Social Determinants of Health   Financial Resource Strain:   . Difficulty of Paying Living Expenses: Not on file  Food Insecurity:   . Worried About Charity fundraiser in  the Last Year: Not on file  . Ran Out of Food in the Last Year: Not on file  Transportation Needs:   . Lack of Transportation (Medical): Not on file  . Lack of Transportation (Non-Medical): Not on file  Physical Activity:   . Days of Exercise per Week: Not on file  . Minutes of Exercise per Session: Not on file  Stress:   . Feeling of Stress : Not on file  Social Connections:   . Frequency of Communication with Friends and Family: Not on file  . Frequency of Social Gatherings with Friends and Family: Not on file  . Attends Religious Services: Not on file  . Active Member of Clubs or Organizations: Not on file  . Attends Archivist Meetings: Not on file  . Marital Status: Not on file     Family History: The patient's family history includes Cancer in her daughter; Coronary artery disease in her brother and father; Diabetes in her brother and father; Hypertension in her brother, father, and mother.  ROS:   Please see the history of present illness.     All other systems reviewed and are negative.  EKGs/Labs/Other Studies Reviewed:    The following studies were reviewed today:  11/01/2017 echo - Left ventricle: The cavity size was normal. There was severe concentric hypertrophy. Systolic function was vigorous. The estimated ejection fraction was in the range of 65% to 70%. Wall motion was normal; there were no regional wall motion abnormalities. Doppler parameters are consistent with abnormal left ventricular relaxation (grade 1 diastolic dysfunction). - Aortic valve: Trileaflet; mildly thickened leaflets. - Mitral valve: Moderately thickened, mildly calcified leaflets . - Right ventricle: Systolic function was normal. - Right atrium: The atrium was normal in size. - Tricuspid valve: There was mild regurgitation. - Pulmonary arteries: Systolic pressure was within the normal range. - Pericardium, extracardiac: There was no pericardial effusion.  EKG:   EKG is not ordered today.   Recent Labs: 09/12/2019: ALT 13; BUN 18; Creatinine, Ser 0.77; Hemoglobin 13.4; Platelets 97; Potassium 4.2; Sodium 142  Recent Lipid Panel No results found for: CHOL, TRIG, HDL, CHOLHDL, VLDL, LDLCALC, LDLDIRECT  Physical Exam:    VS:  BP (!) 114/58   Pulse 91   Ht 5\' 7"  (1.702 m)   Wt 209 lb 9.6 oz (95.1 kg)   SpO2 94%   BMI 32.83 kg/m     Wt Readings from Last 3 Encounters:  11/27/19 209 lb 9.6 oz (95.1 kg)  10/11/19 210 lb 12.8 oz (95.6 kg)  09/12/19 209 lb 9.6 oz (95.1 kg)     GEN: elderly female, Well nourished, well developed in no acute distress HEENT: Normal NECK: No JVD; No carotid bruits LYMPHATICS: No lymphadenopathy CARDIAC: RRR, no murmurs, rubs, gallops RESPIRATORY:  Clear to auscultation without rales, wheezing or rhonchi  ABDOMEN: Soft, non-tender, non-distended MUSCULOSKELETAL:  No edema; No deformity  SKIN: Warm and dry NEUROLOGIC:  Alert and oriented x 3 PSYCHIATRIC:  Normal affect   ASSESSMENT:    1. Paroxysmal atrial fibrillation ([facility])   2. Atrial tachycardia ([place])   3. Chronic anticoagulation    PLAN:    In order of problems listed above:  PAF Paroxysmal atrial tachycardia Chronic anticoagulation - eliquis 5 mg BID - she is doing well on 12.5 mg lopressor BID - will consider switching to succinate for ease of administration - palpitations last week related to insomnia, no syncope or falls   Stress management - she has been reading in the car pickup line in the afternoons   Follow up with Dr. Sallyanne Kuster in 4-5 months, sooner if needed.   Medication Adjustments/Labs and Tests Ordered: Current medicines are reviewed at length with the patient today.  Concerns regarding medicines are outlined above.  No orders of the defined types were placed in this encounter.  No orders of the defined types were placed in this encounter.   Signed, Ledora Bottcher, PA  11/27/2019 4:05 PM    [facility]

## 2019-11-27 ENCOUNTER — Ambulatory Visit (INDEPENDENT_AMBULATORY_CARE_PROVIDER_SITE_OTHER): Payer: Medicare Other | Admitting: Physician Assistant

## 2019-11-27 ENCOUNTER — Encounter: Payer: Self-pay | Admitting: Physician Assistant

## 2019-11-27 ENCOUNTER — Other Ambulatory Visit: Payer: Self-pay

## 2019-11-27 VITALS — BP 114/58 | HR 91 | Ht 67.0 in | Wt 209.6 lb

## 2019-11-27 DIAGNOSIS — I471 Supraventricular tachycardia: Secondary | ICD-10-CM

## 2019-11-27 DIAGNOSIS — M8580 Other specified disorders of bone density and structure, unspecified site: Secondary | ICD-10-CM | POA: Diagnosis not present

## 2019-11-27 DIAGNOSIS — Z23 Encounter for immunization: Secondary | ICD-10-CM | POA: Diagnosis not present

## 2019-11-27 DIAGNOSIS — F439 Reaction to severe stress, unspecified: Secondary | ICD-10-CM | POA: Diagnosis not present

## 2019-11-27 DIAGNOSIS — Z7901 Long term (current) use of anticoagulants: Secondary | ICD-10-CM | POA: Diagnosis not present

## 2019-11-27 DIAGNOSIS — Z0000 Encounter for general adult medical examination without abnormal findings: Secondary | ICD-10-CM | POA: Diagnosis not present

## 2019-11-27 DIAGNOSIS — G609 Hereditary and idiopathic neuropathy, unspecified: Secondary | ICD-10-CM | POA: Diagnosis not present

## 2019-11-27 DIAGNOSIS — I480 Paroxysmal atrial fibrillation: Secondary | ICD-10-CM

## 2019-11-27 DIAGNOSIS — C9110 Chronic lymphocytic leukemia of B-cell type not having achieved remission: Secondary | ICD-10-CM | POA: Diagnosis not present

## 2019-11-27 DIAGNOSIS — Z1389 Encounter for screening for other disorder: Secondary | ICD-10-CM | POA: Diagnosis not present

## 2019-11-27 NOTE — Patient Instructions (Signed)
Medication Instructions:  No Changes *If you need a refill on your cardiac medications before your next appointment, please call your pharmacy*   Lab Work: No Labs If you have labs (blood work) drawn today and your tests are completely normal, you will receive your results only by: Marland Kitchen MyChart Message (if you have MyChart) OR . A paper copy in the mail If you have any lab test that is abnormal or we need to change your treatment, we will call you to review the results.   Testing/Procedures: No Testing   Follow-Up: At [facility], you and your health needs are our priority.  As part of our continuing mission to provide you with exceptional heart care, we have created designated Provider Care Teams.  These Care Teams include your primary Cardiologist (physician) and Advanced Practice Providers (APPs -  Physician Assistants and Nurse Practitioners) who all work together to provide you with the care you need, when you need it.  Your next appointment:   4 month(s)  The format for your next appointment:   In Person  Provider:   Sanda Klein, MD

## 2019-11-28 NOTE — Progress Notes (Signed)
Thanks

## 2020-03-18 DIAGNOSIS — Z961 Presence of intraocular lens: Secondary | ICD-10-CM | POA: Diagnosis not present

## 2020-03-18 DIAGNOSIS — H524 Presbyopia: Secondary | ICD-10-CM | POA: Diagnosis not present

## 2020-04-03 DIAGNOSIS — R197 Diarrhea, unspecified: Secondary | ICD-10-CM | POA: Diagnosis not present

## 2020-04-04 ENCOUNTER — Inpatient Hospital Stay (HOSPITAL_COMMUNITY)
Admission: EM | Admit: 2020-04-04 | Discharge: 2020-04-12 | DRG: 871 | Disposition: A | Discharge status: Home Health | Payer: Medicare Other | Attending: Internal Medicine | Admitting: Internal Medicine

## 2020-04-04 ENCOUNTER — Other Ambulatory Visit: Payer: Self-pay

## 2020-04-04 DIAGNOSIS — Z7901 Long term (current) use of anticoagulants: Secondary | ICD-10-CM

## 2020-04-04 DIAGNOSIS — G510 Bell's palsy: Secondary | ICD-10-CM | POA: Diagnosis present

## 2020-04-04 DIAGNOSIS — J1282 Pneumonia due to coronavirus disease 2019: Secondary | ICD-10-CM | POA: Diagnosis not present

## 2020-04-04 DIAGNOSIS — A4189 Other specified sepsis: Principal | ICD-10-CM | POA: Diagnosis present

## 2020-04-04 DIAGNOSIS — Q928 Other specified trisomies and partial trisomies of autosomes: Secondary | ICD-10-CM

## 2020-04-04 DIAGNOSIS — E876 Hypokalemia: Secondary | ICD-10-CM | POA: Diagnosis present

## 2020-04-04 DIAGNOSIS — E039 Hypothyroidism, unspecified: Secondary | ICD-10-CM | POA: Diagnosis not present

## 2020-04-04 DIAGNOSIS — E871 Hypo-osmolality and hyponatremia: Secondary | ICD-10-CM | POA: Diagnosis not present

## 2020-04-04 DIAGNOSIS — U071 COVID-19: Secondary | ICD-10-CM | POA: Diagnosis present

## 2020-04-04 DIAGNOSIS — Z9841 Cataract extraction status, right eye: Secondary | ICD-10-CM

## 2020-04-04 DIAGNOSIS — E669 Obesity, unspecified: Secondary | ICD-10-CM | POA: Diagnosis present

## 2020-04-04 DIAGNOSIS — J90 Pleural effusion, not elsewhere classified: Secondary | ICD-10-CM | POA: Diagnosis not present

## 2020-04-04 DIAGNOSIS — C9110 Chronic lymphocytic leukemia of B-cell type not having achieved remission: Secondary | ICD-10-CM | POA: Diagnosis present

## 2020-04-04 DIAGNOSIS — A0839 Other viral enteritis: Secondary | ICD-10-CM | POA: Diagnosis not present

## 2020-04-04 DIAGNOSIS — Z91040 Latex allergy status: Secondary | ICD-10-CM

## 2020-04-04 DIAGNOSIS — Z9842 Cataract extraction status, left eye: Secondary | ICD-10-CM

## 2020-04-04 DIAGNOSIS — R6510 Systemic inflammatory response syndrome (SIRS) of non-infectious origin without acute organ dysfunction: Secondary | ICD-10-CM

## 2020-04-04 DIAGNOSIS — Z91048 Other nonmedicinal substance allergy status: Secondary | ICD-10-CM

## 2020-04-04 DIAGNOSIS — Z8249 Family history of ischemic heart disease and other diseases of the circulatory system: Secondary | ICD-10-CM

## 2020-04-04 DIAGNOSIS — A419 Sepsis, unspecified organism: Secondary | ICD-10-CM | POA: Diagnosis present

## 2020-04-04 DIAGNOSIS — Z880 Allergy status to penicillin: Secondary | ICD-10-CM

## 2020-04-04 DIAGNOSIS — I480 Paroxysmal atrial fibrillation: Secondary | ICD-10-CM | POA: Diagnosis present

## 2020-04-04 DIAGNOSIS — E0590 Thyrotoxicosis, unspecified without thyrotoxic crisis or storm: Secondary | ICD-10-CM | POA: Diagnosis not present

## 2020-04-04 DIAGNOSIS — I951 Orthostatic hypotension: Secondary | ICD-10-CM | POA: Diagnosis present

## 2020-04-04 DIAGNOSIS — G600 Hereditary motor and sensory neuropathy: Secondary | ICD-10-CM | POA: Diagnosis present

## 2020-04-04 DIAGNOSIS — R9431 Abnormal electrocardiogram [ECG] [EKG]: Secondary | ICD-10-CM | POA: Diagnosis present

## 2020-04-04 DIAGNOSIS — Z6833 Body mass index (BMI) 33.0-33.9, adult: Secondary | ICD-10-CM

## 2020-04-04 DIAGNOSIS — I5032 Chronic diastolic (congestive) heart failure: Secondary | ICD-10-CM | POA: Diagnosis not present

## 2020-04-04 DIAGNOSIS — D696 Thrombocytopenia, unspecified: Secondary | ICD-10-CM | POA: Diagnosis present

## 2020-04-04 DIAGNOSIS — R0602 Shortness of breath: Secondary | ICD-10-CM

## 2020-04-04 DIAGNOSIS — G35 Multiple sclerosis: Secondary | ICD-10-CM | POA: Diagnosis present

## 2020-04-04 DIAGNOSIS — E559 Vitamin D deficiency, unspecified: Secondary | ICD-10-CM | POA: Diagnosis present

## 2020-04-04 DIAGNOSIS — Z888 Allergy status to other drugs, medicaments and biological substances status: Secondary | ICD-10-CM

## 2020-04-04 DIAGNOSIS — R000 Tachycardia, unspecified: Secondary | ICD-10-CM | POA: Diagnosis not present

## 2020-04-04 DIAGNOSIS — Z79899 Other long term (current) drug therapy: Secondary | ICD-10-CM

## 2020-04-04 DIAGNOSIS — Z961 Presence of intraocular lens: Secondary | ICD-10-CM | POA: Diagnosis present

## 2020-04-04 DIAGNOSIS — R531 Weakness: Secondary | ICD-10-CM

## 2020-04-04 DIAGNOSIS — Z833 Family history of diabetes mellitus: Secondary | ICD-10-CM

## 2020-04-04 LAB — COMPREHENSIVE METABOLIC PANEL
ALT: 36 U/L (ref 0–44)
AST: 49 U/L — ABNORMAL HIGH (ref 15–41)
Albumin: 2.9 g/dL — ABNORMAL LOW (ref 3.5–5.0)
Alkaline Phosphatase: 45 U/L (ref 38–126)
Anion gap: 9 (ref 5–15)
BUN: 8 mg/dL (ref 8–23)
CO2: 21 mmol/L — ABNORMAL LOW (ref 22–32)
Calcium: 8.3 mg/dL — ABNORMAL LOW (ref 8.9–10.3)
Chloride: 97 mmol/L — ABNORMAL LOW (ref 98–111)
Creatinine, Ser: 0.76 mg/dL (ref 0.44–1.00)
GFR, Estimated: >60 mL/min (ref >60)
Glucose, Bld: 135 mg/dL — ABNORMAL HIGH (ref 70–99)
Potassium: 3.2 mmol/L — ABNORMAL LOW (ref 3.5–5.1)
Sodium: 127 mmol/L — ABNORMAL LOW (ref 135–145)
Total Bilirubin: 1.4 mg/dL — ABNORMAL HIGH (ref 0.3–1.2)
Total Protein: 5.4 g/dL — ABNORMAL LOW (ref 6.5–8.1)

## 2020-04-04 LAB — CBC
HCT: 38.2 % (ref 36.0–46.0)
Hemoglobin: 13.3 g/dL (ref 12.0–15.0)
MCH: 27.8 pg (ref 26.0–34.0)
MCHC: 34.8 g/dL (ref 30.0–36.0)
MCV: 79.7 fL — ABNORMAL LOW (ref 80.0–100.0)
Platelets: 101 10*3/uL — ABNORMAL LOW (ref 150–400)
RBC: 4.79 MIL/uL (ref 3.87–5.11)
RDW: 14.3 % (ref 11.5–15.5)
WBC: 5.4 10*3/uL (ref 4.0–10.5)
nRBC: 0 % (ref 0.0–0.2)

## 2020-04-04 LAB — LIPASE, BLOOD: Lipase: 22 U/L (ref 11–51)

## 2020-04-04 NOTE — ED Triage Notes (Signed)
Pt reports nausea and diarrhea on and off for 3 weeks. Pt also states she has no appetite and generalized weakness. Hx of neuropathy. Not in respiratory distress.

## 2020-04-05 ENCOUNTER — Emergency Department (HOSPITAL_COMMUNITY): Payer: Medicare Other

## 2020-04-05 ENCOUNTER — Other Ambulatory Visit: Payer: Self-pay

## 2020-04-05 DIAGNOSIS — I517 Cardiomegaly: Secondary | ICD-10-CM | POA: Diagnosis not present

## 2020-04-05 DIAGNOSIS — J90 Pleural effusion, not elsewhere classified: Secondary | ICD-10-CM | POA: Diagnosis not present

## 2020-04-05 DIAGNOSIS — I5032 Chronic diastolic (congestive) heart failure: Secondary | ICD-10-CM | POA: Diagnosis present

## 2020-04-05 DIAGNOSIS — R0602 Shortness of breath: Secondary | ICD-10-CM | POA: Diagnosis not present

## 2020-04-05 DIAGNOSIS — E871 Hypo-osmolality and hyponatremia: Secondary | ICD-10-CM | POA: Diagnosis present

## 2020-04-05 DIAGNOSIS — G35 Multiple sclerosis: Secondary | ICD-10-CM | POA: Diagnosis present

## 2020-04-05 DIAGNOSIS — Z833 Family history of diabetes mellitus: Secondary | ICD-10-CM | POA: Diagnosis not present

## 2020-04-05 DIAGNOSIS — A4189 Other specified sepsis: Secondary | ICD-10-CM | POA: Diagnosis present

## 2020-04-05 DIAGNOSIS — Q928 Other specified trisomies and partial trisomies of autosomes: Secondary | ICD-10-CM | POA: Diagnosis not present

## 2020-04-05 DIAGNOSIS — Z6833 Body mass index (BMI) 33.0-33.9, adult: Secondary | ICD-10-CM | POA: Diagnosis not present

## 2020-04-05 DIAGNOSIS — E876 Hypokalemia: Secondary | ICD-10-CM | POA: Diagnosis present

## 2020-04-05 DIAGNOSIS — U071 COVID-19: Secondary | ICD-10-CM | POA: Diagnosis present

## 2020-04-05 DIAGNOSIS — I480 Paroxysmal atrial fibrillation: Secondary | ICD-10-CM

## 2020-04-05 DIAGNOSIS — C9110 Chronic lymphocytic leukemia of B-cell type not having achieved remission: Secondary | ICD-10-CM | POA: Diagnosis present

## 2020-04-05 DIAGNOSIS — D696 Thrombocytopenia, unspecified: Secondary | ICD-10-CM | POA: Diagnosis present

## 2020-04-05 DIAGNOSIS — Z8249 Family history of ischemic heart disease and other diseases of the circulatory system: Secondary | ICD-10-CM | POA: Diagnosis not present

## 2020-04-05 DIAGNOSIS — A419 Sepsis, unspecified organism: Secondary | ICD-10-CM | POA: Diagnosis not present

## 2020-04-05 DIAGNOSIS — R9431 Abnormal electrocardiogram [ECG] [EKG]: Secondary | ICD-10-CM

## 2020-04-05 DIAGNOSIS — Z961 Presence of intraocular lens: Secondary | ICD-10-CM | POA: Diagnosis present

## 2020-04-05 DIAGNOSIS — E559 Vitamin D deficiency, unspecified: Secondary | ICD-10-CM | POA: Diagnosis present

## 2020-04-05 DIAGNOSIS — E669 Obesity, unspecified: Secondary | ICD-10-CM | POA: Diagnosis present

## 2020-04-05 DIAGNOSIS — Z9841 Cataract extraction status, right eye: Secondary | ICD-10-CM | POA: Diagnosis not present

## 2020-04-05 DIAGNOSIS — R531 Weakness: Secondary | ICD-10-CM

## 2020-04-05 DIAGNOSIS — Z7901 Long term (current) use of anticoagulants: Secondary | ICD-10-CM | POA: Diagnosis not present

## 2020-04-05 DIAGNOSIS — J1282 Pneumonia due to coronavirus disease 2019: Secondary | ICD-10-CM | POA: Diagnosis present

## 2020-04-05 DIAGNOSIS — R0603 Acute respiratory distress: Secondary | ICD-10-CM | POA: Diagnosis not present

## 2020-04-05 DIAGNOSIS — A0839 Other viral enteritis: Secondary | ICD-10-CM | POA: Diagnosis present

## 2020-04-05 DIAGNOSIS — R6510 Systemic inflammatory response syndrome (SIRS) of non-infectious origin without acute organ dysfunction: Secondary | ICD-10-CM

## 2020-04-05 DIAGNOSIS — E039 Hypothyroidism, unspecified: Secondary | ICD-10-CM | POA: Diagnosis not present

## 2020-04-05 DIAGNOSIS — E0590 Thyrotoxicosis, unspecified without thyrotoxic crisis or storm: Secondary | ICD-10-CM | POA: Diagnosis not present

## 2020-04-05 DIAGNOSIS — I951 Orthostatic hypotension: Secondary | ICD-10-CM | POA: Diagnosis present

## 2020-04-05 DIAGNOSIS — Z9842 Cataract extraction status, left eye: Secondary | ICD-10-CM | POA: Diagnosis not present

## 2020-04-05 DIAGNOSIS — I4819 Other persistent atrial fibrillation: Secondary | ICD-10-CM | POA: Diagnosis not present

## 2020-04-05 LAB — URINALYSIS, ROUTINE W REFLEX MICROSCOPIC
Bilirubin Urine: NEGATIVE
Glucose, UA: NEGATIVE mg/dL
Ketones, ur: 20 mg/dL — AB
Leukocytes,Ua: NEGATIVE
Nitrite: NEGATIVE
Protein, ur: 100 mg/dL — AB
Specific Gravity, Urine: 1.018 (ref 1.005–1.030)
pH: 5 (ref 5.0–8.0)

## 2020-04-05 LAB — BRAIN NATRIURETIC PEPTIDE: B Natriuretic Peptide: 282.2 pg/mL — ABNORMAL HIGH (ref 0.0–100.0)

## 2020-04-05 LAB — PROCALCITONIN: Procalcitonin: 0.16 ng/mL

## 2020-04-05 LAB — C-REACTIVE PROTEIN: CRP: 11.1 mg/dL — ABNORMAL HIGH (ref <1.0)

## 2020-04-05 LAB — LACTIC ACID, PLASMA
Lactic Acid, Venous: 0.9 mmol/L (ref 0.5–1.9)
Lactic Acid, Venous: 1.2 mmol/L (ref 0.5–1.9)

## 2020-04-05 LAB — POC SARS CORONAVIRUS 2 AG -  ED: SARS Coronavirus 2 Ag: POSITIVE — AB

## 2020-04-05 MED ORDER — METOPROLOL TARTRATE 12.5 MG HALF TABLET
12.5000 mg | ORAL_TABLET | Freq: Two times a day (BID) | ORAL | Status: DC
  Administered 2020-04-05 – 2020-04-07 (×5): 12.5 mg via ORAL
  Filled 2020-04-05 (×6): qty 1

## 2020-04-05 MED ORDER — ALBUTEROL SULFATE HFA 108 (90 BASE) MCG/ACT IN AERS
2.0000 | INHALATION_SPRAY | Freq: Four times a day (QID) | RESPIRATORY_TRACT | Status: DC
  Administered 2020-04-05 – 2020-04-07 (×8): 2 via RESPIRATORY_TRACT
  Filled 2020-04-05: qty 6.7

## 2020-04-05 MED ORDER — ONDANSETRON HCL 4 MG/2ML IJ SOLN: 4.0000 mg | Freq: Four times a day (QID) | INTRAMUSCULAR | Status: DC | PRN

## 2020-04-05 MED ORDER — ACETAMINOPHEN 325 MG PO TABS
650.0000 mg | ORAL_TABLET | Freq: Four times a day (QID) | ORAL | Status: DC | PRN
  Administered 2020-04-05 – 2020-04-07 (×3): 650 mg via ORAL
  Filled 2020-04-05 (×3): qty 2

## 2020-04-05 MED ORDER — SODIUM CHLORIDE 0.9 % IV SOLN: INTRAVENOUS | Status: DC

## 2020-04-05 MED ORDER — LOPERAMIDE HCL 2 MG PO CAPS: 2.0000 mg | ORAL_CAPSULE | ORAL | Status: DC | PRN

## 2020-04-05 MED ORDER — APIXABAN 5 MG PO TABS
5.0000 mg | ORAL_TABLET | Freq: Two times a day (BID) | ORAL | Status: DC
  Administered 2020-04-05 – 2020-04-12 (×15): 5 mg via ORAL
  Filled 2020-04-05 (×15): qty 1

## 2020-04-05 MED ORDER — FAMOTIDINE 20 MG PO TABS
20.0000 mg | ORAL_TABLET | Freq: Two times a day (BID) | ORAL | Status: DC
  Administered 2020-04-05 – 2020-04-12 (×15): 20 mg via ORAL
  Filled 2020-04-05 (×15): qty 1

## 2020-04-05 MED ORDER — ACETAMINOPHEN 500 MG PO TABS
1000.0000 mg | ORAL_TABLET | Freq: Once | ORAL | Status: AC
  Administered 2020-04-05: 1000 mg via ORAL
  Filled 2020-04-05: qty 2

## 2020-04-05 MED ORDER — ASCORBIC ACID 500 MG PO TABS
500.0000 mg | ORAL_TABLET | Freq: Every day | ORAL | Status: DC
  Administered 2020-04-05 – 2020-04-12 (×8): 500 mg via ORAL
  Filled 2020-04-05 (×8): qty 1

## 2020-04-05 MED ORDER — TRIMETHOBENZAMIDE HCL 300 MG PO CAPS
300.0000 mg | ORAL_CAPSULE | Freq: Two times a day (BID) | ORAL | Status: DC | PRN
  Filled 2020-04-05: qty 1

## 2020-04-05 MED ORDER — METOPROLOL TARTRATE 5 MG/5ML IV SOLN
2.5000 mg | Freq: Four times a day (QID) | INTRAVENOUS | Status: DC | PRN
  Administered 2020-04-06 – 2020-04-07 (×2): 2.5 mg via INTRAVENOUS
  Filled 2020-04-05 (×2): qty 5

## 2020-04-05 MED ORDER — SODIUM CHLORIDE 0.9 % IV BOLUS
1000.0000 mL | Freq: Once | INTRAVENOUS | Status: AC
  Administered 2020-04-05: 1000 mL via INTRAVENOUS

## 2020-04-05 MED ORDER — ENSURE ENLIVE PO LIQD
237.0000 mL | Freq: Two times a day (BID) | ORAL | Status: DC
  Administered 2020-04-05 – 2020-04-11 (×6): 237 mL via ORAL

## 2020-04-05 MED ORDER — SACCHAROMYCES BOULARDII 250 MG PO CAPS
250.0000 mg | ORAL_CAPSULE | Freq: Two times a day (BID) | ORAL | Status: DC
  Administered 2020-04-05 – 2020-04-08 (×7): 250 mg via ORAL
  Filled 2020-04-05 (×7): qty 1

## 2020-04-05 MED ORDER — SODIUM CHLORIDE 0.9 % IV SOLN: Freq: Once | INTRAVENOUS | Status: AC

## 2020-04-05 MED ORDER — POTASSIUM CHLORIDE CRYS ER 20 MEQ PO TBCR
40.0000 meq | EXTENDED_RELEASE_TABLET | ORAL | Status: AC
  Administered 2020-04-05: 40 meq via ORAL
  Filled 2020-04-05: qty 2

## 2020-04-05 MED ORDER — ONDANSETRON HCL 4 MG PO TABS: 4.0000 mg | ORAL_TABLET | Freq: Four times a day (QID) | ORAL | Status: DC | PRN

## 2020-04-05 MED ORDER — ZINC SULFATE 220 (50 ZN) MG PO CAPS
220.0000 mg | ORAL_CAPSULE | Freq: Every day | ORAL | Status: DC
  Administered 2020-04-05 – 2020-04-12 (×8): 220 mg via ORAL
  Filled 2020-04-05 (×8): qty 1

## 2020-04-05 NOTE — Progress Notes (Signed)
   04/05/20 2202  Integumentary  Integumentary (WDL) X  Skin Color Appropriate for ethnicity  Skin Condition Flaky;Dry  Skin Integrity Petechiae  Petechiae Location Knee  Petechiae Location Orientation Right;Lower;Medial  Petechiae Intervention Other (Comment) (cool compress)  Skin Turgor Non-tenting   Patient's daughter concerned about "red spots" on patient's right lower leg.  Area assessed, blanchable petechiae noted.  Patient state "it happens all the time when I have a fever."  Patient denies itchiness or pain from area.  Tylenol given, see MAR.  Cool compresses applied to lower leg.

## 2020-04-05 NOTE — Plan of Care (Signed)
Pt just arrived to unit Covid pos. Will cont to monitor pt to develop goals for care plan.

## 2020-04-05 NOTE — Progress Notes (Signed)
   04/05/20 2022  Assess: MEWS Score  Temp 97.9 F (36.6 C)  BP 97/66  Pulse Rate (!) 108  ECG Heart Rate (!) 108  Resp 20  Level of Consciousness Alert  SpO2 93 %  O2 Device Room Air  Assess: MEWS Score  MEWS Temp 0  MEWS Systolic 1  MEWS Pulse 1  MEWS RR 0  MEWS LOC 0  MEWS Score 2  MEWS Score Color Yellow  Assess: if the MEWS score is Yellow or Red  Were vital signs taken at a resting state? Yes  Focused Assessment Change from prior assessment (see assessment flowsheet)  Early Detection of Sepsis Score *See Row Information* Medium  MEWS guidelines implemented *See Row Information* Yes  Treat  MEWS Interventions Administered prn meds/treatments;Escalated (See documentation below)  Pain Scale 0-10  Pain Score 0  Pain Intervention(s) Medication (See eMAR)  Take Vital Signs  Increase Vital Sign Frequency  Yellow: Q 2hr X 2 then Q 4hr X 2, if remains yellow, continue Q 4hrs  Escalate  MEWS: Escalate Yellow: discuss with charge nurse/RN and consider discussing with provider and RRT  Notify: Charge Nurse/RN  Name of Charge Nurse/RN Notified Isiah  Date Charge Nurse/RN Notified 04/05/20  Time Charge Nurse/RN Notified 2029  Notify: Provider  Provider Name/Title Adefoso/MD  Date Provider Notified 04/05/20  Time Provider Notified 2042  Notification Type Page  Notification Reason Change in status (yellow mews)  Provider response See new orders  Date of Provider Response 04/05/20  Time of Provider Response 2118  Document  Patient Outcome Stabilized after interventions  Progress note created (see row info) Yes   Elevated MEWs noted during vital signs assessment.  Elevated MEWS due to increased temperature, blood pressure, incased heart rate.  Focussed assessment performed, diffuse wheezing noted in lung fields.  Patient initially refused to take her scheduled albuterol, stating

## 2020-04-05 NOTE — Progress Notes (Signed)
PT Cancellation Note  Patient Details Name: MADAI NUCCIO MRN: [id number] DOB: 18-Mar-1940   Cancelled Treatment:    Reason Eval/Treat Not Completed: Fatigue/lethargy limiting ability to participate. Spoke with RN who states the patient is just exhausted and just got settled in bed. Will re-attempt PT eval tomorrow.    Jalyssa Fleisher 04/05/2020, 2:08 PM

## 2020-04-05 NOTE — H&P (Addendum)
History and Physical    Tasha [name] JQB:[id number] DOB: [birth year] DOA: 04/04/2020  Referring MD/NP/PA: Addison Lank, MD PCP: Lavone Orn, MD  Consultants: Sanda Linger, MD (oncology)  Sanda Klein, MD(Cardiology) Patient coming from: Home  Chief Complaint: Diarrhea and weakness.  I have personally briefly reviewed patient's old medical records in [place]   HPI: Tasha [name] is a 80 y.o. female with medical history significant of paroxysmal atrial fibrillation on chronic anticoagulation and chronic lymphocytic leukemia not requiring treatment presents with complaints of diarrhea and weakness over the last 2-3 weeks.  She recalls getting sick February 28 about 1 week after her husband tested positive for COVID-19.  Patient had received her  [place] vaccines and booster.  She had subjective fevers, cough, nasal congestion, vomiting, and malaise.  Those symptoms seem to resolve and then she started having diarrhea.  Notes that she was going to use the bathroom several times a day.  Denied having any blood or abdominal pain associated with the diarrhea.  She had been taking Imodium, but reports that she last took it 2 days ago and has not had any recurrence of diarrhea since that time.  Ms. Heatherly also notes that she has had no appetite and has not been eating and drinking well.  She complains of some mild lower extremity swelling, but denies any significant cough or shortness of breath symptoms.  ED Course: Upon admission into the emergency department patient was seen to be febrile up to 103.1, pulse up to 130, respirations 15-21, and O2 saturation currently maintained on room air.  Point-of-care COVID-19 screening was positive.  Labs significant for platelets 101, sodium 127, potassium 3.2, chloride 97, CO2 21, calcium 8.3, albumin 2.9, AST 49, ALT 36, total bilirubin 1.4, and all other labs relatively within normal limits.  Urinalysis was significant for small hemoglobin and  20 ketones, but given no significant concern for infection.  Chest x-ray shows subtle increase in airspace disease concerning for either early pneumonia or edema.  Patient has been given 1 L normal saline IV fluids and acetaminophen 1000 mg p.o.  Review of Systems  Constitutional: Positive for malaise/fatigue. Negative for fever.  HENT: Negative for congestion and nosebleeds.   Eyes: Negative for photophobia and pain.  Respiratory: Positive for cough and shortness of breath.   Cardiovascular: Positive for leg swelling. Negative for chest pain.  Gastrointestinal: Positive for diarrhea, nausea and vomiting. Negative for abdominal pain.  Genitourinary: Negative for dysuria and hematuria.  Musculoskeletal: Negative for joint pain and myalgias.  Skin: Negative for rash.  Neurological: Positive for weakness. Negative for loss of consciousness.  Psychiatric/Behavioral: Negative for memory loss and substance abuse.    Past Medical History:  Diagnosis Date  . Atrial fibrillation ([place])    questionable, echo 03/23/11 - EF >55%, on warfarin  . Bell's palsy   . 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 Charcot-Marie disease   . 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 Gait disturbance   . Gallstones   . 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 IFG (impaired fasting glucose)    e  . Personal history of other diseases of circulatory system   . Tick bite   . Unspecified hereditary and idiopathic peripheral neuropathy   . Vitamin B12 deficiency   . Vitamin D deficiency     Past Surgical History:  Procedure Laterality Date  . ABDOMINAL HYSTERECTOMY  1984   fibroids  . BREAST SURGERY  1960s  benign breast tumors  . CATARACT EXTRACTION W/ INTRAOCULAR LENS  IMPLANT, BILATERAL    . LAPAROSCOPIC BILATERAL SALPINGO OOPHERECTOMY  2003   ovarian cyst     reports that she has never smoked. She has never used smokeless tobacco. She reports that she does not drink  alcohol and does not use drugs.  Allergies  Allergen Reactions  . Clindamycin Itching and Rash  . Celebrex [Celecoxib] Other (See Comments)    Hypertension, possible TIA  . Statins Other (See Comments)    Muscle aches  . Tape     Patient CAN tolerate [place] Wrap only (NO TAPE!!)  . Tricor [Fenofibrate] Other (See Comments)    Elevated liver enzymes  . Amoxicillin Rash  . Clarithromycin Rash  . Latex Rash    Family History  Problem Relation Age of Onset  . Hypertension Mother   . Coronary artery disease Father        died at 59  . Hypertension Father   . Diabetes Father   . Coronary artery disease Brother        died at 16  . Diabetes Brother   . Hypertension Brother   . Cancer Daughter     Prior to Admission medications   Medication Sig Start Date End Date Taking? Authorizing Provider  apixaban (ELIQUIS) 5 MG TABS tablet TAKE 1 TABLET(5 MG) BY MOUTH TWICE DAILY Patient taking differently: Take 5 mg by mouth 2 (two) times daily. 10/30/19  Yes Croitoru, Mihai, MD  cholecalciferol (VITAMIN D) 1000 units tablet Take 1,000 Units by mouth daily.   Yes [provider]  Cyanocobalamin (VITAMIN B 12 PO) Take 100 mg by mouth daily.    Yes [provider]  metoprolol tartrate (LOPRESSOR) 25 MG tablet Take 0.5 tablets (12.5 mg total) by mouth 2 (two) times daily. 10/11/19 01/09/20 Yes Duke, Tami Lin, PA    Physical Exam:  Constitutional: NAD, calm, comfortable Vitals:   04/05/20 0351 04/05/20 0453 04/05/20 0515 04/05/20 0600  BP: (!) 122/51 133/66 125/65 126/61  Pulse: 70 (!) 108 (!) 118 (!) 103  Resp: 15 (!) 21 20 (!) 21  Temp:  99.8 F (37.7 C)  (!) 103.1 F (39.5 C)  TempSrc:  Oral    SpO2: 95% 95% 94% 93%  Weight:      Height:       Eyes: PERRL, lids and conjunctivae normal ENMT: Mucous membranes are moist. Posterior pharynx clear of any exudate or lesions.Normal dentition.  Neck: normal, supple, no masses, no thyromegaly Respiratory: clear to  auscultation bilaterally, no wheezing, no crackles. Normal respiratory effort. No accessory muscle use.  Cardiovascular: Regular rate and rhythm, no murmurs / rubs / gallops. No extremity edema. 2+ pedal pulses. No carotid bruits.  Abdomen: no tenderness, no masses palpated. No hepatosplenomegaly. Bowel sounds positive.  Musculoskeletal: no clubbing / cyanosis. No joint deformity upper and lower extremities. Good ROM, no contractures. Normal muscle tone.  Skin: no rashes, lesions, ulcers. No induration Neurologic: CN 2-12 grossly intact. Sensation intact, DTR normal. Strength 5/5 in all 4.  Psychiatric: Normal judgment and insight. Alert and oriented x 3. Normal mood.     Labs on Admission: I have personally reviewed following labs and imaging studies  CBC: Recent Labs  Lab 04/04/20 2110  WBC 5.4  HGB 13.3  HCT 38.2  MCV 79.7*  PLT 295*   Basic Metabolic Panel: Recent Labs  Lab 04/04/20 2110  NA 127*  K 3.2*  CL 97*  CO2 21*  GLUCOSE 135*  BUN 8  CREATININE 0.76  CALCIUM 8.3*   GFR: Estimated Creatinine Clearance: 68.6 mL/min (by C-G formula based on SCr of 0.76 mg/dL). Liver Function Tests: Recent Labs  Lab 04/04/20 2110  AST 49*  ALT 36  ALKPHOS 45  BILITOT 1.4*  PROT 5.4*  ALBUMIN 2.9*   Recent Labs  Lab 04/04/20 2110  LIPASE 22   No results for input(s): AMMONIA in the last 168 hours. Coagulation Profile: No results for input(s): INR, PROTIME in the last 168 hours. Cardiac Enzymes: No results for input(s): CKTOTAL, CKMB, CKMBINDEX, TROPONINI in the last 168 hours. BNP (last 3 results) No results for input(s): PROBNP in the last 8760 hours. HbA1C: No results for input(s): HGBA1C in the last 72 hours. CBG: No results for input(s): GLUCAP in the last 168 hours. Lipid Profile: No results for input(s): CHOL, HDL, LDLCALC, TRIG, CHOLHDL, LDLDIRECT in the last 72 hours. Thyroid Function Tests: No results for input(s): TSH, T4TOTAL, FREET4, T3FREE,  THYROIDAB in the last 72 hours. Anemia Panel: No results for input(s): VITAMINB12, FOLATE, FERRITIN, TIBC, IRON, RETICCTPCT in the last 72 hours. Urine analysis:    Component Value Date/Time   COLORURINE AMBER (A) 04/05/2020 0606   APPEARANCEUR HAZY (A) 04/05/2020 0606   LABSPEC 1.018 04/05/2020 0606   PHURINE 5.0 04/05/2020 0606   GLUCOSEU NEGATIVE 04/05/2020 0606   HGBUR SMALL (A) 04/05/2020 0606   BILIRUBINUR NEGATIVE 04/05/2020 0606   KETONESUR 20 (A) 04/05/2020 0606   PROTEINUR 100 (A) 04/05/2020 0606   UROBILINOGEN 0.2 05/13/2009 0821   NITRITE NEGATIVE 04/05/2020 0606   LEUKOCYTESUR NEGATIVE 04/05/2020 0606   Sepsis Labs: No results found for this or any previous visit (from the past 240 hour(s)).   Radiological Exams on Admission: DG Chest Port 1 View  Result Date: 04/05/2020 CLINICAL DATA:  Sob,COVID positive EXAM: PORTABLE CHEST 1 VIEW COMPARISON:  04/05/2019 FINDINGS: Stable cardiac silhouette. Normal increase in mild airspace disease. Mild increased central venous congestion. Small RIGHT effusion. IMPRESSION: Subtle increase in pulmonary airspace disease. Findings concerning for early pneumonia versus pulmonary edema. Electronically Signed   By: Suzy Bouchard M.D.   On: 04/05/2020 07:38    EKG: Independently reviewed.  Atrial fibrillation at 118 bpm with QTC 520  Assessment/Plan Gastroenteritis due to COVID-19: Acute.  Patient presents after husband had tested positive for COVID-19 about 3 to 4 weeks ago.  She presents with complaints of recent diarrhea, weakness, nausea, and poor p.o. intake.  Patient is vaccinated and boosted reportedly.  Chest x-ray was concerning for possible early pneumonia.  She denies any complaints of abdominal pain. -Admit to a medical telemetry bed -Strict intake and output -Check C. difficile and GI panel if diarrhea persist -Normal saline IV fluids at 100 mL/h -Tigan p.o. as needed nausea and vomiting given prolonged QT  interval -Imodium 2 g as needed for loose stools or diarrhea if C. difficile and GI panel are negative -Probiotics -Consider need of further work-up if needed  Sepsis: Acute.  Patient was seen to be febrile up to 103.1 F with tachycardia and mild tachypnea.  Labs revealed WBC of 5.4.  No initial lactic acid was obtained prior to patient receiving at least 1 L of normal saline fluids.  Suspect secondary to above.  Urinalysis did not show clear signs of infection.  However chest x-ray was concerning for possible early developing pneumonia versus edema. -Check blood cultures -Check lactic acid level -Check CRP and procalcitonin -Encourage incentive spirometry  Generalized weakness:  Acute.  Patient reports that she has not had any energy, no appetite, fevers, and diarrhea precipitating symptoms.  Nursing staff report two person assistance needed to stand. -PT/OT to evaluate and treat  Paroxysmal atrial fibrillation on chronic anticoagulation: Patient presented with heart rates elevated up to 130s.  She has been compliant with Eliquis. -Continue metoprolol and Eliquis -Goal potassium 4 and magnesium 2  Hypokalemia: Acute.  Potassium 3.2 on admission.  Suspect secondary to patient's reports of diarrhea. -Give 40 mEq of potassium chloride p.o. -Continue to monitor and replace as needed  Hyponatremia: Acute.  Initial sodium 127 which likely relates with diarrhea as well fever.  Patient had been given 1 L normal saline IV fluids -IV fluids as seen above -Recheck sodium levels in a.m.  Diastolic congestive heart failure: Last EF noted to be 65 to 70% with grade 1 diastolic dysfunction back in 10 of 2019.  Patient appears to be euvolemic at this time. -Strict I&Os and daily weights  Elevated AST and total bilirubin.  Acute.  AST 49 and total bilirubin 1.4.  Question if symptoms related to heart failure versus acute dehydration. -Continue to monitor  Thrombocytopenia: Chronic.  Platelet count  101 which appears near her baseline. -Continue to monitor  Prolonged QT interval: Acute.  QTC 520 on admission. -Avoid QT prolonging medication  Chronic lymphoid leukemia: Initially diagnosed in 2001 and currently followed by Dr. Jana Hakim.  She has not required treatment and has just been under observation at this time. -Continue to monitor  Obesity: BMI 32.84 kg/m   DVT prophylaxis: Eliquis Code Status: Full Family Communication: Husband to be updated over the phone Disposition Plan: Hopefully discharge home in 1 to 2 days Consults called: None Admission status: Inpatient, require more than 2 midnight stay due to severity of weakness   Norval Morton MD [facility]   If 7PM-7AM, please contact night-coverage   04/05/2020, 7:19 AM

## 2020-04-05 NOTE — ED Provider Notes (Signed)
[facility] EMERGENCY DEPARTMENT Provider Note  CSN: [id number] Arrival date & time: 04/04/20 2038  Chief Complaint(s) Nausea and Diarrhea  HPI Tasha [name] is a 80 y.o. female with a past medical history listed below who presents to the emergency department with 3 weeks of nausea for the past several days she has had general fatigue and weakness prompting her today patient denies.  She does report positive sick stating her husband had Covid 3 to 4 weeks ago.  She initially had minor URI symptoms which subsided and were followed by the diarrhea.  Currently she denies any coughing, nasal congestion, vomiting, chest pain, abdominal pain.  She denies any urinary symptoms but does report that her urine is dark related normal.  HPI  Past Medical History Past Medical History:  Diagnosis Date  . Atrial fibrillation ([place])    questionable, echo 03/23/11 - EF >55%, on warfarin  . Bell's palsy   . 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 Charcot-Marie disease   . 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 Gait disturbance   . Gallstones   . 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 IFG (impaired fasting glucose)    e  . Personal history of other diseases of circulatory system   . Tick bite   . Unspecified hereditary and idiopathic peripheral neuropathy   . Vitamin B12 deficiency   . Vitamin D deficiency    Patient Active Problem List   Diagnosis Date Noted  . Paroxysmal atrial fibrillation ([place]) 10/22/2017  . 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 Thrombocytopenia ([place]) 07/19/2017  . Chronic lymphoid leukemia ([place]) 07/14/2017  . 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 Atrial tachycardia ([place]) 03/23/2013  . Vitamin D deficiency   . Vitamin B12 deficiency   . Hereditary and idiopathic peripheral neuropathy   . Gait disturbance    Home Medication(s) Prior to Admission medications   Medication Sig Start Date End Date Taking?  Authorizing Provider  apixaban (ELIQUIS) 5 MG TABS tablet TAKE 1 TABLET(5 MG) BY MOUTH TWICE DAILY Patient taking differently: Take 5 mg by mouth 2 (two) times daily. 10/30/19  Yes Croitoru, Mihai, MD  cholecalciferol (VITAMIN D) 1000 units tablet Take 1,000 Units by mouth daily.   Yes [provider]  Cyanocobalamin (VITAMIN B 12 PO) Take 100 mg by mouth daily.    Yes [provider]  metoprolol tartrate (LOPRESSOR) 25 MG tablet Take 0.5 tablets (12.5 mg total) by mouth 2 (two) times daily. 10/11/19 01/09/20 Yes Duke, Tami Lin, PA                                                                                                                                    Past Surgical History Past Surgical History:  Procedure Laterality Date  . ABDOMINAL HYSTERECTOMY  1984   fibroids  . BREAST SURGERY  1960s   benign breast tumors  .  CATARACT EXTRACTION W/ INTRAOCULAR LENS  IMPLANT, BILATERAL    . LAPAROSCOPIC BILATERAL SALPINGO OOPHERECTOMY  2003   ovarian cyst   Family History Family History  Problem Relation Age of Onset  . Hypertension Mother   . Coronary artery disease Father        died at 93  . Hypertension Father   . Diabetes Father   . Coronary artery disease Brother        died at 37  . Diabetes Brother   . Hypertension Brother   . Cancer Daughter     Social History Social History   Tobacco Use  . Smoking status: Never Smoker  . Smokeless tobacco: Never Used  Substance Use Topics  . Alcohol use: No    Alcohol/week: 0.0 standard drinks  . Drug use: No   Allergies Clindamycin, Celebrex [celecoxib], Statins, Tape, Tricor [fenofibrate], Amoxicillin, Clarithromycin, and Latex  Review of Systems Review of Systems All other systems are reviewed and are negative for acute change except as noted in the HPI  Physical Exam Vital Signs  I have reviewed the triage vital signs BP 126/61   Pulse (!) 103   Temp (!) 103.1 F (39.5 C)   Resp (!) 21   Ht  5\' 7"  (1.702 m)   Wt 95.1 kg   SpO2 93%   BMI 32.84 kg/m   Physical Exam Vitals reviewed.  Constitutional:      General: She is not in acute distress.    Appearance: She is well-developed. She is not diaphoretic.  HENT:     Head: Normocephalic and atraumatic.     Nose: Nose normal.  Eyes:     General: No scleral icterus.       Right eye: No discharge.        Left eye: No discharge.     Conjunctiva/sclera: Conjunctivae normal.     Pupils: Pupils are equal, round, and reactive to light.  Cardiovascular:     Rate and Rhythm: Tachycardia present. Rhythm irregularly irregular.     Heart sounds: No murmur heard. No friction rub. No gallop.   Pulmonary:     Effort: Pulmonary effort is normal. No respiratory distress.     Breath sounds: Normal breath sounds. No stridor. No rales.  Abdominal:     General: There is no distension.     Palpations: Abdomen is soft.     Tenderness: There is no abdominal tenderness.  Musculoskeletal:        General: No tenderness.     Cervical back: Normal range of motion and neck supple.  Skin:    General: Skin is warm and dry.     Findings: No erythema or rash.  Neurological:     Mental Status: She is alert and oriented to person, place, and time.     ED Results and Treatments Labs (all labs ordered are listed, but only abnormal results are displayed) Labs Reviewed  COMPREHENSIVE METABOLIC PANEL - Abnormal; Notable for the following components:      Result Value   Sodium 127 (*)    Potassium 3.2 (*)    Chloride 97 (*)    CO2 21 (*)    Glucose, Bld 135 (*)    Calcium 8.3 (*)    Total Protein 5.4 (*)    Albumin 2.9 (*)    AST 49 (*)    Total Bilirubin 1.4 (*)    All other components within normal limits  CBC - Abnormal; Notable for the following components:  MCV 79.7 (*)    Platelets 101 (*)    All other components within normal limits  URINALYSIS, ROUTINE W REFLEX MICROSCOPIC - Abnormal; Notable for the following components:    Color, Urine AMBER (*)    APPearance HAZY (*)    Hgb urine dipstick SMALL (*)    Ketones, ur 20 (*)    Protein, ur 100 (*)    Bacteria, UA RARE (*)    All other components within normal limits  POC SARS CORONAVIRUS 2 AG -  ED - Abnormal; Notable for the following components:   SARS Coronavirus 2 Ag POSITIVE (*)    All other components within normal limits  LIPASE, BLOOD                                                                                                                         EKG  EKG Interpretation  Date/Time:  Friday April 05 2020 07:26:52 EDT Ventricular Rate:  118 PR Interval:    QRS Duration: 85 QT Interval:  371 QTC Calculation: 520 R Axis:   52 Text Interpretation: Atrial fibrillation Borderline T abnormalities, diffuse leads Prolonged QT interval Confirmed by Addison Lank [phone]) on 04/05/2020 7:33:23 AM      Radiology No results found.  Pertinent labs & imaging results that were available during my care of the patient were reviewed by me and considered in my medical decision making (see chart for details).  Medications Ordered in ED Medications  acetaminophen (TYLENOL) tablet 1,000 mg (1,000 mg Oral Given 04/05/20 0550)  sodium chloride 0.9 % bolus 1,000 mL (1,000 mLs Intravenous New Bag/Given 04/05/20 0552)                                                                                                                                    Procedures .1-3 Lead EKG Interpretation Performed by: Fatima Blank, MD Authorized by: Fatima Blank, MD     Interpretation: abnormal     ECG rate:  115   ECG rate assessment: tachycardic     Rhythm: atrial fibrillation     Ectopy: none     Conduction: normal      (including critical care time)  Medical Decision Making / ED Course I have reviewed the nursing notes for this encounter and the patient's prior records (if available in EHR or on provided paperwork).   Tasha [name] was evaluated  in Emergency Department on 04/05/2020 for the symptoms described in the history of present illness. She was evaluated in the context of the global COVID-19 pandemic, which necessitated consideration that the patient might be at risk for infection with the SARS-CoV-2 virus that causes COVID-19. Institutional protocols and algorithms that pertain to the evaluation of patients at risk for COVID-19 are in a state of rapid change based on information released by regulatory bodies including the CDC and federal and state organizations. These policies and algorithms were followed during the patient's care in the ED.  Patient is febrile and tachycardic.  Labs without leukocytosis or anemia.  She does have some mild electrolyte derangements but no renal insufficiency.  She was tested and positive for Covid.  UA without evidence of infection. Due to her generalized weakness and tachycardia, I feel she would benefit from admission for additional hydration and management.       Final Clinical Impression(s) / ED Diagnoses Final diagnoses:  PBDHD-[id number] virus infection  Hyponatremia  Hypokalemia      This chart was dictated using voice recognition software.  Despite best efforts to proofread,  errors can occur which can change the documentation meaning.   Fatima Blank, MD 04/05/20 [phone]

## 2020-04-06 ENCOUNTER — Inpatient Hospital Stay (HOSPITAL_COMMUNITY): Payer: Medicare Other

## 2020-04-06 ENCOUNTER — Other Ambulatory Visit (HOSPITAL_COMMUNITY): Payer: Medicare Other

## 2020-04-06 DIAGNOSIS — R0603 Acute respiratory distress: Secondary | ICD-10-CM | POA: Diagnosis not present

## 2020-04-06 LAB — COMPREHENSIVE METABOLIC PANEL
ALT: 27 U/L (ref 0–44)
AST: 23 U/L (ref 15–41)
Albumin: 2.3 g/dL — ABNORMAL LOW (ref 3.5–5.0)
Alkaline Phosphatase: 41 U/L (ref 38–126)
Anion gap: 7 (ref 5–15)
BUN: 6 mg/dL — ABNORMAL LOW (ref 8–23)
CO2: 25 mmol/L (ref 22–32)
Calcium: 8.2 mg/dL — ABNORMAL LOW (ref 8.9–10.3)
Chloride: 102 mmol/L (ref 98–111)
Creatinine, Ser: 0.66 mg/dL (ref 0.44–1.00)
GFR, Estimated: >60 mL/min (ref >60)
Glucose, Bld: 135 mg/dL — ABNORMAL HIGH (ref 70–99)
Potassium: 3.9 mmol/L (ref 3.5–5.1)
Sodium: 134 mmol/L — ABNORMAL LOW (ref 135–145)
Total Bilirubin: 0.7 mg/dL (ref 0.3–1.2)
Total Protein: 4.6 g/dL — ABNORMAL LOW (ref 6.5–8.1)

## 2020-04-06 LAB — CBC WITH DIFFERENTIAL/PLATELET
Abs Immature Granulocytes: 0.02 10*3/uL (ref 0.00–0.07)
Basophils Absolute: 0 10*3/uL (ref 0.0–0.1)
Basophils Relative: 0 %
Eosinophils Absolute: 0 10*3/uL (ref 0.0–0.5)
Eosinophils Relative: 1 %
HCT: 35.3 % — ABNORMAL LOW (ref 36.0–46.0)
Hemoglobin: 12.2 g/dL (ref 12.0–15.0)
Immature Granulocytes: 1 %
Lymphocytes Relative: 25 %
Lymphs Abs: 0.7 10*3/uL (ref 0.7–4.0)
MCH: 28.2 pg (ref 26.0–34.0)
MCHC: 34.6 g/dL (ref 30.0–36.0)
MCV: 81.5 fL (ref 80.0–100.0)
Monocytes Absolute: 0.3 10*3/uL (ref 0.1–1.0)
Monocytes Relative: 10 %
Neutro Abs: 1.7 10*3/uL (ref 1.7–7.7)
Neutrophils Relative %: 63 %
Platelets: 77 10*3/uL — ABNORMAL LOW (ref 150–400)
RBC: 4.33 MIL/uL (ref 3.87–5.11)
RDW: 14.6 % (ref 11.5–15.5)
WBC: 2.7 10*3/uL — ABNORMAL LOW (ref 4.0–10.5)
nRBC: 0 % (ref 0.0–0.2)

## 2020-04-06 LAB — MAGNESIUM: Magnesium: 1.9 mg/dL (ref 1.7–2.4)

## 2020-04-06 LAB — PHOSPHORUS: Phosphorus: 2.9 mg/dL (ref 2.5–4.6)

## 2020-04-06 LAB — GLUCOSE, CAPILLARY: Glucose-Capillary: 142 mg/dL — ABNORMAL HIGH (ref 70–99)

## 2020-04-06 LAB — PREALBUMIN: Prealbumin: <5 mg/dL — ABNORMAL LOW (ref 18–38)

## 2020-04-06 MED ORDER — METHYLPREDNISOLONE SODIUM SUCC 40 MG IJ SOLR
40.0000 mg | Freq: Two times a day (BID) | INTRAMUSCULAR | Status: DC
  Administered 2020-04-06 – 2020-04-07 (×4): 40 mg via INTRAVENOUS
  Filled 2020-04-06 (×4): qty 1

## 2020-04-06 MED ORDER — MAGNESIUM SULFATE 2 GM/50ML IV SOLN
2.0000 g | Freq: Once | INTRAVENOUS | Status: AC
  Administered 2020-04-06: 2 g via INTRAVENOUS
  Filled 2020-04-06: qty 50

## 2020-04-06 MED ORDER — POTASSIUM CHLORIDE 20 MEQ PO PACK
40.0000 meq | PACK | Freq: Once | ORAL | Status: AC
  Administered 2020-04-06: 40 meq via ORAL
  Filled 2020-04-06: qty 2

## 2020-04-06 NOTE — Plan of Care (Signed)
  Problem: Education: Goal: Knowledge of General Education information will improve Description: Including pain rating scale, medication(s)/side effects and non-pharmacologic comfort measures Outcome: Progressing   Problem: Health Behavior/Discharge Planning: Goal: Ability to manage health-related needs will improve Outcome: Progressing   Problem: Clinical Measurements: Goal: Ability to maintain clinical measurements within normal limits will improve Outcome: Progressing Goal: Will remain free from infection Outcome: Progressing Goal: Diagnostic test results will improve Outcome: Progressing Goal: Respiratory complications will improve Outcome: Progressing Goal: Cardiovascular complication will be avoided Outcome: Not Progressing Note: Irregular heart rate continues, patient currently in yellow mews.

## 2020-04-06 NOTE — Evaluation (Signed)
Physical Therapy Evaluation Patient Details Name: Tasha [name] MRN: [id number] DOB: Oct 31, 1940 Today's Date: 04/06/2020   History of Present Illness  Pt is a 80 y.o. female admitted 04/04/20 with c/o diarrhea and weakness. CXR concerning for early PNA or edema. Workup for gastroenteritis secondary to COVID-19, sepsis. PMH includes afib, Charcot-Marie disease, peripheral neuropathy, Bell's palsy, Bouchard nodes, R foot drop.    Clinical Impression  Pt presents with an overall decrease in functional mobility secondary to above. PTA, pt mod indep with rollator, lives with husband and grandson. Today, pt able to initiate transfer and gait training with RW, requiring up to [facility] for stability; pt limited by R foot drop (does not have her AFO in room), generalized weakness, decreased activity tolerance and hypotension (see values below; RN aware). Pt reports husband able to provide necessary assist upon return home. Pt would benefit from continued acute PT services to maximize functional mobility and independence prior to d/c with HHPT services.   HR 124-140s SpO2 92% on RA  Orthostatic BPs Supine 97/72  Sitting 128/67  Post-ambulation 103/87  Seated ~3-min 83/70      Follow Up Recommendations Home health PT;Supervision for mobility/OOB    Equipment Recommendations  None recommended by PT    Recommendations for Other Services       Precautions / Restrictions Precautions Precautions: Fall;Other (comment) Precaution Comments: R foot drop, likes her shoes donned, watch BP Restrictions Weight Bearing Restrictions: No      Mobility  Bed Mobility Overal bed mobility: Needs Assistance Bed Mobility: Supine to Sit     Supine to sit: Min assist;HOB elevated     General bed mobility comments: MinA for HHA to elevate trunk; increased time and effort requiring cues to complete task    Transfers Overall transfer level: Needs assistance Equipment used: Rolling walker (2  wheeled) Transfers: Sit to/from Stand Sit to Stand: Mod assist;+2 safety/equipment         General transfer comment: Increased time and effort preparing to stand; cues for hand placement as pt typically pulls up on her rollator; modA to elevate trunk and maintain balance  Ambulation/Gait Ambulation/Gait assistance: Min assist;Mod assist;+2 safety/equipment Gait Distance (Feet): 16 Feet Assistive device: Rolling walker (2 wheeled) Gait Pattern/deviations: Decreased stride length;Step-to pattern;Decreased dorsiflexion - right;Trunk flexed;Leaning posteriorly Gait velocity: Decreased   General Gait Details: Slow, labored, unsteady gait with RW and frequent min-modA to maintain balance; significant increased time and effort, difficulty with RW navigation around objects, difficulty with R foot drop; intermittent unsteadiness requiring assist to correct  Stairs            Wheelchair Mobility    Modified Rankin (Stroke Patients Only)       Balance Overall balance assessment: Needs assistance   Sitting balance-Leahy Scale: Fair Sitting balance - Comments: initially poor with posterior lean and UE support, progressed to fair   Standing balance support: Bilateral upper extremity supported Standing balance-Leahy Scale: Poor Standing balance comment: reliant on B UE support of walker and min external assist                             Pertinent Vitals/Pain Pain Assessment: No/denies pain    Home Living Family/patient expects to be discharged to:: Private residence Living Arrangements: Spouse/significant other;Other relatives Available Help at Discharge: Family;Available PRN/intermittently Type of Home: House Home Access: Ramped entrance     Home Layout: One level Home Equipment: Grab bars - tub/shower;Hand held shower  head;Bedside commode;Cane - single point;Walker - 4 wheels Additional Comments: Lives with husband (works as Furniture conservator/restorer) and 37 y.o.  grandson    Prior Function Level of Independence: Independent with assistive device(s)         Comments: Mod indep with rollator. Performs household tasks and ambulation. Wears AFO for R foot drop     Hand Dominance   Dominant Hand: Right    Extremity/Trunk Assessment   Upper Extremity Assessment Upper Extremity Assessment: Generalized weakness    Lower Extremity Assessment Lower Extremity Assessment: Generalized weakness;RLE deficits/detail RLE Deficits / Details: R foot drop RLE Sensation: history of peripheral neuropathy;decreased light touch LLE Sensation: decreased light touch;history of peripheral neuropathy       Communication   Communication: No difficulties (low volume)  Cognition Arousal/Alertness: Awake/alert Behavior During Therapy: WFL for tasks assessed/performed Overall Cognitive Status: Impaired/Different from baseline Area of Impairment: Problem solving                             Problem Solving: Slow processing;Decreased initiation        General Comments General comments (skin integrity, edema, etc.): SpO2 92% on RA; HR 124-140s. Supine BP 97/72, sitting BP 128/67, post-ambulation BP 103/87, seated ~3-min BP 83/70; pt c/o nausea; RN updated of vitals    Exercises     Assessment/Plan    PT Assessment Patient needs continued PT services  PT Problem List Decreased strength;Decreased activity tolerance;Decreased balance;Decreased mobility;Decreased cognition;Cardiopulmonary status limiting activity       PT Treatment Interventions DME instruction;Gait training;Stair training;Functional mobility training;Therapeutic activities;Therapeutic exercise;Balance training;Patient/family education    PT Goals (Current goals can be found in the Care Plan section)  Acute Rehab PT Goals Patient Stated Goal: return home with husband's assist PT Goal Formulation: With patient Time For Goal Achievement: 04/20/20 Potential to Achieve Goals:  Good    Frequency Min 3X/week   Barriers to discharge Inaccessible home environment      Co-evaluation PT/OT/SLP Co-Evaluation/Treatment: Yes Reason for Co-Treatment: For patient/therapist safety;To address functional/ADL transfers PT goals addressed during session: Mobility/safety with mobility;Balance;Proper use of DME OT goals addressed during session: ADL's and self-care       AM-PAC PT "6 Clicks" Mobility  Outcome Measure Help needed turning from your back to your side while in a flat bed without using bedrails?: A Little Help needed moving from lying on your back to sitting on the side of a flat bed without using bedrails?: A Little Help needed moving to and from a bed to a chair (including a wheelchair)?: A Lot Help needed standing up from a chair using your arms (e.g., wheelchair or bedside chair)?: A Lot Help needed to walk in hospital room?: A Lot Help needed climbing 3-5 steps with a railing? : A Lot 6 Click Score: 14    End of Session   Activity Tolerance: Patient limited by fatigue;Treatment limited secondary to medical complications (Comment) Patient left: in chair;with call bell/phone within reach Nurse Communication: Mobility status;Other (comment) (hypotension; RN ok with no chair alarm) PT Visit Diagnosis: Other abnormalities of gait and mobility (R26.89);Muscle weakness (generalized) (M62.81)    Time: 0762-2633 PT Time Calculation (min) (ACUTE ONLY): 27 min   Charges:   PT Evaluation $PT Eval Moderate Complexity: Atkins, PT, DPT Acute Rehabilitation Services  Pager [phone] Office [place] [address] [place] 04/06/2020, 12:36 PM

## 2020-04-06 NOTE — Evaluation (Addendum)
Occupational Therapy Evaluation Patient Details Name: Tasha [name] MRN: [id number] DOB: [birth year] Today's Date: 04/06/2020    History of Present Illness Pt is a 80 y.o. female admitted 04/04/20 with c/o diarrhea and weakness. CXR concerning for early PNA or edema. Workup for gastroenteritis secondary to COVID-19, sepsis. PMH includes afib, Charcot-Marie disease, peripheral neuropathy, Bell's palsy, Bouchard nodes.   Clinical Impression   Pt was ambulating with a rollator and R AFO prior to admission. She was functioning modified independently in self care and meal prep. Her 9 year old grandson was helpful for IADL. Pt presents with generalized weakness, poor activity tolerance and impaired standing balance. HR 124-140s, supine BP 97/72; SpO2 92% on RA sitting 128/67, 103/87 post-walk (nauseous), 83/70 . She requires set up to max assist for ADL, mod assist to stand and min assist with RW and second person for safety to ambulate a short distance within her room. Will follow acutely with discharge plan for home with initial 24 hour care of her family.     Follow Up Recommendations  Home health OT;Supervision/Assistance - 24 hour    Equipment Recommendations  None recommended by OT R 124-140s, supine BP 97/72; SpO2 92% on RA sitting 128/67, 103/87 post-walk (nauseous), 83/7   Recommendations for Other Services       Precautions / Restrictions Precautions Precautions: Fall Precaution Comments: R foot drop, likes her shoes donned, watch BP Restrictions Weight Bearing Restrictions: No      Mobility Bed Mobility Overal bed mobility: Needs Assistance Bed Mobility: Supine to Sit     Supine to sit: Min assist;HOB elevated     General bed mobility comments: used PT's hand to help elevate trunk, increased time and effort, HOB up    Transfers Overall transfer level: Needs assistance Equipment used: Rolling walker (2 wheeled) Transfers: Sit to/from Stand Sit to Stand: Mod assist;+2  safety/equipment         General transfer comment: assist to rise and steady from elevated bed, pt typically pulls up on her rollator    Balance Overall balance assessment: Needs assistance   Sitting balance-Leahy Scale: Fair Sitting balance - Comments: initially poor with posterior lean, progressed to fair   Standing balance support: Bilateral upper extremity supported Standing balance-Leahy Scale: Poor Standing balance comment: reliant on B UE support of walker and min external assist                           ADL either performed or assessed with clinical judgement   ADL Overall ADL's : Needs assistance/impaired Eating/Feeding: Independent;Bed level   Grooming: Oral care;Sitting;Set up   Upper Body Bathing: Minimal assistance;Sitting   Lower Body Bathing: Maximal assistance;Sit to/from stand   Upper Body Dressing : Minimal assistance;Sitting   Lower Body Dressing: Maximal assistance;Sit to/from stand   Toilet Transfer: Minimal assistance;+2 for safety/equipment;Ambulation;RW   Toileting- Clothing Manipulation and Hygiene: Minimal assistance;Sit to/from stand       Functional mobility during ADLs: Minimal assistance;+2 for safety/equipment;Rolling walker;Cueing for sequencing;Cueing for safety General ADL Comments: pt fatigues easily     Vision Baseline Vision/History: Wears glasses Wears Glasses: At all times Patient Visual Report: No change from baseline       Perception     Praxis      Pertinent Vitals/Pain Pain Assessment: No/denies pain     Hand Dominance Right   Extremity/Trunk Assessment Upper Extremity Assessment Upper Extremity Assessment: Overall WFL for tasks assessed;Generalized weakness   Lower  Extremity Assessment Lower Extremity Assessment: Defer to PT evaluation RLE Deficits / Details: R foot drop RLE Sensation: history of peripheral neuropathy;decreased light touch LLE Sensation: decreased light touch;history of  peripheral neuropathy       Communication Communication Communication: No difficulties (low volume)   Cognition Arousal/Alertness: Awake/alert Behavior During Therapy: WFL for tasks assessed/performed Overall Cognitive Status: Impaired/Different from baseline Area of Impairment: Problem solving                             Problem Solving: Slow processing;Decreased initiation     General Comments       Exercises     Shoulder Instructions      Home Living Family/patient expects to be discharged to:: Private residence Living Arrangements: Spouse/significant other;Other relatives (17 year old grandson) Available Help at Discharge: Family;Available PRN/intermittently Type of Home: House Home Access: Ramped entrance     Home Layout: One level     Bathroom Shower/Tub: Teacher, early years/pre: Handicapped height     Home Equipment: Grab bars - tub/shower;Hand held shower head;Bedside commode;Cane - single point;Walker - 4 wheels   Additional Comments: Lives with husband and grandson      Prior Functioning/Environment Level of Independence: Independent with assistive device(s)        Comments: Mod indep with rollator. Performs household tasks and ambulation. Wears AFO for R foot drop        OT Problem List: Decreased strength;Decreased activity tolerance;Impaired balance (sitting and/or standing);Decreased cognition;Decreased knowledge of use of DME or AE;Obesity      OT Treatment/Interventions: Self-care/ADL training;DME and/or AE instruction;Energy conservation;Therapeutic activities;Cognitive remediation/compensation;Balance training;Patient/family education    OT Goals(Current goals can be found in the care plan section) Acute Rehab OT Goals Patient Stated Goal: return home OT Goal Formulation: With patient Time For Goal Achievement: 04/20/20 Potential to Achieve Goals: Good ADL Goals Pt Will Perform Grooming: with min guard  assist;standing Pt Will Perform Lower Body Bathing: with min assist;sit to/from stand Pt Will Perform Lower Body Dressing: with min assist;sit to/from stand Pt Will Transfer to Toilet: with min guard assist;ambulating;bedside commode (over toilet) Pt Will Perform Toileting - Clothing Manipulation and hygiene: with min guard assist;sit to/from stand Additional ADL Goal #1: Pt will employ energy conservation strategies in ADL with minimal verbal cues. Additional ADL Goal #2: Pt will perform bed mobility modified independently in preparation for ADL.  OT Frequency: Min 2X/week   Barriers to D/C:            Co-evaluation PT/OT/SLP Co-Evaluation/Treatment: Yes Reason for Co-Treatment: For patient/therapist safety   OT goals addressed during session: ADL's and self-care      AM-PAC OT "6 Clicks" Daily Activity     Outcome Measure Help from another person eating meals?: None Help from another person taking care of personal grooming?: A Little Help from another person toileting, which includes using toliet, bedpan, or urinal?: A Little Help from another person bathing (including washing, rinsing, drying)?: A Lot Help from another person to put on and taking off regular upper body clothing?: A Little Help from another person to put on and taking off regular lower body clothing?: A Lot 6 Click Score: 17   End of Session Equipment Utilized During Treatment: Rolling walker Nurse Communication: Mobility status  Activity Tolerance: Patient limited by fatigue Patient left: in chair;with call bell/phone within reach  OT Visit Diagnosis: Unsteadiness on feet (R26.81);Other abnormalities of gait and mobility (R26.89);Muscle  weakness (generalized) (M62.81);Other symptoms and signs involving cognitive function                Time: 5631-4970 OT Time Calculation (min): 27 min Charges:  OT General Charges $OT Visit: 1 Visit OT Evaluation $OT Eval Moderate Complexity: 1 Mod  Nestor Lewandowsky,  OTR/L Acute Rehabilitation Services Pager: [phone] Office: [phone] Malka So 04/06/2020, 10:34 AM

## 2020-04-06 NOTE — Progress Notes (Signed)
  Echocardiogram 2D Echocardiogram has been performed.  Merrie Roof F 04/06/2020, 5:23 PM

## 2020-04-06 NOTE — Plan of Care (Signed)
  Problem: Education: Goal: Knowledge of General Education information will improve Description: Including pain rating scale, medication(s)/side effects and non-pharmacologic comfort measures Outcome: Progressing   Problem: Health Behavior/Discharge Planning: Goal: Ability to manage health-related needs will improve Outcome: Progressing   Problem: Clinical Measurements: Goal: Ability to maintain clinical measurements within normal limits will improve Outcome: Progressing Goal: Will remain free from infection Outcome: Progressing Goal: Diagnostic test results will improve Outcome: Progressing Goal: Respiratory complications will improve Outcome: Progressing Goal: Cardiovascular complication will be avoided Outcome: Progressing   Problem: Activity: Goal: Risk for activity intolerance will decrease Outcome: Progressing   Problem: Nutrition: Goal: Adequate nutrition will be maintained Outcome: Progressing   Problem: Coping: Goal: Level of anxiety will decrease Outcome: Progressing   Problem: Elimination: Goal: Will not experience complications related to bowel motility Outcome: Progressing Goal: Will not experience complications related to urinary retention Outcome: Progressing   Problem: Pain Managment: Goal: General experience of comfort will improve Outcome: Progressing   Problem: Safety: Goal: Ability to remain free from injury will improve Outcome: Progressing   Problem: Skin Integrity: Goal: Risk for impaired skin integrity will decrease Outcome: Progressing   Problem: Education: Goal: Knowledge of risk factors and measures for prevention of condition will improve Outcome: Progressing   Problem: Coping: Goal: Psychosocial and spiritual needs will be supported Outcome: Progressing   Problem: Respiratory: Goal: Will maintain a patent airway Outcome: Progressing Goal: Complications related to the disease process, condition or treatment will be avoided or  minimized Outcome: Not Progressing Note: Patient contiues to have frequent stools.

## 2020-04-06 NOTE — Progress Notes (Addendum)
PROGRESS NOTE                                                                                                                                                                                                             Patient Demographics:    Tasha [name], is a 80 y.o. female, DOB - 1940/09/29, [id number]  Outpatient Primary MD for the patient is Lavone Orn, MD   Admit date - 04/04/2020   LOS - 1  Chief Complaint  Patient presents with  . Nausea  . Diarrhea       Brief Narrative: Patient is a 80 y.o. female with PMHx of multiple sclerosis, CLL, PAF-who presented with weakness and diarrhea-she was found to have COVID-19 infection.    Her husband had COVID-19 infection approximately 3 weeks ago-patient started having symptoms then-which mostly involved mild URI and diarrhea.  COVID-19 vaccinated status: Vaccinated  Significant Events: 3/18>> Admit to [facility] for Covid 19 infection/diarrhea/weakness  Significant studies: 3/18>>Chest x-ray: Bilateral pulmonary infiltrates.  COVID-19 medications: Steroids: Remdesivir: Baricitinib:  Antibiotics: None  Microbiology data: 3/18 >>blood culture: No growth  Procedures: None  Consults: None  DVT prophylaxis:  apixaban (ELIQUIS) tablet 5 mg     Subjective:    Teena Dunk today feels better-no diarrhea since admission.  Still weak.  Denies any chest pain or cough or shortness of breath.   Assessment  & Plan :   COVID-19 infection with gastroenteritis and possible pneumonia: Her GI symptoms are better-no further diarrhea-she has bilateral infiltrates on her chest x-ray-she has no signs of volume overload-CRP significantly elevated-however she is not hypoxic-and claims that she does not have shortness of breath.  Apparently symptoms ongoing for approximately 3 weeks.  Given duration of her symptoms-doubt any benefit for Remdesivir-however suspect she may  benefit from steroids given elevated CRP-and significant infiltrates in her lungs.  Her BNP is also elevated but she does not have any signs of volume overload-therefore we will hold Lasix for today-and reassess her volume status tomorrow.  Starting  IV steroids and see how she does.  Sepsis: Likely due to gastroenteritis/COVID-19 infection-looks well-cultures pending.  Sepsis physiology has improved-afebrile this morning.  Hyponatremia: Improved-due to fluid loss.  Hypokalemia: Repleted-follow  PAF: Continue telemetry monitoring-on metoprolol and Eliquis.  Thrombocytopenia: Chronic-but could have worsened further due to COVID-19 infection-follow.  Leukopenia: Due to  COVID-19 infection-follow.  Mildly prolonged QTC: Replete K/Mg-repeat twelve-lead EKG in a.m.  Orthostatic hypotension: Repeat in a.m.-has been well hydrated.  History of multiple sclerosis-at baseline ambulatory with a walker.  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  Obesity: Estimated body mass index is 33.45 kg/m as calculated from the following:   Height as of this encounter: 5\' 6"  (1.676 m).   Weight as of this encounter: 94 kg.    ABG: No results found for: PHART, PCO2ART, PO2ART, HCO3, TCO2, ACIDBASEDEF, O2SAT  Vent Settings: N/A  Condition - Stable  Family Communication  :  Daughter at bedside.  Code Status :  Full Code  Diet :  Diet Order            Diet Heart Room service appropriate? Yes; Fluid consistency: Thin  Diet effective now                  Disposition Plan  :   Status is: Inpatient  Remains inpatient appropriate because:Inpatient level of care appropriate due to severity of illness   Dispo: The patient is from: Home              Anticipated d/c is to: Home              Patient currently is not medically stable to d/c.   Difficult to place patient No    Barriers to discharge: COVID-19 pneumonia-on IV steroids-awaiting further  work-up-including cultures.  Antimicorbials  :    Anti-infectives (From admission, onward)   None      Inpatient Medications  Scheduled Meds: . albuterol  2 puff Inhalation Q6H  . apixaban  5 mg Oral BID  . vitamin C  500 mg Oral Daily  . famotidine  20 mg Oral BID  . feeding supplement  237 mL Oral BID BM  . metoprolol tartrate  12.5 mg Oral BID  . saccharomyces boulardii  250 mg Oral BID  . zinc sulfate  220 mg Oral Daily   Continuous Infusions: PRN Meds:.acetaminophen, loperamide, metoprolol tartrate, trimethobenzamide   Time Spent in minutes  25  See all Orders from today for further details   Oren Binet M.D on 04/06/2020 at 10:21 AM  To page go to [email or web address] - use universal password  [facility] -  Office  [phone]    Objective:   Vitals:   04/06/20 0000 04/06/20 0403 04/06/20 0800 04/06/20 0853  BP: 114/75 125/73 (!) 103/58 97/72  Pulse: (!) 107 (!) 106 99 (!) 114  Resp: (!) 21 (!) 23 20   Temp: 98.9 F (37.2 C) 98.7 F (37.1 C) 98.6 F (37 C)   TempSrc: Axillary Axillary Axillary   SpO2: 91% 92% 93%   Weight:  94 kg    Height:        Wt Readings from Last 3 Encounters:  04/06/20 94 kg  11/27/19 95.1 kg  10/11/19 95.6 kg     Intake/Output Summary (Last 24 hours) at 04/06/2020 1021 Last data filed at 04/06/2020 0859 Gross per 24 hour  Intake -  Output 500 ml  Net -500 ml     Physical Exam Gen Exam:Alert awake-not in any distress HEENT:atraumatic, normocephalic Chest: B/L clear to auscultation anteriorly CVS:S1S2 regular Abdomen:soft non tender, non distended Extremities:no edema Neurology: Non focal Skin: no rash   Data Review:    CBC Recent Labs  Lab 04/04/20 2110 04/06/20 0241  WBC 5.4 2.7*  HGB 13.3 12.2  HCT 38.2 35.3*  PLT 101* 77*  MCV 79.7* 81.5  MCH 27.8 28.2  MCHC 34.8 34.6  RDW 14.3 14.6  LYMPHSABS  --  0.7  MONOABS  --  0.3  EOSABS  --  0.0  BASOSABS  --  0.0    Chemistries  Recent  Labs  Lab 04/04/20 2110 04/06/20 0241  NA 127* 134*  K 3.2* 3.9  CL 97* 102  CO2 21* 25  GLUCOSE 135* 135*  BUN 8 6*  CREATININE 0.76 0.66  CALCIUM 8.3* 8.2*  MG  --  1.9  AST 49* 23  ALT 36 27  ALKPHOS 45 41  BILITOT 1.4* 0.7   ------------------------------------------------------------------------------------------------------------------ No results for input(s): CHOL, HDL, LDLCALC, TRIG, CHOLHDL, LDLDIRECT in the last 72 hours.  No results found for: HGBA1C ------------------------------------------------------------------------------------------------------------------ No results for input(s): TSH, T4TOTAL, T3FREE, THYROIDAB in the last 72 hours.  Invalid input(s): FREET3 ------------------------------------------------------------------------------------------------------------------ No results for input(s): VITAMINB12, FOLATE, FERRITIN, TIBC, IRON, RETICCTPCT in the last 72 hours.  Coagulation profile No results for input(s): INR, PROTIME in the last 168 hours.  No results for input(s): DDIMER in the last 72 hours.  Cardiac Enzymes No results for input(s): CKMB, TROPONINI, MYOGLOBIN in the last 168 hours.  Invalid input(s): CK ------------------------------------------------------------------------------------------------------------------    Component Value Date/Time   BNP 282.2 (H) 04/05/2020 7482    Micro Results No results found for this or any previous visit (from the past 240 hour(s)).  Radiology Reports DG Chest Port 1 View  Result Date: 04/05/2020 CLINICAL DATA:  Sob,COVID positive EXAM: PORTABLE CHEST 1 VIEW COMPARISON:  04/05/2019 FINDINGS: Stable cardiac silhouette. Normal increase in mild airspace disease. Mild increased central venous congestion. Small RIGHT effusion. IMPRESSION: Subtle increase in pulmonary airspace disease. Findings concerning for early pneumonia versus pulmonary edema. Electronically Signed   By: Suzy Bouchard M.D.   On:  04/05/2020 07:38

## 2020-04-07 ENCOUNTER — Inpatient Hospital Stay (HOSPITAL_COMMUNITY): Payer: Medicare Other

## 2020-04-07 LAB — COMPREHENSIVE METABOLIC PANEL
ALT: 26 U/L (ref 0–44)
AST: 18 U/L (ref 15–41)
Albumin: 2.3 g/dL — ABNORMAL LOW (ref 3.5–5.0)
Alkaline Phosphatase: 40 U/L (ref 38–126)
Anion gap: 9 (ref 5–15)
BUN: 11 mg/dL (ref 8–23)
CO2: 22 mmol/L (ref 22–32)
Calcium: 8.5 mg/dL — ABNORMAL LOW (ref 8.9–10.3)
Chloride: 102 mmol/L (ref 98–111)
Creatinine, Ser: 0.58 mg/dL (ref 0.44–1.00)
GFR, Estimated: >60 mL/min (ref >60)
Glucose, Bld: 225 mg/dL — ABNORMAL HIGH (ref 70–99)
Potassium: 4 mmol/L (ref 3.5–5.1)
Sodium: 133 mmol/L — ABNORMAL LOW (ref 135–145)
Total Bilirubin: 0.6 mg/dL (ref 0.3–1.2)
Total Protein: 4.9 g/dL — ABNORMAL LOW (ref 6.5–8.1)

## 2020-04-07 LAB — D-DIMER, QUANTITATIVE: D-Dimer, Quant: 1.54 ug/mL-FEU — ABNORMAL HIGH (ref 0.00–0.50)

## 2020-04-07 LAB — CBC
HCT: 37.1 % (ref 36.0–46.0)
Hemoglobin: 12.7 g/dL (ref 12.0–15.0)
MCH: 27.7 pg (ref 26.0–34.0)
MCHC: 34.2 g/dL (ref 30.0–36.0)
MCV: 81 fL (ref 80.0–100.0)
Platelets: 78 10*3/uL — ABNORMAL LOW (ref 150–400)
RBC: 4.58 MIL/uL (ref 3.87–5.11)
RDW: 14.9 % (ref 11.5–15.5)
WBC: 1.7 10*3/uL — ABNORMAL LOW (ref 4.0–10.5)
nRBC: 0 % (ref 0.0–0.2)

## 2020-04-07 LAB — C-REACTIVE PROTEIN: CRP: 12 mg/dL — ABNORMAL HIGH (ref <1.0)

## 2020-04-07 LAB — ECHOCARDIOGRAM COMPLETE
Height: 66 in
Weight: 3315.72 oz

## 2020-04-07 LAB — PHOSPHORUS: Phosphorus: 3.1 mg/dL (ref 2.5–4.6)

## 2020-04-07 LAB — CALCIUM, IONIZED: Calcium, Ionized, Serum: 4.6 mg/dL (ref 4.5–5.6)

## 2020-04-07 LAB — MAGNESIUM: Magnesium: 2.4 mg/dL (ref 1.7–2.4)

## 2020-04-07 LAB — TSH: TSH: 0.121 u[IU]/mL — ABNORMAL LOW (ref 0.350–4.500)

## 2020-04-07 LAB — T4, FREE: Free T4: 1.36 ng/dL — ABNORMAL HIGH (ref 0.61–1.12)

## 2020-04-07 LAB — PROCALCITONIN: Procalcitonin: 1.01 ng/mL

## 2020-04-07 MED ORDER — LEVOFLOXACIN IN D5W 750 MG/150ML IV SOLN
750.0000 mg | INTRAVENOUS | Status: DC
  Administered 2020-04-07 – 2020-04-10 (×4): 750 mg via INTRAVENOUS
  Filled 2020-04-07 (×5): qty 150

## 2020-04-07 MED ORDER — METOPROLOL TARTRATE 12.5 MG HALF TABLET
12.5000 mg | ORAL_TABLET | Freq: Once | ORAL | Status: AC
  Administered 2020-04-07: 12.5 mg via ORAL
  Filled 2020-04-07: qty 1

## 2020-04-07 MED ORDER — SODIUM CHLORIDE 0.9 % IV BOLUS
250.0000 mL | Freq: Once | INTRAVENOUS | Status: AC
  Administered 2020-04-07: 250 mL via INTRAVENOUS

## 2020-04-07 MED ORDER — METOPROLOL TARTRATE 25 MG PO TABS
25.0000 mg | ORAL_TABLET | Freq: Two times a day (BID) | ORAL | Status: DC
  Administered 2020-04-07: 25 mg via ORAL
  Filled 2020-04-07: qty 1

## 2020-04-07 MED ORDER — ALBUTEROL SULFATE HFA 108 (90 BASE) MCG/ACT IN AERS
2.0000 | INHALATION_SPRAY | Freq: Four times a day (QID) | RESPIRATORY_TRACT | Status: DC | PRN
  Administered 2020-04-07 – 2020-04-08 (×2): 2 via RESPIRATORY_TRACT

## 2020-04-07 MED ORDER — METOPROLOL TARTRATE 5 MG/5ML IV SOLN
5.0000 mg | Freq: Four times a day (QID) | INTRAVENOUS | Status: DC | PRN
  Administered 2020-04-08 – 2020-04-10 (×3): 5 mg via INTRAVENOUS
  Filled 2020-04-07 (×4): qty 5

## 2020-04-07 NOTE — Progress Notes (Addendum)
PROGRESS NOTE                                                                                                                                                                                                             Patient Demographics:    Tasha [name], is a 80 y.o. female, DOB - 05-27-1940, DTO:[id number]  Outpatient Primary MD for the patient is Lavone Orn, MD   Admit date - 04/04/2020   LOS - 2  Chief Complaint  Patient presents with  . Nausea  . Diarrhea       Brief Narrative: Patient is a 80 y.o. female with PMHx of multiple sclerosis, CLL, PAF-who presented with weakness and diarrhea-she was found to have COVID-19 infection.    Her husband had COVID-19 infection approximately 3 weeks ago-patient started having symptoms then-which mostly involved mild URI and diarrhea.  COVID-19 vaccinated status: Vaccinated  Significant Events: 3/18>> Admit to [facility] for Covid 19 infection/diarrhea/weakness  Significant studies: 3/18>>Chest x-ray: Bilateral pulmonary infiltrates.  COVID-19 medications: Steroids:3/20  Antibiotics: None  Microbiology data: 3/18 >>blood culture: No growth  Procedures: None  Consults: None  DVT prophylaxis:  apixaban (ELIQUIS) tablet 5 mg     Subjective:   Feels better-however was hypothermic last night-and now in A. fib with RVR.  But denies any chest pain or shortness of breath.   Assessment  & Plan :   Gastroenteritis: Has resolved-no diarrhea since admission-stool GI pathogen panel/C. difficile PCR never sent.  Doubt any utility of any stool studies at this point.  Pneumonia: Unclear whether this is COVID-19 related pneumonitis/inflammation or superimposed bacterial pneumonia.  Procalcitonin elevated today-started on Levaquin by night coverage-continue steroids.  Follow cultures.  Sepsis: Due to PNA/gastroenteritis-cultures negative so far-sepsis physiology has  significantly improved-she has very mild hypothermia.  PAF with RVR: RVR this morning-given IV metoprolol with some improvement-patient very reluctant to change dosage of oral metoprolol-we will go ahead and change it to 25 mg twice daily-if she continues to have RVR-we will need to reach out to her primary cardiologist.  Remains on anticoagulation with Eliquis.  TSH suppressed-checking free T4.  Leukopenia/thrombocytopenia: Probably due to COVID-19 infection-however has CLL-watch closely.    Hyponatremia: Improved-and now very mild.  Etiology due to fluid loss due to diarrhea.  Follow.    Mildly prolonged QTC: Improved-K/Mg stable.  Orthostatic hypotension: Repeat in a.m.-has been well hydrated.  History of multiple sclerosis-at baseline ambulatory with a walker.  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  Obesity: Estimated body mass index is 33.23 kg/m as calculated from the following:   Height as of this encounter: 5\' 6"  (1.676 m).   Weight as of this encounter: 93.4 kg.    ABG: No results found for: PHART, PCO2ART, PO2ART, HCO3, TCO2, ACIDBASEDEF, O2SAT  Vent Settings: N/A  Condition - Stable  Family Communication  :Daughter-Tracy-[phone] updated on 3/20  Code Status :  Full Code  Diet :  Diet Order            Diet Heart Room service appropriate? Yes; Fluid consistency: Thin  Diet effective now                  Disposition Plan  :   Status is: Inpatient  Remains inpatient appropriate because:Inpatient level of care appropriate due to severity of illness   Dispo: The patient is from: Home              Anticipated d/c is to: Home              Patient currently is not medically stable to d/c.   Difficult to place patient No    Barriers to discharge: Pneumonia-on IV steroids/IV antibiotics-now with A. fib with RVR.  Antimicorbials  :    Anti-infectives (From admission, onward)   Start     Dose/Rate Route Frequency  Ordered Stop   04/07/20 0800  levofloxacin (LEVAQUIN) IVPB 750 mg        750 mg 100 mL/hr over 90 Minutes Intravenous Every 24 hours 04/07/20 0650        Inpatient Medications  Scheduled Meds: . albuterol  2 puff Inhalation Q6H  . apixaban  5 mg Oral BID  . vitamin C  500 mg Oral Daily  . famotidine  20 mg Oral BID  . feeding supplement  237 mL Oral BID BM  . methylPREDNISolone (SOLU-MEDROL) injection  40 mg Intravenous Q12H  . metoprolol tartrate  12.5 mg Oral BID  . saccharomyces boulardii  250 mg Oral BID  . zinc sulfate  220 mg Oral Daily   Continuous Infusions: . levofloxacin (LEVAQUIN) IV 750 mg (04/07/20 0748)   PRN Meds:.acetaminophen, loperamide, metoprolol tartrate, trimethobenzamide   Time Spent in minutes  25  See all Orders from today for further details   Oren Binet M.D on 04/07/2020 at 11:11 AM  To page go to [email or web address] - use universal password  [facility] -  Office  [phone]    Objective:   Vitals:   04/07/20 0650 04/07/20 0854 04/07/20 0934 04/07/20 1039  BP:  (!) 99/58 92/74 103/64  Pulse:      Resp:  20 20 (!) 21  Temp: (!) 96.4 F (35.8 C) (!) 96.4 F (35.8 C) (!) 96.3 F (35.7 C) (!) 96 F (35.6 C)  TempSrc: Oral Rectal Oral Oral  SpO2:  95% 94% 95%  Weight:      Height:        Wt Readings from Last 3 Encounters:  04/07/20 93.4 kg  11/27/19 95.1 kg  10/11/19 95.6 kg     Intake/Output Summary (Last 24 hours) at 04/07/2020 1111 Last data filed at 04/07/2020 0531 Gross per 24 hour  Intake 230 ml  Output 1100 ml  Net -870 ml     Physical Exam Gen Exam:Alert awake-not in any distress  HEENT:atraumatic, normocephalic Chest: B/L clear to auscultation anteriorly CVS:S1S2 regular Abdomen:soft non tender, non distended Extremities:no edema Neurology: Non focal-but has generalized weakness. Skin: no rash   Data Review:    CBC Recent Labs  Lab 04/04/20 2110 04/06/20 0241 04/07/20 0228  WBC 5.4 2.7* 1.7*   HGB 13.3 12.2 12.7  HCT 38.2 35.3* 37.1  PLT 101* 77* 78*  MCV 79.7* 81.5 81.0  MCH 27.8 28.2 27.7  MCHC 34.8 34.6 34.2  RDW 14.3 14.6 14.9  LYMPHSABS  --  0.7  --   MONOABS  --  0.3  --   EOSABS  --  0.0  --   BASOSABS  --  0.0  --     Chemistries  Recent Labs  Lab 04/04/20 2110 04/06/20 0241 04/07/20 0228  NA 127* 134* 133*  K 3.2* 3.9 4.0  CL 97* 102 102  CO2 21* 25 22  GLUCOSE 135* 135* 225*  BUN 8 6* 11  CREATININE 0.76 0.66 0.58  CALCIUM 8.3* 8.2* 8.5*  MG  --  1.9 2.4  AST 49* 23 18  ALT 36 27 26  ALKPHOS 45 41 40  BILITOT 1.4* 0.7 0.6   ------------------------------------------------------------------------------------------------------------------ No results for input(s): CHOL, HDL, LDLCALC, TRIG, CHOLHDL, LDLDIRECT in the last 72 hours.  No results found for: HGBA1C ------------------------------------------------------------------------------------------------------------------ Recent Labs    04/07/20 0314  TSH 0.121*   ------------------------------------------------------------------------------------------------------------------ No results for input(s): VITAMINB12, FOLATE, FERRITIN, TIBC, IRON, RETICCTPCT in the last 72 hours.  Coagulation profile No results for input(s): INR, PROTIME in the last 168 hours.  Recent Labs    04/07/20 0228  DDIMER 1.54*    Cardiac Enzymes No results for input(s): CKMB, TROPONINI, MYOGLOBIN in the last 168 hours.  Invalid input(s): CK ------------------------------------------------------------------------------------------------------------------    Component Value Date/Time   BNP 282.2 (H) 04/05/2020 4332    Micro Results Recent Results (from the past 240 hour(s))  Culture, blood (Routine X 2) w Reflex to ID Panel     Status: None (Preliminary result)   Collection Time: 04/05/20  9:50 AM   Specimen: BLOOD  Result Value Ref Range Status   Specimen Description BLOOD RIGHT ANTECUBITAL  Final    Special Requests   Final    BOTTLES DRAWN AEROBIC ONLY Blood Culture results may not be optimal due to an inadequate volume of blood received in culture bottles   Culture   Final    NO GROWTH 1 DAY Performed at [facility], [place] [address], [place], [place] [address]    Report Status PENDING  Incomplete  Culture, blood (Routine X 2) w Reflex to ID Panel     Status: None (Preliminary result)   Collection Time: 04/05/20  9:59 AM   Specimen: BLOOD RIGHT HAND  Result Value Ref Range Status   Specimen Description BLOOD RIGHT HAND  Final   Special Requests   Final    BOTTLES DRAWN AEROBIC ONLY Blood Culture results may not be optimal due to an inadequate volume of blood received in culture bottles   Culture   Final    NO GROWTH 1 DAY Performed at [facility], [place] [address] [place], [place] [address]    Report Status PENDING  Incomplete    Radiology Reports DG Chest Port 1 View  Result Date: 04/07/2020 CLINICAL DATA:  Shortness of breath. EXAM: PORTABLE CHEST 1 VIEW COMPARISON:  Prior chest radiograph 04/05/2020 and earlier. FINDINGS: Mild cardiomegaly, unchanged. Aortic atherosclerosis. Patchy bilateral airspace disease, greater on  the right, increased from the prior examination. Persistent small left pleural effusion. No evidence of pneumothorax IMPRESSION: Patchy bilateral airspace disease, greater on the right, increased from the prior examination. Persistent small left pleural effusion. Unchanged cardiomegaly. Electronically Signed   By: Kellie Simmering DO   On: 04/07/2020 08:01   DG Chest Port 1 View  Result Date: 04/05/2020 CLINICAL DATA:  Sob,COVID positive EXAM: PORTABLE CHEST 1 VIEW COMPARISON:  04/05/2019 FINDINGS: Stable cardiac silhouette. Normal increase in mild airspace disease. Mild increased central venous congestion. Small RIGHT effusion. IMPRESSION: Subtle increase in pulmonary airspace disease. Findings concerning for early pneumonia versus pulmonary edema.  Electronically Signed   By: Suzy Bouchard M.D.   On: 04/05/2020 07:38

## 2020-04-07 NOTE — Progress Notes (Signed)
   04/07/20 0854  Assess: MEWS Score  Temp (!) 96.4 F (35.8 C)  BP (!) 99/58  ECG Heart Rate (!) 130  Resp 20  SpO2 95 %  O2 Device Room Air  Assess: MEWS Score  MEWS Temp 1  MEWS Systolic 1  MEWS Pulse 3  MEWS RR 0  MEWS LOC 0  MEWS Score 5  MEWS Score Color Red  Assess: if the MEWS score is Yellow or Red  Were vital signs taken at a resting state? No  Focused Assessment No change from prior assessment  Early Detection of Sepsis Score *See Row Information* Low  MEWS guidelines implemented *See Row Information* Yes  Treat  MEWS Interventions Administered scheduled meds/treatments;Escalated (See documentation below)  Take Vital Signs  Increase Vital Sign Frequency  Red: Q 1hr X 4 then Q 4hr X 4, if remains red, continue Q 4hrs  Escalate  MEWS: Escalate Red: discuss with charge nurse/RN and provider, consider discussing with RRT  Notify: Charge Nurse/RN  Name of Charge Nurse/RN Notified Sarah, RN  Date Charge Nurse/RN Notified 04/07/20  Time Charge Nurse/RN Notified 0945  Notify: Provider  Provider Name/Title Dr. Sloan Leiter  Date Provider Notified 04/07/20  Time Provider Notified [phone]  Notification Type Page  Notification Reason Other (Comment) (HR elevated)  Provider response At bedside  Date of Provider Response 04/07/20  Time of Provider Response 0945

## 2020-04-07 NOTE — Plan of Care (Signed)
  Problem: Education: Goal: Knowledge of General Education information will improve Description: Including pain rating scale, medication(s)/side effects and non-pharmacologic comfort measures Outcome: Progressing   Problem: Health Behavior/Discharge Planning: Goal: Ability to manage health-related needs will improve Outcome: Progressing   Problem: Clinical Measurements: Goal: Ability to maintain clinical measurements within normal limits will improve Outcome: Progressing Goal: Will remain free from infection Outcome: Not Progressing Note: Patient is currently being treated for infection. Goal: Diagnostic test results will improve Outcome: Progressing Goal: Respiratory complications will improve Outcome: Progressing Goal: Cardiovascular complication will be avoided Outcome: Not Progressing Note: Additional time needed to complete goal, patient's heart rate currently in a.fib alternating with RVR   Problem: Activity: Goal: Risk for activity intolerance will decrease Outcome: Progressing   Problem: Nutrition: Goal: Adequate nutrition will be maintained Outcome: Not Progressing Note: Poor oral intake noted   Problem: Coping: Goal: Level of anxiety will decrease Outcome: Progressing   Problem: Elimination: Goal: Will not experience complications related to bowel motility Outcome: Progressing Goal: Will not experience complications related to urinary retention Outcome: Progressing   Problem: Pain Managment: Goal: General experience of comfort will improve Outcome: Progressing   Problem: Safety: Goal: Ability to remain free from injury will improve Outcome: Progressing   Problem: Skin Integrity: Goal: Risk for impaired skin integrity will decrease Outcome: Progressing   Problem: Respiratory: Goal: Will maintain a patent airway Outcome: Progressing Goal: Complications related to the disease process, condition or treatment will be avoided or minimized Outcome:  Progressing   Problem: Coping: Goal: Psychosocial and spiritual needs will be supported Outcome: Progressing   Problem: Education: Goal: Knowledge of risk factors and measures for prevention of condition will improve Outcome: Progressing

## 2020-04-07 NOTE — Progress Notes (Signed)
   04/07/20 0506  Vitals  Temp (!) 95.4 F (35.2 C)  Temp Source Rectal  BP 117/60  MAP (mmHg) 74  BP Location Right Arm  BP Method Automatic  Patient Position (if appropriate) Lying  Pulse Rate 93  Pulse Rate Source Apical  ECG Heart Rate 99  Resp (!) 21  Level of Consciousness  Level of Consciousness Alert  MEWS COLOR  MEWS Score Color Yellow  Oxygen Therapy  SpO2 92 %  O2 Device Room Air  Pulse Oximetry Type Continuous  MEWS Score  MEWS Temp 1  MEWS Systolic 0  MEWS Pulse 0  MEWS RR 1  MEWS LOC 0  MEWS Score 2  Provider Notification  Provider Name/Title Adefoso/MD  Date Provider Notified 04/07/20  Time Provider Notified [phone]  Notification Type Page  Notification Reason Other (Comment) (low rectal temp)   Patient placed under warming blanket at 0545.

## 2020-04-07 NOTE — Progress Notes (Signed)
5:33 AM RN called due to patient being hypothermic with a temperature of 95.24F, Bair hugger was provided.  Chest x-ray done yesterday showed increasing pulmonary airspace disease with concern for early pneumonia vs pulmonary edema.  Chest x-ray this morning personally reviewed continued to show airspace disease.  Procalcitonin was 1.01.  She was started on IV Levaquin.  Blood culture pending

## 2020-04-07 NOTE — Progress Notes (Signed)
Patient has requested that no health information be given to anyone, if they telephone into the station.

## 2020-04-07 NOTE — Progress Notes (Signed)
Pharmacy Antibiotic Note  Tasha [name] is a 80 y.o. female admitted on 04/04/2020 with CAP.  Pharmacy has been consulted for Levaquin dosing.  Plan: Levaquin 750mg  IV q24h Will f/u renal function, micro data, and pt's clinical condition  Height: 5\' 6"  (167.6 cm) Weight: 93.4 kg (205 lb 14.6 oz) IBW/kg (Calculated) : 59.3  Temp (24hrs), Avg:97.7 F (36.5 C), Min:95.4 F (35.2 C), Max:98.6 F (37 C)  Recent Labs  Lab 04/04/20 2110 04/05/20 0817 04/05/20 0939 04/06/20 0241 04/07/20 0228  WBC 5.4  --   --  2.7* 1.7*  CREATININE 0.76  --   --  0.66 0.58  LATICACIDVEN  --  0.9 1.2  --   --     Estimated Creatinine Clearance: 66.7 mL/min (by C-G formula based on SCr of 0.58 mg/dL).    Allergies  Allergen Reactions  . Clindamycin Itching and Rash  . Celebrex [Celecoxib] Other (See Comments)    Hypertension, possible TIA  . Statins Other (See Comments)    Muscle aches  . Tape     Patient CAN tolerate Coban Wrap only (NO TAPE!!)  . Tricor [Fenofibrate] Other (See Comments)    Elevated liver enzymes  . Amoxicillin Rash  . Clarithromycin Rash  . Latex Rash    Antimicrobials this admission: 3/20 Levaquin >>   Microbiology results: 3/18 BCx:   Thank you for allowing pharmacy to be a part of this patient's care.  Sherlon Handing, PharmD, BCPS Please see amion for complete clinical pharmacist phone list 04/07/2020 6:47 AM

## 2020-04-08 ENCOUNTER — Other Ambulatory Visit: Payer: Self-pay | Admitting: Oncology

## 2020-04-08 ENCOUNTER — Telehealth: Payer: Self-pay | Admitting: Oncology

## 2020-04-08 ENCOUNTER — Encounter (HOSPITAL_COMMUNITY): Payer: Self-pay | Admitting: Internal Medicine

## 2020-04-08 DIAGNOSIS — U071 COVID-19: Secondary | ICD-10-CM

## 2020-04-08 DIAGNOSIS — C9110 Chronic lymphocytic leukemia of B-cell type not having achieved remission: Secondary | ICD-10-CM

## 2020-04-08 DIAGNOSIS — A0839 Other viral enteritis: Secondary | ICD-10-CM

## 2020-04-08 LAB — HEMOGLOBIN A1C
Hgb A1c MFr Bld: 6.1 % — ABNORMAL HIGH (ref 4.8–5.6)
Mean Plasma Glucose: 128.37 mg/dL

## 2020-04-08 LAB — COMPREHENSIVE METABOLIC PANEL
ALT: 26 U/L (ref 0–44)
AST: 21 U/L (ref 15–41)
Albumin: 2.2 g/dL — ABNORMAL LOW (ref 3.5–5.0)
Alkaline Phosphatase: 41 U/L (ref 38–126)
Anion gap: 8 (ref 5–15)
BUN: 13 mg/dL (ref 8–23)
CO2: 23 mmol/L (ref 22–32)
Calcium: 8.6 mg/dL — ABNORMAL LOW (ref 8.9–10.3)
Chloride: 102 mmol/L (ref 98–111)
Creatinine, Ser: 0.6 mg/dL (ref 0.44–1.00)
GFR, Estimated: >60 mL/min (ref >60)
Glucose, Bld: 238 mg/dL — ABNORMAL HIGH (ref 70–99)
Potassium: 4.7 mmol/L (ref 3.5–5.1)
Sodium: 133 mmol/L — ABNORMAL LOW (ref 135–145)
Total Bilirubin: 0.6 mg/dL (ref 0.3–1.2)
Total Protein: 4.7 g/dL — ABNORMAL LOW (ref 6.5–8.1)

## 2020-04-08 LAB — DIFFERENTIAL
Abs Immature Granulocytes: 0.02 10*3/uL (ref 0.00–0.07)
Basophils Absolute: 0 10*3/uL (ref 0.0–0.1)
Basophils Relative: 0 %
Eosinophils Absolute: 0 10*3/uL (ref 0.0–0.5)
Eosinophils Relative: 0 %
Immature Granulocytes: 0 %
Lymphocytes Relative: 28 %
Lymphs Abs: 1.4 10*3/uL (ref 0.7–4.0)
Monocytes Absolute: 0.3 10*3/uL (ref 0.1–1.0)
Monocytes Relative: 6 %
Neutro Abs: 3.2 10*3/uL (ref 1.7–7.7)
Neutrophils Relative %: 66 %

## 2020-04-08 LAB — MAGNESIUM: Magnesium: 2.3 mg/dL (ref 1.7–2.4)

## 2020-04-08 LAB — CBC
HCT: 37.7 % (ref 36.0–46.0)
Hemoglobin: 12.7 g/dL (ref 12.0–15.0)
MCH: 27.7 pg (ref 26.0–34.0)
MCHC: 33.7 g/dL (ref 30.0–36.0)
MCV: 82.3 fL (ref 80.0–100.0)
Platelets: 97 10*3/uL — ABNORMAL LOW (ref 150–400)
RBC: 4.58 MIL/uL (ref 3.87–5.11)
RDW: 15.1 % (ref 11.5–15.5)
WBC: 4.9 10*3/uL (ref 4.0–10.5)
nRBC: 0 % (ref 0.0–0.2)

## 2020-04-08 LAB — GLUCOSE, CAPILLARY
Glucose-Capillary: 180 mg/dL — ABNORMAL HIGH (ref 70–99)
Glucose-Capillary: 167 mg/dL — ABNORMAL HIGH (ref 70–99)
Glucose-Capillary: 159 mg/dL — ABNORMAL HIGH (ref 70–99)

## 2020-04-08 LAB — PROCALCITONIN: Procalcitonin: 0.4 ng/mL

## 2020-04-08 LAB — PHOSPHORUS: Phosphorus: 3.1 mg/dL (ref 2.5–4.6)

## 2020-04-08 LAB — C-REACTIVE PROTEIN: CRP: 5.6 mg/dL — ABNORMAL HIGH (ref <1.0)

## 2020-04-08 LAB — D-DIMER, QUANTITATIVE: D-Dimer, Quant: 0.73 ug/mL-FEU — ABNORMAL HIGH (ref 0.00–0.50)

## 2020-04-08 MED ORDER — PREDNISONE 20 MG PO TABS: 40.0000 mg | ORAL_TABLET | Freq: Every day | ORAL | Status: DC

## 2020-04-08 MED ORDER — METOPROLOL TARTRATE 50 MG PO TABS
50.0000 mg | ORAL_TABLET | Freq: Three times a day (TID) | ORAL | Status: DC
  Administered 2020-04-09 (×3): 50 mg via ORAL
  Filled 2020-04-08 (×4): qty 1

## 2020-04-08 MED ORDER — INSULIN ASPART 100 UNIT/ML ~~LOC~~ SOLN
0.0000 [IU] | Freq: Three times a day (TID) | SUBCUTANEOUS | Status: DC
  Administered 2020-04-08 – 2020-04-09 (×3): 2 [IU] via SUBCUTANEOUS
  Administered 2020-04-10: 3 [IU] via SUBCUTANEOUS
  Administered 2020-04-10: 2 [IU] via SUBCUTANEOUS
  Administered 2020-04-11: 1 [IU] via SUBCUTANEOUS
  Administered 2020-04-11: 2 [IU] via SUBCUTANEOUS

## 2020-04-08 MED ORDER — METOPROLOL TARTRATE 50 MG PO TABS
50.0000 mg | ORAL_TABLET | Freq: Two times a day (BID) | ORAL | Status: DC
Start: 2020-04-08 — End: 2020-04-08
  Administered 2020-04-08 (×2): 50 mg via ORAL
  Filled 2020-04-08 (×2): qty 1

## 2020-04-08 MED ORDER — METHIMAZOLE 5 MG PO TABS: 5.0000 mg | ORAL_TABLET | Freq: Three times a day (TID) | ORAL | Status: DC

## 2020-04-08 MED ORDER — METHIMAZOLE 5 MG PO TABS
5.0000 mg | ORAL_TABLET | Freq: Two times a day (BID) | ORAL | Status: DC
  Administered 2020-04-08 – 2020-04-12 (×9): 5 mg via ORAL
  Filled 2020-04-08 (×9): qty 1

## 2020-04-08 NOTE — Progress Notes (Signed)
   04/07/20 2109 04/07/20 2200  Assess: MEWS Score  Temp 100.3 F (37.9 C)  --   BP 120/69  --   Pulse Rate (!) 120  --   ECG Heart Rate (!) 113  --   Resp (!) 27  --   Level of Consciousness Alert  --   SpO2 92 %  --   O2 Device Room Air  --   Assess: MEWS Score  MEWS Temp 0  --   MEWS Systolic 0  --   MEWS Pulse 2  --   MEWS RR 2  --   MEWS LOC 0  --   MEWS Score 4  --   MEWS Score Color Red  --   Assess: if the MEWS score is Yellow or Red  Were vital signs taken at a resting state? No  --   Focused Assessment No change from prior assessment  --   Early Detection of Sepsis Score *See Row Information* Low  --   MEWS guidelines implemented *See Row Information* No, previously red, continue vital signs every 4 hours  --   Treat  MEWS Interventions Administered prn meds/treatments  --   Pain Scale 0-10 0-10  Pain Score 3 0  Pain Type Acute pain  --   Pain Location Generalized  --   Pain Intervention(s) Medication (See eMAR)  --   Notify: Charge Nurse/RN  Name of Charge Nurse/RN Notified Nurse, mental health  --   Date Charge Nurse/RN Notified 04/07/20  --   Time Charge Nurse/RN Notified 2109  --   Notify: Provider  Provider Name/Title  --  Adefoso/MD  Date Provider Notified  --  04/07/20  Time Provider Notified  --  2208  Notification Type  --  Page  Notification Reason  --  Other (Comment) (elevated temp,tenting skin)  Provider response  --  Evaluate remotely  Date of Provider Response  --  04/07/20  Time of Provider Response  --  2217  Document  Patient Outcome Not stable and remains on department  --   Progress note created (see row info) Yes  --    Patient returned to red mews after 1 interval of yellow MEWs.  Red mews triggered by elevated temperature and heart rate, patient given PRN tylenol and bed bath.  Provider has been notified.

## 2020-04-08 NOTE — Progress Notes (Addendum)
Ok to add stop date of 5d for Levaquin per Dr. Sloan Leiter. Ok to [employer].   Onnie Boer, PharmD, BCIDP, AAHIVP, CPP Infectious Disease Pharmacist 04/08/2020 11:04 AM

## 2020-04-08 NOTE — Progress Notes (Addendum)
HEMATOLOGY-ONCOLOGY PROGRESS NOTE  SUBJECTIVE: [name] has been admitted to the hospital.  She presented with weakness and diarrhea and was found to have COVID-19 infection.  This admission, she was found to have pneumonia and sepsis.  She also has PAF with RVR and was found to have hyperthyroidism.  She has had some transient leukopenia this admission which has now resolved and had some mild worsening of her thrombocytopenia down to 77,000, but is now back up to baseline. She still is having a fair amount of weakness.  She has a poor appetite.  She is not currently having any chest pain or shortness of breath.  Her diarrhea has resolved.  Heart rate when I was in the room got up to the 140s to 150s.  She was asymptomatic from the tachycardia.  REVIEW OF SYSTEMS:   The remainder of the review of systems noncontributory.  I have reviewed the past medical history, past surgical history, social history and family history with the patient and they are unchanged from previous note.   PHYSICAL EXAMINATION: ECOG PERFORMANCE STATUS: 1 - Symptomatic but completely ambulatory  Vitals:   04/08/20 0743 04/08/20 1215  BP: 116/77 111/67  Pulse: (!) 126 (!) 121  Resp: (!) 30 20  Temp: 97.6 F (36.4 C) 98.1 F (36.7 C)  SpO2: 93% 93%   Filed Weights   04/06/20 0403 04/07/20 0602 04/08/20 0418  Weight: 94 kg 93.4 kg 94.4 kg    Intake/Output from previous day: 03/20 0701 - 03/21 0700 In: 900 [P.O.:750; IV Piggyback:150] Out: -   GENERAL:alert, no distress and comfortable NEURO: alert & oriented x 3 with fluent speech, no focal motor/sensory deficits  LABORATORY DATA:  I have reviewed the data as listed CMP Latest Ref Rng & Units 04/08/2020 04/07/2020 04/06/2020  Glucose 70 - 99 mg/dL 238(H) 225(H) 135(H)  BUN 8 - 23 mg/dL 13 11 6(L)  Creatinine 0.44 - 1.00 mg/dL 0.60 0.58 0.66  Sodium 135 - 145 mmol/L 133(L) 133(L) 134(L)  Potassium 3.5 - 5.1 mmol/L 4.7 4.0 3.9  Chloride 98 - 111 mmol/L 102 102  102  CO2 22 - 32 mmol/L 23 22 25   Calcium 8.9 - 10.3 mg/dL 8.6(L) 8.5(L) 8.2(L)  Total Protein 6.5 - 8.1 g/dL 4.7(L) 4.9(L) 4.6(L)  Total Bilirubin 0.3 - 1.2 mg/dL 0.6 0.6 0.7  Alkaline Phos 38 - 126 U/L 41 40 41  AST 15 - 41 U/L 21 18 23   ALT 0 - 44 U/L 26 26 27     Lab Results  Component Value Date   WBC 4.9 04/08/2020   HGB 12.7 04/08/2020   HCT 37.7 04/08/2020   MCV 82.3 04/08/2020   PLT 97 (L) 04/08/2020   NEUTROABS 3.2 04/08/2020    DG Chest Port 1 View  Result Date: 04/07/2020 CLINICAL DATA:  Shortness of breath. EXAM: PORTABLE CHEST 1 VIEW COMPARISON:  Prior chest radiograph 04/05/2020 and earlier. FINDINGS: Mild cardiomegaly, unchanged. Aortic atherosclerosis. Patchy bilateral airspace disease, greater on the right, increased from the prior examination. Persistent small left pleural effusion. No evidence of pneumothorax IMPRESSION: Patchy bilateral airspace disease, greater on the right, increased from the prior examination. Persistent small left pleural effusion. Unchanged cardiomegaly. Electronically Signed   By: Kellie Simmering DO   On: 04/07/2020 08:01   DG Chest Port 1 View  Result Date: 04/05/2020 CLINICAL DATA:  Sob,COVID positive EXAM: PORTABLE CHEST 1 VIEW COMPARISON:  04/05/2019 FINDINGS: Stable cardiac silhouette. Normal increase in mild airspace disease. Mild increased central venous congestion.  Small RIGHT effusion. IMPRESSION: Subtle increase in pulmonary airspace disease. Findings concerning for early pneumonia versus pulmonary edema. Electronically Signed   By: Suzy Bouchard M.D.   On: 04/05/2020 07:38   ECHOCARDIOGRAM COMPLETE  Result Date: 04/07/2020    ECHOCARDIOGRAM REPORT   Patient Name:   Tasha [name] Date of Exam: 04/06/2020 Medical Rec #:  [id number]     Height:       66.0 in Accession #:    [id number]    Weight:       207.2 lb Date of Birth:  Apr 20, 1940      BSA:          2.030 m Patient Age:    80 years      BP:           116/79 mmHg Patient Gender: F              HR:           109 bpm. Exam Location:  Inpatient Procedure: 2D Echo, Cardiac Doppler and Color Doppler Indications:    Acute Respiratory Distress  History:        Patient has prior history of Echocardiogram examinations, most                 recent 11/01/2017. Covid 19 positive.  Sonographer:    Merrie Roof RDCS Referring Phys: [place]  1. Left ventricular ejection fraction, by estimation, is 65 to 70%. The left ventricle has normal function. The left ventricle has no regional wall motion abnormalities. Left ventricular diastolic function could not be evaluated.  2. Right ventricular systolic function is mildly reduced. The right ventricular size is mildly enlarged. There is mildly elevated pulmonary artery systolic pressure. The estimated right ventricular systolic pressure is 82.9 mmHg.  3. Left atrial size was mildly dilated.  4. Right atrial size was mildly dilated.  5. The mitral valve was not assessed. No evidence of mitral valve regurgitation. No evidence of mitral stenosis.  6. The aortic valve was not assessed. Aortic valve regurgitation is not visualized. No aortic stenosis is present.  7. The inferior vena cava is normal in size with greater than 50% respiratory variability, suggesting right atrial pressure of 3 mmHg. FINDINGS  Left Ventricle: Left ventricular ejection fraction, by estimation, is 65 to 70%. The left ventricle has normal function. The left ventricle has no regional wall motion abnormalities. The left ventricular internal cavity size was normal in size. There is  no left ventricular hypertrophy. Left ventricular diastolic function could not be evaluated. Right Ventricle: The right ventricular size is mildly enlarged. No increase in right ventricular wall thickness. Right ventricular systolic function is mildly reduced. There is mildly elevated pulmonary artery systolic pressure. The tricuspid regurgitant  velocity is 2.77 m/s, and with an assumed right  atrial pressure of 3 mmHg, the estimated right ventricular systolic pressure is 56.2 mmHg. Left Atrium: Left atrial size was mildly dilated. Right Atrium: Right atrial size was mildly dilated. Pericardium: There is no evidence of pericardial effusion. Mitral Valve: The mitral valve was not assessed. No evidence of mitral valve regurgitation. No evidence of mitral valve stenosis. Tricuspid Valve: The tricuspid valve is normal in structure. Tricuspid valve regurgitation is mild . No evidence of tricuspid stenosis. Aortic Valve: The aortic valve was not assessed. Aortic valve regurgitation is not visualized. No aortic stenosis is present. Pulmonic Valve: The pulmonic valve was normal in structure. Pulmonic valve regurgitation is not visualized. No evidence  of pulmonic stenosis. Aorta: The aortic root is normal in size and structure. Venous: The inferior vena cava is normal in size with greater than 50% respiratory variability, suggesting right atrial pressure of 3 mmHg. IAS/Shunts: No atrial level shunt detected by color flow Doppler.  RIGHT VENTRICLE RV Basal diam:  3.30 cm LEFT ATRIUM              Index       RIGHT ATRIUM           Index LA Vol (A2C):   125.0 ml 61.57 ml/m RA Area:     22.20 cm LA Vol (A4C):   77.1 ml  37.98 ml/m RA Volume:   75.00 ml  36.94 ml/m LA Biplane Vol: 99.5 ml  49.01 ml/m  TRICUSPID VALVE TR Peak grad:   30.7 mmHg TR Vmax:        277.00 cm/s Ena Dawley MD Electronically signed by Ena Dawley MD Signature Date/Time: 04/07/2020/12:14:58 PM    Final     ASSESSMENT: 80 y.o. 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  She has never required treatment.    We have been following her on a yearly basis.  Labs from this admission been reviewed and she had some transient leukopenia which has now resolved.  Her platelets drifted down lower than her baseline, but she has now returned to baseline.  Lab results were reviewed with the patient today.  Transient drop in her WBC and platelets are likely due to acute infection/sepsis.  She was reassured that her white blood cell count is now back to normal and platelets are now at baseline. We will plan for continued observation and will schedule an appointment for her for outpatient follow-up with [place] in April/May.   She is noted to have hyperthyroidism.  Primary team is recommending that she start on methimazole.  From our standpoint, okay to start this medication.  She will need outpatient follow-up with endocrinology in the outpatient setting.   LOS: 3 days   Mikey Bussing, DNP, AGPCNP-BC, AOCNP 04/08/20   ADDENDUM: No problems from the plan thyroid treatment for Rayshawn's hyperthyroidism.  I am concerned that her humoral immunity, as is the case and most CLL patients, it may be very low.  I am adding that for tomorrow's labs.  If it is low we can consider IVIG prophylactically.  We will follow peripherally through this admission.  Chauncey Cruel, MD Medical Oncology and Hematology [facility] [address] [place], [place] [address] Tel. [phone]    Fax. [phone]

## 2020-04-08 NOTE — Consult Note (Addendum)
Cardiology Consultation:   Patient ID: Tasha [name] MRN: [id number]; DOB: [birth year]  Admit date: 04/04/2020 Date of Consult: 04/08/2020  PCP:  Lavone Orn, [place]  Cardiologist:  Sanda Klein, MD  Advanced Practice Provider:  No care team member to display Electrophysiologist:  None    Patient Profile:   Tasha [name] is a 80 y.o. female with a hx of PAF, CLL with mild thrombocytopenia, MS, and peripheral neuropathy who is being seen today for the evaluation of afib with RVR at the request of Dr. Sallyanne Kuster.  History of Present Illness:   Tasha [name] is a pleasant 80 year old female with past medical history of PAF, CLL with mild thrombocytopenia, MS, and peripheral neuropathy.  Previous echocardiogram obtained on 11/01/2017 showed EF 67 to 70%, grade 1 DD, no regional wall motion abnormality, mild TR.  Patient has been on 12.5 mg twice daily dosing of metoprolol and Eliquis at home.  She was last seen by Fabian Sharp PA-C on 11/27/2019 at which time she was doing well from cardiac perspective.  Last EKG obtained in September 2021 still shows she is holding sinus rhythm.  She does not have any cardiac awareness of atrial fibrillation.  Unfortunately patient's husband tested positive for Covid in February.  A week later, she became sick around February 28.  Prior to that, she did receive Covid vaccine booster.  She had fever, chills cough, malaise and vomiting with nasal congestion.  Afterward, she started having significant diarrhea.  She presented to [facility] on 04/05/2020 due to weakness.  On arrival, she was febrile with a temperature of 103.1.  EKG shows atrial fibrillation with RVR heart rate in the 130s.  COVID-19 screening was positive.  Sodium level was low at 127.  Albumin 2.9.  Potassium 3.2.  She was treated for sepsis and gastroenteritis in the setting of COVID-19.  During the same hospitalization, she was noted to have TSH of 0.121 and a  free T4 of 1.36 consistent with hyperthyroidism.  Heart rate has been difficult to control during this admission, her home metoprolol has been increased from the previous 12.5 mg twice daily to 50 mg twice daily without significant rate control.    Past Medical History:  Diagnosis Date  . Atrial fibrillation ([place])    questionable, echo 03/23/11 - EF >55%, on warfarin  . Bell's palsy   . 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 Charcot-Marie disease   . 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 Gait disturbance   . Gallstones   . 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 IFG (impaired fasting glucose)    e  . Personal history of other diseases of circulatory system   . Tick bite   . Unspecified hereditary and idiopathic peripheral neuropathy   . Vitamin B12 deficiency   . Vitamin D deficiency     Past Surgical History:  Procedure Laterality Date  . ABDOMINAL HYSTERECTOMY  1984   fibroids  . BREAST SURGERY  1960s   benign breast tumors  . CATARACT EXTRACTION W/ INTRAOCULAR LENS  IMPLANT, BILATERAL    . LAPAROSCOPIC BILATERAL SALPINGO OOPHERECTOMY  2003   ovarian cyst     Home Medications:  Prior to Admission medications   Medication Sig Start Date End Date Taking? Authorizing Provider  apixaban (ELIQUIS) 5 MG TABS tablet TAKE 1 TABLET(5 MG) BY MOUTH TWICE DAILY Patient taking differently: Take 5 mg by mouth 2 (two) times daily.  10/30/19  Yes Croitoru, Mihai, MD  cholecalciferol (VITAMIN D) 1000 units tablet Take 1,000 Units by mouth daily.   Yes [provider]  Cyanocobalamin (VITAMIN B 12 PO) Take 100 mg by mouth daily.    Yes [provider]  metoprolol tartrate (LOPRESSOR) 25 MG tablet Take 0.5 tablets (12.5 mg total) by mouth 2 (two) times daily. 10/11/19 01/09/20 Yes Duke, Tami Lin, PA    Inpatient Medications: Scheduled Meds: . apixaban  5 mg Oral BID  . vitamin C  500 mg Oral Daily  . famotidine  20 mg Oral BID  .  feeding supplement  237 mL Oral BID BM  . insulin aspart  0-9 Units Subcutaneous TID WC  . methimazole  5 mg Oral BID  . metoprolol tartrate  50 mg Oral BID  . [START ON 04/09/2020] predniSONE  40 mg Oral Q breakfast  . zinc sulfate  220 mg Oral Daily   Continuous Infusions: . levofloxacin (LEVAQUIN) IV Stopped (04/08/20 1735)   PRN Meds: acetaminophen, albuterol, loperamide, metoprolol tartrate, trimethobenzamide  Allergies:    Allergies  Allergen Reactions  . Clindamycin Itching and Rash  . Celebrex [Celecoxib] Other (See Comments)    Hypertension, possible TIA  . Statins Other (See Comments)    Muscle aches  . Tape     Patient CAN tolerate Coban Wrap only (NO TAPE!!)  . Tricor [Fenofibrate] Other (See Comments)    Elevated liver enzymes  . Amoxicillin Rash  . Clarithromycin Rash  . Latex Rash    Social History:   Social History   Socioeconomic History  . Marital status: Married    Spouse name: Colen  . Number of children: 3  . Years of education: college  . Highest education level: Not on file  Occupational History    Comment: Retired  Tobacco Use  . Smoking status: Never Smoker  . Smokeless tobacco: Never Used  Substance and Sexual Activity  . Alcohol use: No    Alcohol/week: 0.0 standard drinks  . Drug use: No  . Sexual activity: Not on file  Other Topics Concern  . 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 Patient has college education.    Right handed.   Caffeine- not every day . Soda or tea when she is out for dinner.   Social Determinants of Health   Financial Resource Strain: Not on file  Food Insecurity: Not on file  Transportation Needs: Not on file  Physical Activity: Not on file  Stress: Not on file  Social Connections: Not on file  Intimate Partner Violence: Not on file    Family History:    Family History  Problem Relation Age of Onset  . Hypertension Mother   . Coronary artery disease  Father        died at 68  . Hypertension Father   . Diabetes Father   . Coronary artery disease Brother        died at 18  . Diabetes Brother   . Hypertension Brother   . Cancer Daughter      ROS:  Please see the history of present illness.   All other ROS reviewed and negative.     Physical Exam/Data:   Vitals:   04/08/20 0418 04/08/20 0743 04/08/20 1215 04/08/20 1618  BP: 128/82 116/77 111/67 112/68  Pulse: (!) 133 (!) 126 (!) 121 (!) 120  Resp: (!) 26 (!) 30 20 (!) 28  Temp:  97.6 F (36.4 C) 97.6 F (36.4 C) 98.1 F (36.7 C) 98.1 F (36.7 C)  TempSrc: Oral Oral Oral Oral  SpO2: 94% 93% 93% 94%  Weight: 94.4 kg     Height:        Intake/Output Summary (Last 24 hours) at 04/08/2020 1813 Last data filed at 04/08/2020 1735 Gross per 24 hour  Intake 660 ml  Output 600 ml  Net 60 ml   Last 3 Weights 04/08/2020 04/07/2020 04/06/2020  Weight (lbs) 208 lb 1.8 oz 205 lb 14.6 oz 207 lb 3.7 oz  Weight (kg) 94.4 kg 93.4 kg 94 kg     Body mass index is 33.59 kg/m.  General:  Well nourished, well developed, in no acute distress HEENT: normal Lymph: no adenopathy Neck: no JVD Endocrine:  No thryomegaly Vascular: No carotid bruits; FA pulses 2+ bilaterally without bruits  Cardiac:  normal S1, S2; irregularly irregular; no murmur  Lungs: Diminished breath sounds in bilateral bases especially in the left base of the lung. Abd: soft, nontender, no hepatomegaly  Ext: no edema Musculoskeletal:  No deformities, BUE and BLE strength normal and equal Skin: warm and dry  Neuro:  CNs 2-12 intact, no focal abnormalities noted Psych:  Normal affect   EKG:  The EKG was personally reviewed and demonstrates: Atrial fibrillation with RVR Telemetry:  Telemetry was personally reviewed and demonstrates: Atrial fibrillation with RVR heart rate 110-150s  Relevant CV Studies:  Echo 04/06/2020 1. Left ventricular ejection fraction, by estimation, is 65 to 70%. The  left ventricle has normal  function. The left ventricle has no regional  wall motion abnormalities. Left ventricular diastolic function could not  be evaluated.  2. Right ventricular systolic function is mildly reduced. The right  ventricular size is mildly enlarged. There is mildly elevated pulmonary  artery systolic pressure. The estimated right ventricular systolic  pressure is 17.6 mmHg.  3. Left atrial size was mildly dilated.  4. Right atrial size was mildly dilated.  5. The mitral valve was not assessed. No evidence of mitral valve  regurgitation. No evidence of mitral stenosis.  6. The aortic valve was not assessed. Aortic valve regurgitation is not  visualized. No aortic stenosis is present.  7. The inferior vena cava is normal in size with greater than 50%  respiratory variability, suggesting right atrial pressure of 3 mmHg.   Laboratory Data:  High Sensitivity Troponin:  No results for input(s): TROPONINIHS in the last 720 hours.   Chemistry Recent Labs  Lab 04/06/20 0241 04/07/20 0228 04/08/20 0412  NA 134* 133* 133*  K 3.9 4.0 4.7  CL 102 102 102  CO2 25 22 23   GLUCOSE 135* 225* 238*  BUN 6* 11 13  CREATININE 0.66 0.58 0.60  CALCIUM 8.2* 8.5* 8.6*  GFRNONAA >60 >60 >60  ANIONGAP 7 9 8     Recent Labs  Lab 04/06/20 0241 04/07/20 0228 04/08/20 0412  PROT 4.6* 4.9* 4.7*  ALBUMIN 2.3* 2.3* 2.2*  AST 23 18 21   ALT 27 26 26   ALKPHOS 41 40 41  BILITOT 0.7 0.6 0.6   Hematology Recent Labs  Lab 04/06/20 0241 04/07/20 0228 04/08/20 0412  WBC 2.7* 1.7* 4.9  RBC 4.33 4.58 4.58  HGB 12.2 12.7 12.7  HCT 35.3* 37.1 37.7  MCV 81.5 81.0 82.3  MCH 28.2 27.7 27.7  MCHC 34.6 34.2 33.7  RDW 14.6 14.9 15.1  PLT 77* 78* 97*   BNP Recent Labs  Lab 04/05/20 0939  BNP 282.2*  DDimer  Recent Labs  Lab 04/07/20 0228 04/08/20 0412  DDIMER 1.54* 0.73*     Radiology/Studies:  DG Chest Port 1 View  Result Date: 04/07/2020 CLINICAL DATA:  Shortness of breath. EXAM: PORTABLE  CHEST 1 VIEW COMPARISON:  Prior chest radiograph 04/05/2020 and earlier. FINDINGS: Mild cardiomegaly, unchanged. Aortic atherosclerosis. Patchy bilateral airspace disease, greater on the right, increased from the prior examination. Persistent small left pleural effusion. No evidence of pneumothorax IMPRESSION: Patchy bilateral airspace disease, greater on the right, increased from the prior examination. Persistent small left pleural effusion. Unchanged cardiomegaly. Electronically Signed   By: Kellie Simmering DO   On: 04/07/2020 08:01   DG Chest Port 1 View  Result Date: 04/05/2020 CLINICAL DATA:  Sob,COVID positive EXAM: PORTABLE CHEST 1 VIEW COMPARISON:  04/05/2019 FINDINGS: Stable cardiac silhouette. Normal increase in mild airspace disease. Mild increased central venous congestion. Small RIGHT effusion. IMPRESSION: Subtle increase in pulmonary airspace disease. Findings concerning for early pneumonia versus pulmonary edema. Electronically Signed   By: Suzy Bouchard M.D.   On: 04/05/2020 07:38   ECHOCARDIOGRAM COMPLETE  Result Date: 04/07/2020    ECHOCARDIOGRAM REPORT   Patient Name:   Tasha [name] Date of Exam: 04/06/2020 Medical Rec #:  [id number]     Height:       66.0 in Accession #:    [id number]    Weight:       207.2 lb Date of Birth:  14-Dec-1940      BSA:          2.030 m Patient Age:    21 years      BP:           116/79 mmHg Patient Gender: F             HR:           109 bpm. Exam Location:  Inpatient Procedure: 2D Echo, Cardiac Doppler and Color Doppler Indications:    Acute Respiratory Distress  History:        Patient has prior history of Echocardiogram examinations, most                 recent 11/01/2017. Covid 19 positive.  Sonographer:    Merrie Roof RDCS Referring Phys: [place]  1. Left ventricular ejection fraction, by estimation, is 65 to 70%. The left ventricle has normal function. The left ventricle has no regional wall motion abnormalities. Left ventricular  diastolic function could not be evaluated.  2. Right ventricular systolic function is mildly reduced. The right ventricular size is mildly enlarged. There is mildly elevated pulmonary artery systolic pressure. The estimated right ventricular systolic pressure is 41.9 mmHg.  3. Left atrial size was mildly dilated.  4. Right atrial size was mildly dilated.  5. The mitral valve was not assessed. No evidence of mitral valve regurgitation. No evidence of mitral stenosis.  6. The aortic valve was not assessed. Aortic valve regurgitation is not visualized. No aortic stenosis is present.  7. The inferior vena cava is normal in size with greater than 50% respiratory variability, suggesting right atrial pressure of 3 mmHg. FINDINGS  Left Ventricle: Left ventricular ejection fraction, by estimation, is 65 to 70%. The left ventricle has normal function. The left ventricle has no regional wall motion abnormalities. The left ventricular internal cavity size was normal in size. There is  no left ventricular hypertrophy. Left ventricular diastolic function could not be evaluated. Right Ventricle: The right ventricular size is mildly  enlarged. No increase in right ventricular wall thickness. Right ventricular systolic function is mildly reduced. There is mildly elevated pulmonary artery systolic pressure. The tricuspid regurgitant  velocity is 2.77 m/s, and with an assumed right atrial pressure of 3 mmHg, the estimated right ventricular systolic pressure is 72.0 mmHg. Left Atrium: Left atrial size was mildly dilated. Right Atrium: Right atrial size was mildly dilated. Pericardium: There is no evidence of pericardial effusion. Mitral Valve: The mitral valve was not assessed. No evidence of mitral valve regurgitation. No evidence of mitral valve stenosis. Tricuspid Valve: The tricuspid valve is normal in structure. Tricuspid valve regurgitation is mild . No evidence of tricuspid stenosis. Aortic Valve: The aortic valve was not  assessed. Aortic valve regurgitation is not visualized. No aortic stenosis is present. Pulmonic Valve: The pulmonic valve was normal in structure. Pulmonic valve regurgitation is not visualized. No evidence of pulmonic stenosis. Aorta: The aortic root is normal in size and structure. Venous: The inferior vena cava is normal in size with greater than 50% respiratory variability, suggesting right atrial pressure of 3 mmHg. IAS/Shunts: No atrial level shunt detected by color flow Doppler.  RIGHT VENTRICLE RV Basal diam:  3.30 cm LEFT ATRIUM              Index       RIGHT ATRIUM           Index LA Vol (A2C):   125.0 ml 61.57 ml/m RA Area:     22.20 cm LA Vol (A4C):   77.1 ml  37.98 ml/m RA Volume:   75.00 ml  36.94 ml/m LA Biplane Vol: 99.5 ml  49.01 ml/m  TRICUSPID VALVE TR Peak grad:   30.7 mmHg TR Vmax:        277.00 cm/s Ena Dawley MD Electronically signed by Ena Dawley MD Signature Date/Time: 04/07/2020/12:14:58 PM    Final      Assessment and Plan:   1. Atrial fibrillation with RVR  -Compliant with Eliquis recently.  On 12.5 mg twice daily dosing of metoprolol tartrate at home.  Metoprolol tartrate has been increased to 50 mg twice daily dosing.  Systolic blood pressure 947S at this time.  I will further increase metoprolol tartrate to 50 mg 3 times daily dosing.  -If heart rate is still uncontrolled tomorrow, may consider add digoxin to her medical regimen.  Amiodarone is not ideal given thyroid issue.  However if digoxin cannot control her heart rate, may consider other antiarrhythmic therapy.  -As long as her breathing stable, will hold off on diuretic for the time being.  2. COVID-19 gastroenteritis: Both her husband and herself had Covid in February.  3. Sepsis: Related to COVID-19.  On steroid and antibiotic.  4. Hyperthyroidism: Started on methimazole.  5. Hyponatremia: Related to recent diarrhea.  6. Thrombocytopenia: History of CLL, but may also be driven by [id number]  infection.  Seen by oncology service, her drop in platelet level appears to be transient, it was suspected worsening thrombocytopenia is more related to infection/sepsis rather than any change to CLL.  7. CLL: She has never required treatment in the past for this.   Risk Assessment/Risk Scores:          CHA2DS2-VASc Score = 3  This indicates a 3.2% annual risk of stroke. The patient's score is based upon: CHF History: No HTN History: No Diabetes History: No Stroke History: No Vascular Disease History: No Age Score: 2 Gender Score: 1  For questions or updates, please contact [place] Please consult [email or web address] for contact info under    Hilbert Corrigan, [place]  04/08/2020 6:13 PM   Personally seen and examined. Agree with above.   80 year old here with Covid pneumonia, thrombocytopenia, daughter in room.  Has paroxysmal atrial fibrillation and hyperthyroidism on methimazole.  Currently heart rates have been in the 130s to 140s.  She is also had fever periodically.  Hyponatremia also noted.  She has mild shortness of breath currently, appears fairly comfortable in bed.  Heart irregularly irregular mildly tachycardic with telemetry demonstrating 120s to 130s No edema.  Echo-EF 65 to 70%  Assessment and plan:  Atrial fibrillation with rapid ventricular response -Paroxysmal in nature.  Currently being caused by Covid pneumonia, hyperthyroidism.  EF normal. -We will increase her metoprolol to 53 times a day. -Consider digoxin.  Blood pressure in the 100-110 range. -Agree that amiodarone is not ideal given her hyperthyroidism as well as patchy airspace disease.  Thrombocytopenia -Hematology note, Dr. Jana Hakim reviewed.  Has underlying CLL.  Hyperthyroidism -Methimazole has been started by primary team.  Of course, this can drive her atrial fibrillation as well.  Usually, we like to get this under control before attempting cardioversions.  At this point,  she is hemodynamically stable and does not require cardioversion.  Candee Furbish, MD

## 2020-04-08 NOTE — Telephone Encounter (Signed)
Scheduled appts per 3/21 sch msg. Called pt, no answer. Left msg with appts dates and times.

## 2020-04-08 NOTE — TOC Initial Note (Signed)
Transition of Care [facility]) - Initial/Assessment Note    Patient Details  Name: Tasha [name] MRN: [id number] Date of Birth: 09/03/1940  Transition of Care [facility]) CM/SW Contact:    Ninfa Meeker, RN Phone Number: 04/08/2020, 12:03 PM  Clinical Narrative:  Case Manager spoke with patient concerning discharge needs. Discussed need for Home Health therapy and offered choice of agencies.Patient states she  Has used [facility] (formerly [facility]). Case manager contacted [place], [place] with referral. [facility] will accept patient,first visit will be within 3-5 days of discharge. TOC team will continue to monitor.        Expected Discharge Plan: [place] Barriers to Discharge: Continued Medical Work up   Patient Goals and CMS Choice Patient states their goals for this hospitalization and ongoing recovery are:: get better CMS Medicare.gov Compare Post Acute Care list provided to:: Patient Choice offered to / list presented to : Patient  Expected Discharge Plan and Services Expected Discharge Plan: [place] In-house Referral: NA Discharge Planning Services: CM Consult Post Acute Care Choice: [place] arrangements for the past 2 months: Single Family Home                 DME Arranged: N/A DME Agency: NA       HH Arranged: PT HH Agency: [facility] ([facility]) Date [place]: 04/08/20 Time [place]: 24 Representative spoke with at [place]: [place]  Prior Living Arrangements/Services Living arrangements for the past 2 months: [place] with:: Spouse Patient language and need for interpreter reviewed:: Yes Do you feel safe going back to the place where you live?: Yes      Need for Family Participation in Patient Care: Yes (Comment) Care giver support system in place?: Yes (comment) Current home services: DME Criminal Activity/Legal Involvement Pertinent  to Current Situation/Hospitalization: No - Comment as needed  Activities of Daily Living Home Assistive Devices/Equipment: Cane (specify quad or straight) ADL Screening (condition at time of admission) Patient's cognitive ability adequate to safely complete daily activities?: Yes Is the patient deaf or have difficulty hearing?: No Does the patient have difficulty seeing, even when wearing glasses/contacts?: Yes Does the patient have difficulty concentrating, remembering, or making decisions?: No Patient able to express need for assistance with ADLs?: Yes Does the patient have difficulty dressing or bathing?: No Independently performs ADLs?: Yes (appropriate for developmental age) Does the patient have difficulty walking or climbing stairs?: No Weakness of Legs: Both Weakness of Arms/Hands: Both  Permission Sought/Granted Permission sought to share information with : Case Manager       Permission granted to share info w AGENCY: [facility] (formerly [facility])        Emotional Assessment   Attitude/Demeanor/Rapport: Gracious   Orientation: : Oriented to Self,Oriented to Place,Oriented to  Time,Oriented to Situation Alcohol / Substance Use: Not Applicable Psych Involvement: No (comment)  Admission diagnosis:  Hypokalemia [E87.6] Hyponatremia [E87.1] Gastroenteritis due to 2019 novel coronavirus [U07.1, A08.39] COVID-19 virus infection [U07.1] Patient Active Problem List   Diagnosis Date Noted  . Gastroenteritis due to 2019 novel coronavirus 04/05/2020  . Prolonged QT interval 04/05/2020  . Sepsis ([place]) 04/05/2020  . Generalized weakness 04/05/2020  . Hypokalemia 04/05/2020  . Hyponatremia 04/05/2020  . Paroxysmal atrial fibrillation ([place]) 10/22/2017  . 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 Thrombocytopenia ([place]) 07/19/2017  . Chronic lymphoid leukemia ([place]) 07/14/2017  . 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  Atrial tachycardia ([place]) 03/23/2013  . Vitamin  D deficiency   . Vitamin B12 deficiency   . Hereditary and idiopathic peripheral neuropathy   . Gait disturbance    PCP:  Lavone Orn, MD Pharmacy:   [id number] [address], [place] [address] Phone: [phone] Fax: [phone]     Social Determinants of Health (SDOH) Interventions    Readmission Risk Interventions No flowsheet data found.

## 2020-04-08 NOTE — Progress Notes (Addendum)
PROGRESS NOTE                                                                                                                                                                                                             Patient Demographics:    Tasha [name], is a 80 y.o. female, DOB - [birth year], WGY:[id number]  Outpatient Primary MD for the patient is Lavone Orn, MD   Admit date - 04/04/2020   LOS - 3  Chief Complaint  Patient presents with  . Nausea  . Diarrhea       Brief Narrative: Patient is a 81 y.o. female with PMHx of multiple sclerosis, CLL, PAF-who presented with weakness and diarrhea-she was found to have COVID-19 infection.    Her husband had COVID-19 infection approximately 3 weeks ago-patient started having symptoms then-which mostly involved mild URI and diarrhea.  COVID-19 vaccinated status: Vaccinated  Significant Events: 3/18>> Admit to [facility] for Covid 19 infection/diarrhea/weakness  Significant studies: 3/18>>Chest x-ray: Bilateral pulmonary infiltrates.  COVID-19 medications: Steroids:3/20  Antibiotics: None  Microbiology data: 3/18 >>blood culture: No growth  Procedures: None  Consults: None  DVT prophylaxis:  apixaban (ELIQUIS) tablet 5 mg     Subjective:   Feels much better.  Her heart rate is still uncontrolled-she wants me to talk to her cardiologist before adjusting medications any further.  She also wants me to talk to her oncologist before starting methimazole for hyperthyroidism.   Assessment  & Plan :   Gastroenteritis: Has resolved-no diarrhea since admission-stool GI pathogen panel/C. difficile PCR never sent.  Doubt any utility of any stool studies at this point.  Pneumonia: Unclear whether this is COVID-19 related pneumonitis/inflammation or superimposed bacterial pneumonia.  Procalcitonin mildly elevated-but starting to downtrend.  Remains on Levaquin-and tapering  steroids.  Cultures negative so far.    Sepsis: Due to PNA/gastroenteritis-cultures negative so far-sepsis physiology has significantly improved  PAF with RVR: Continues to have RVR-have increased metoprolol to 50 mg twice daily.  Continue Eliquis.  At her request-have consulted cardiology.  Appears to have hyperthyroidism-starting methimazole.  Hyperthyroidism: Appears to be a new diagnosis-likely may have caused her RVR/diarrhea.  Starting methimazole.  Her leukopenia/thrombocytopenia has improved-briefly touched base with primary oncologist-Dr. Magrinat on 3/21-okay to start methimazole-he will make an appointment for her in April so that her CBC can be monitored closely.  Patient will need to be followed by endocrinology in the outpatient setting.  Leukopenia/thrombocytopenia: Probably due to COVID-19 infection-however has CLL-has improved.  See above regarding discussion with primary oncologist.  Hyponatremia: Improved-and now very mild.  Etiology due to fluid loss due to diarrhea.  Follow.    Mildly prolonged QTC: Improved-K/Mg stable.    Orthostatic hypotension: Seems to have resolved-no symptoms when walking.  History of multiple sclerosis-at baseline ambulatory with a walker.  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  Obesity: Estimated body mass index is 33.59 kg/m as calculated from the following:   Height as of this encounter: 5\' 6"  (1.676 m).   Weight as of this encounter: 94.4 kg.    ABG: No results found for: PHART, PCO2ART, PO2ART, HCO3, TCO2, ACIDBASEDEF, O2SAT  Vent Settings: N/A  Condition - Stable  Family Communication  :Daughter-Tracy-[phone] updated on 3/21  Code Status :  Full Code  Diet :  Diet Order            Diet Heart Room service appropriate? Yes; Fluid consistency: Thin  Diet effective now                  Disposition Plan  :   Status is: Inpatient  Remains inpatient appropriate because:Inpatient  level of care appropriate due to severity of illness   Dispo: The patient is from: Home              Anticipated d/c is to: Home              Patient currently is not medically stable to d/c.   Difficult to place patient No    Barriers to discharge: Pneumonia-on IV steroids/IV antibiotics-now with A. fib with RVR.  Antimicorbials  :    Anti-infectives (From admission, onward)   Start     Dose/Rate Route Frequency Ordered Stop   04/07/20 0800  levofloxacin (LEVAQUIN) IVPB 750 mg        750 mg 100 mL/hr over 90 Minutes Intravenous Every 24 hours 04/07/20 0650 04/12/20 0759      Inpatient Medications  Scheduled Meds: . apixaban  5 mg Oral BID  . vitamin C  500 mg Oral Daily  . famotidine  20 mg Oral BID  . feeding supplement  237 mL Oral BID BM  . insulin aspart  0-9 Units Subcutaneous TID WC  . methimazole  5 mg Oral BID  . metoprolol tartrate  50 mg Oral BID  . [START ON 04/09/2020] predniSONE  40 mg Oral Q breakfast  . zinc sulfate  220 mg Oral Daily   Continuous Infusions: . levofloxacin (LEVAQUIN) IV 750 mg (04/08/20 0806)   PRN Meds:.acetaminophen, albuterol, loperamide, metoprolol tartrate, trimethobenzamide   Time Spent in minutes  25  See all Orders from today for further details   Oren Binet M.D on 04/08/2020 at 11:18 AM  To page go to [email or web address] - use universal password  [facility] -  Office  [phone]    Objective:   Vitals:   04/07/20 2109 04/08/20 0012 04/08/20 0418 04/08/20 0743  BP: 120/69 119/75 128/82 116/77  Pulse: (!) 120 (!) 127 (!) 133 (!) 126  Resp: (!) 27 (!) 26 (!) 26 (!) 30  Temp: 100.3 F (37.9 C) 97.6 F (36.4 C) 97.6 F (36.4 C) 97.6 F (36.4 C)  TempSrc: Oral Oral Oral Oral  SpO2: 92% 94% 94% 93%  Weight:   94.4 kg   Height:  Wt Readings from Last 3 Encounters:  04/08/20 94.4 kg  11/27/19 95.1 kg  10/11/19 95.6 kg     Intake/Output Summary (Last 24 hours) at 04/08/2020 1118 Last data filed  at 04/08/2020 0500 Gross per 24 hour  Intake 270 ml  Output -  Net 270 ml     Physical Exam Gen Exam:Alert awake-not in any distress HEENT:atraumatic, normocephalic Chest: B/L clear to auscultation anteriorly CVS:S1S2 regular Abdomen:soft non tender, non distended Extremities:no edema Neurology: Non focal-but with generalized weakness. Skin: no rash   Data Review:    CBC Recent Labs  Lab 04/04/20 2110 04/06/20 0241 04/07/20 0228 04/08/20 0412  WBC 5.4 2.7* 1.7* 4.9  HGB 13.3 12.2 12.7 12.7  HCT 38.2 35.3* 37.1 37.7  PLT 101* 77* 78* 97*  MCV 79.7* 81.5 81.0 82.3  MCH 27.8 28.2 27.7 27.7  MCHC 34.8 34.6 34.2 33.7  RDW 14.3 14.6 14.9 15.1  LYMPHSABS  --  0.7  --  1.4  MONOABS  --  0.3  --  0.3  EOSABS  --  0.0  --  0.0  BASOSABS  --  0.0  --  0.0    Chemistries  Recent Labs  Lab 04/04/20 2110 04/06/20 0241 04/07/20 0228 04/08/20 0412  NA 127* 134* 133* 133*  K 3.2* 3.9 4.0 4.7  CL 97* 102 102 102  CO2 21* 25 22 23   GLUCOSE 135* 135* 225* 238*  BUN 8 6* 11 13  CREATININE 0.76 0.66 0.58 0.60  CALCIUM 8.3* 8.2* 8.5* 8.6*  MG  --  1.9 2.4 2.3  AST 49* 23 18 21   ALT 36 27 26 26   ALKPHOS 45 41 40 41  BILITOT 1.4* 0.7 0.6 0.6   ------------------------------------------------------------------------------------------------------------------ No results for input(s): CHOL, HDL, LDLCALC, TRIG, CHOLHDL, LDLDIRECT in the last 72 hours.  No results found for: HGBA1C ------------------------------------------------------------------------------------------------------------------ Recent Labs    04/07/20 0314  TSH 0.121*   ------------------------------------------------------------------------------------------------------------------ No results for input(s): VITAMINB12, FOLATE, FERRITIN, TIBC, IRON, RETICCTPCT in the last 72 hours.  Coagulation profile No results for input(s): INR, PROTIME in the last 168 hours.  Recent Labs    04/07/20 0228  04/08/20 0412  DDIMER 1.54* 0.73*    Cardiac Enzymes No results for input(s): CKMB, TROPONINI, MYOGLOBIN in the last 168 hours.  Invalid input(s): CK ------------------------------------------------------------------------------------------------------------------    Component Value Date/Time   BNP 282.2 (H) 04/05/2020 1245    Micro Results Recent Results (from the past 240 hour(s))  Culture, blood (Routine X 2) w Reflex to ID Panel     Status: None (Preliminary result)   Collection Time: 04/05/20  9:50 AM   Specimen: BLOOD  Result Value Ref Range Status   Specimen Description BLOOD RIGHT ANTECUBITAL  Final   Special Requests   Final    BOTTLES DRAWN AEROBIC ONLY Blood Culture results may not be optimal due to an inadequate volume of blood received in culture bottles   Culture   Final    NO GROWTH 3 DAYS Performed at [facility], [place] [address], [place], [place] [address]    Report Status PENDING  Incomplete  Culture, blood (Routine X 2) w Reflex to ID Panel     Status: None (Preliminary result)   Collection Time: 04/05/20  9:59 AM   Specimen: BLOOD RIGHT HAND  Result Value Ref Range Status   Specimen Description BLOOD RIGHT HAND  Final   Special Requests   Final    BOTTLES DRAWN AEROBIC ONLY Blood Culture  results may not be optimal due to an inadequate volume of blood received in culture bottles   Culture   Final    NO GROWTH 3 DAYS Performed at [facility], [place] [address], [place], [place] [address]    Report Status PENDING  Incomplete    Radiology Reports DG Chest Port 1 View  Result Date: 04/07/2020 CLINICAL DATA:  Shortness of breath. EXAM: PORTABLE CHEST 1 VIEW COMPARISON:  Prior chest radiograph 04/05/2020 and earlier. FINDINGS: Mild cardiomegaly, unchanged. Aortic atherosclerosis. Patchy bilateral airspace disease, greater on the right, increased from the prior examination. Persistent small left pleural effusion. No evidence of pneumothorax  IMPRESSION: Patchy bilateral airspace disease, greater on the right, increased from the prior examination. Persistent small left pleural effusion. Unchanged cardiomegaly. Electronically Signed   By: Kellie Simmering DO   On: 04/07/2020 08:01   DG Chest Port 1 View  Result Date: 04/05/2020 CLINICAL DATA:  Sob,COVID positive EXAM: PORTABLE CHEST 1 VIEW COMPARISON:  04/05/2019 FINDINGS: Stable cardiac silhouette. Normal increase in mild airspace disease. Mild increased central venous congestion. Small RIGHT effusion. IMPRESSION: Subtle increase in pulmonary airspace disease. Findings concerning for early pneumonia versus pulmonary edema. Electronically Signed   By: Suzy Bouchard M.D.   On: 04/05/2020 07:38   ECHOCARDIOGRAM COMPLETE  Result Date: 04/07/2020    ECHOCARDIOGRAM REPORT   Patient Name:   Tasha [name] Date of Exam: 04/06/2020 Medical Rec #:  [id number]     Height:       66.0 in Accession #:    [id number]    Weight:       207.2 lb Date of Birth:  1941/01/07      BSA:          2.030 m Patient Age:    48 years      BP:           116/79 mmHg Patient Gender: F             HR:           109 bpm. Exam Location:  Inpatient Procedure: 2D Echo, Cardiac Doppler and Color Doppler Indications:    Acute Respiratory Distress  History:        Patient has prior history of Echocardiogram examinations, most                 recent 11/01/2017. Covid 19 positive.  Sonographer:    Merrie Roof RDCS Referring Phys: [place]  1. Left ventricular ejection fraction, by estimation, is 65 to 70%. The left ventricle has normal function. The left ventricle has no regional wall motion abnormalities. Left ventricular diastolic function could not be evaluated.  2. Right ventricular systolic function is mildly reduced. The right ventricular size is mildly enlarged. There is mildly elevated pulmonary artery systolic pressure. The estimated right ventricular systolic pressure is 57.3 mmHg.  3. Left atrial size was  mildly dilated.  4. Right atrial size was mildly dilated.  5. The mitral valve was not assessed. No evidence of mitral valve regurgitation. No evidence of mitral stenosis.  6. The aortic valve was not assessed. Aortic valve regurgitation is not visualized. No aortic stenosis is present.  7. The inferior vena cava is normal in size with greater than 50% respiratory variability, suggesting right atrial pressure of 3 mmHg. FINDINGS  Left Ventricle: Left ventricular ejection fraction, by estimation, is 65 to 70%. The left ventricle has normal function. The left ventricle has no regional wall  motion abnormalities. The left ventricular internal cavity size was normal in size. There is  no left ventricular hypertrophy. Left ventricular diastolic function could not be evaluated. Right Ventricle: The right ventricular size is mildly enlarged. No increase in right ventricular wall thickness. Right ventricular systolic function is mildly reduced. There is mildly elevated pulmonary artery systolic pressure. The tricuspid regurgitant  velocity is 2.77 m/s, and with an assumed right atrial pressure of 3 mmHg, the estimated right ventricular systolic pressure is 58.8 mmHg. Left Atrium: Left atrial size was mildly dilated. Right Atrium: Right atrial size was mildly dilated. Pericardium: There is no evidence of pericardial effusion. Mitral Valve: The mitral valve was not assessed. No evidence of mitral valve regurgitation. No evidence of mitral valve stenosis. Tricuspid Valve: The tricuspid valve is normal in structure. Tricuspid valve regurgitation is mild . No evidence of tricuspid stenosis. Aortic Valve: The aortic valve was not assessed. Aortic valve regurgitation is not visualized. No aortic stenosis is present. Pulmonic Valve: The pulmonic valve was normal in structure. Pulmonic valve regurgitation is not visualized. No evidence of pulmonic stenosis. Aorta: The aortic root is normal in size and structure. Venous: The inferior  vena cava is normal in size with greater than 50% respiratory variability, suggesting right atrial pressure of 3 mmHg. IAS/Shunts: No atrial level shunt detected by color flow Doppler.  RIGHT VENTRICLE RV Basal diam:  3.30 cm LEFT ATRIUM              Index       RIGHT ATRIUM           Index LA Vol (A2C):   125.0 ml 61.57 ml/m RA Area:     22.20 cm LA Vol (A4C):   77.1 ml  37.98 ml/m RA Volume:   75.00 ml  36.94 ml/m LA Biplane Vol: 99.5 ml  49.01 ml/m  TRICUSPID VALVE TR Peak grad:   30.7 mmHg TR Vmax:        277.00 cm/s Ena Dawley MD Electronically signed by Ena Dawley MD Signature Date/Time: 04/07/2020/12:14:58 PM    Final

## 2020-04-08 NOTE — Progress Notes (Signed)
Occupational Therapy Treatment Patient Details Name: Tasha [name] MRN: [id number] DOB: 1940-07-28 Today's Date: 04/08/2020    History of present illness Pt is a 80 y.o. female admitted 04/04/20 with c/o diarrhea and weakness. CXR concerning for early PNA or edema. Workup for gastroenteritis secondary to COVID-19, sepsis. PMH includes afib, Charcot-Marie disease, peripheral neuropathy, Bell's palsy, Bouchard nodes, R foot drop.   OT comments  Pt progressing towards OT goals, able to demo short mobility in room at [place] A x 2 for safety but continues to require increased assist for initial sit to stand transfers with RW at Mod A x 2 with elevated bed height. Pt able to demo positioning techniques to assist with donning socks/shoes but continues to require Max A for LB ADLs. Pt reports her husband is in good health and will be able to assist as needed at home. HR variable but up to 151bpm during session.   Follow Up Recommendations  Home health OT;Supervision/Assistance - 24 hour    Equipment Recommendations  None recommended by OT    Recommendations for Other Services      Precautions / Restrictions Precautions Precautions: Fall;Other (comment) Precaution Comments: R foot drop, likes her shoes donned, watch BP Restrictions Weight Bearing Restrictions: No Other Position/Activity Restrictions: Pt has AFO at home but reports she does not typically wear it.       Mobility Bed Mobility Overal bed mobility: Needs Assistance Bed Mobility: Supine to Sit     Supine to sit: Min assist;HOB elevated     General bed mobility comments: +rail, increased time, assist to elevate trunk and scoot to EOB    Transfers Overall transfer level: Needs assistance Equipment used: Rolling walker (2 wheeled) Transfers: Sit to/from Stand Sit to Stand: Mod assist;+2 physical assistance;+2 safety/equipment;From elevated surface         General transfer comment: assist to power up. Pt pulling up both  hands on RW. Reports pushing up from bed does not work for her.    Balance Overall balance assessment: Needs assistance Sitting-balance support: No upper extremity supported;Feet supported Sitting balance-Leahy Scale: Fair Sitting balance - Comments: initially poor with posterior lean and UE support, progressed to fair   Standing balance support: Bilateral upper extremity supported;During functional activity Standing balance-Leahy Scale: Poor Standing balance comment: reliant on B UE support of walker and min external assist                           ADL either performed or assessed with clinical judgement   ADL Overall ADL's : Needs assistance/impaired                     Lower Body Dressing: Maximal assistance;Sit to/from stand Lower Body Dressing Details (indicate cue type and reason): With L LE propped on bed, pt able to don sock and partially don shoe, required assist to get around heel and don R sock/tennis shoe             Functional mobility during ADLs: Minimal assistance;+2 for safety/equipment;Rolling walker;Cueing for sequencing;Cueing for safety General ADL Comments: pt fatigues easily     Vision   Vision Assessment?: No apparent visual deficits   Perception     Praxis      Cognition Arousal/Alertness: Awake/alert Behavior During Therapy: Flat affect Overall Cognitive Status: Impaired/Different from baseline Area of Impairment: Problem solving  Problem Solving: Slow processing;Decreased initiation General Comments: tangential speech, difficulty staying on task        Exercises     Shoulder Instructions       General Comments HR 124-151. SpO2 > 90% on RA.    Pertinent Vitals/ Pain       Pain Assessment: No/denies pain  Home Living                                          Prior Functioning/Environment              Frequency  Min 2X/week        Progress  Toward Goals  OT Goals(current goals can now be found in the care plan section)  Progress towards OT goals: Progressing toward goals  Acute Rehab OT Goals Patient Stated Goal: return home with husband's assist OT Goal Formulation: With patient Time For Goal Achievement: 04/20/20 Potential to Achieve Goals: Good ADL Goals Pt Will Perform Grooming: with min guard assist;standing Pt Will Perform Lower Body Bathing: with min assist;sit to/from stand Pt Will Perform Lower Body Dressing: with min assist;sit to/from stand Pt Will Transfer to Toilet: with min guard assist;ambulating;bedside commode Pt Will Perform Toileting - Clothing Manipulation and hygiene: with min guard assist;sit to/from stand Additional ADL Goal #1: Pt will employ energy conservation strategies in ADL with minimal verbal cues. Additional ADL Goal #2: Pt will perform bed mobility modified independently in preparation for ADL.  Plan Discharge plan remains appropriate    Co-evaluation    PT/OT/SLP Co-Evaluation/Treatment: Yes Reason for Co-Treatment: For patient/therapist safety;To address functional/ADL transfers PT goals addressed during session: Mobility/safety with mobility;Balance;Proper use of DME OT goals addressed during session: ADL's and self-care      AM-PAC OT "6 Clicks" Daily Activity     Outcome Measure   Help from another person eating meals?: None Help from another person taking care of personal grooming?: A Little Help from another person toileting, which includes using toliet, bedpan, or urinal?: A Little Help from another person bathing (including washing, rinsing, drying)?: A Lot Help from another person to put on and taking off regular upper body clothing?: A Little Help from another person to put on and taking off regular lower body clothing?: A Lot 6 Click Score: 17    End of Session Equipment Utilized During Treatment: Gait belt;Rolling walker  OT Visit Diagnosis: Unsteadiness on feet  (R26.81);Other abnormalities of gait and mobility (R26.89);Muscle weakness (generalized) (M62.81);Other symptoms and signs involving cognitive function   Activity Tolerance Patient limited by fatigue   Patient Left in chair;with call bell/phone within reach   Nurse Communication Mobility status        Time: 2703-5009 OT Time Calculation (min): 30 min  Charges: OT General Charges $OT Visit: 1 Visit OT Treatments $Self Care/Home Management : 8-22 mins  Malachy Chamber, OTR/L Acute Rehab Services Office: [phone]   Layla Maw 04/08/2020, 1:18 PM

## 2020-04-08 NOTE — Progress Notes (Signed)
Physical Therapy Treatment Patient Details Name: Tasha [name] MRN: [id number] DOB: Sep 18, 1940 Today's Date: 04/08/2020    History of Present Illness Pt is a 80 y.o. female admitted 04/04/20 with c/o diarrhea and weakness. CXR concerning for early PNA or edema. Workup for gastroenteritis secondary to COVID-19, sepsis. PMH includes afib, Charcot-Marie disease, peripheral neuropathy, Bell's palsy, Bouchard nodes, R foot drop.    PT Comments    Pt received in bed, agreeable to participation in therapy. She required min assist bed mobility, +2 mod assist sit to stand, and +2 min assist ambulation 20' with RW. Pt presents with slow, labored gait. Fatigues quickly. Pt in recliner with feet elevated at end of session.    Follow Up Recommendations  Home health PT;Supervision for mobility/OOB     Equipment Recommendations  None recommended by PT    Recommendations for Other Services       Precautions / Restrictions Precautions Precautions: Fall;Other (comment) Precaution Comments: R foot drop, likes her shoes donned, watch BP Restrictions Other Position/Activity Restrictions: Pt has AFO at home but reports she does not typically wear it.    Mobility  Bed Mobility Overal bed mobility: Needs Assistance Bed Mobility: Supine to Sit     Supine to sit: Min assist;HOB elevated     General bed mobility comments: +rail, increased time, assist to elevate trunk and scoot to EOB    Transfers Overall transfer level: Needs assistance Equipment used: Rolling walker (2 wheeled) Transfers: Sit to/from Stand Sit to Stand: +2 physical assistance;+2 safety/equipment;Mod assist         General transfer comment: assist to power up. Pt pulling up both hands on RW. Reports pushing up from bed does not work for her.  Ambulation/Gait Ambulation/Gait assistance: Min assist;+2 safety/equipment;+2 physical assistance Gait Distance (Feet): 20 Feet Assistive device: Rolling walker (2 wheeled) Gait  Pattern/deviations: Decreased stride length;Step-to pattern;Decreased stance time - right;Steppage Gait velocity: Decreased Gait velocity interpretation: <1.8 ft/sec, indicate of risk for recurrent falls General Gait Details: slow, labored gait requiring increased time   Stairs             Wheelchair Mobility    Modified Rankin (Stroke Patients Only)       Balance Overall balance assessment: Needs assistance Sitting-balance support: No upper extremity supported;Feet supported Sitting balance-Leahy Scale: Fair     Standing balance support: Bilateral upper extremity supported;During functional activity Standing balance-Leahy Scale: Poor Standing balance comment: reliant on B UE support of walker and min external assist                            Cognition Arousal/Alertness: Awake/alert Behavior During Therapy: Flat affect Overall Cognitive Status: Impaired/Different from baseline Area of Impairment: Problem solving                             Problem Solving: Slow processing;Decreased initiation General Comments: tangential speech, difficulty staying on task      Exercises      General Comments General comments (skin integrity, edema, etc.): HR 124-151. SpO2 > 90% on RA.      Pertinent Vitals/Pain Pain Assessment: No/denies pain    Home Living                      Prior Function            PT Goals (current goals can now be found in  the care plan section) Acute Rehab PT Goals Patient Stated Goal: return home with husband's assist Progress towards PT goals: Progressing toward goals    Frequency    Min 3X/week      PT Plan Current plan remains appropriate    Co-evaluation PT/OT/SLP Co-Evaluation/Treatment: Yes Reason for Co-Treatment: For patient/therapist safety;To address functional/ADL transfers PT goals addressed during session: Mobility/safety with mobility;Balance;Proper use of DME        AM-PAC PT "6  Clicks" Mobility   Outcome Measure  Help needed turning from your back to your side while in a flat bed without using bedrails?: A Little Help needed moving from lying on your back to sitting on the side of a flat bed without using bedrails?: A Little Help needed moving to and from a bed to a chair (including a wheelchair)?: A Lot Help needed standing up from a chair using your arms (e.g., wheelchair or bedside chair)?: A Lot Help needed to walk in hospital room?: A Little Help needed climbing 3-5 steps with a railing? : A Lot 6 Click Score: 15    End of Session Equipment Utilized During Treatment: Gait belt Activity Tolerance: Patient tolerated treatment well Patient left: in chair;with call bell/phone within reach Nurse Communication: Mobility status PT Visit Diagnosis: Other abnormalities of gait and mobility (R26.89);Muscle weakness (generalized) (M62.81)     Time: 0932-6712 PT Time Calculation (min) (ACUTE ONLY): 25 min  Charges:  $Gait Training: 8-22 mins                     Lorrin Goodell, PT  Office # [phone] Pager [phone]    Tasha [name] 04/08/2020, 12:41 PM

## 2020-04-09 ENCOUNTER — Ambulatory Visit: Payer: Medicare Other | Admitting: Cardiovascular Disease

## 2020-04-09 DIAGNOSIS — U071 COVID-19: Secondary | ICD-10-CM | POA: Diagnosis not present

## 2020-04-09 DIAGNOSIS — A0839 Other viral enteritis: Secondary | ICD-10-CM | POA: Diagnosis not present

## 2020-04-09 LAB — CBC
HCT: 42.2 % (ref 36.0–46.0)
Hemoglobin: 14.1 g/dL (ref 12.0–15.0)
MCH: 27.6 pg (ref 26.0–34.0)
MCHC: 33.4 g/dL (ref 30.0–36.0)
MCV: 82.6 fL (ref 80.0–100.0)
Platelets: 126 10*3/uL — ABNORMAL LOW (ref 150–400)
RBC: 5.11 MIL/uL (ref 3.87–5.11)
RDW: 15.3 % (ref 11.5–15.5)
WBC: 6.8 10*3/uL (ref 4.0–10.5)
nRBC: 0 % (ref 0.0–0.2)

## 2020-04-09 LAB — COMPREHENSIVE METABOLIC PANEL
ALT: 35 U/L (ref 0–44)
AST: 33 U/L (ref 15–41)
Albumin: 2.5 g/dL — ABNORMAL LOW (ref 3.5–5.0)
Alkaline Phosphatase: 45 U/L (ref 38–126)
Anion gap: 7 (ref 5–15)
BUN: 14 mg/dL (ref 8–23)
CO2: 27 mmol/L (ref 22–32)
Calcium: 8.7 mg/dL — ABNORMAL LOW (ref 8.9–10.3)
Chloride: 101 mmol/L (ref 98–111)
Creatinine, Ser: 0.79 mg/dL (ref 0.44–1.00)
GFR, Estimated: >60 mL/min (ref >60)
Glucose, Bld: 134 mg/dL — ABNORMAL HIGH (ref 70–99)
Potassium: 4.3 mmol/L (ref 3.5–5.1)
Sodium: 135 mmol/L (ref 135–145)
Total Bilirubin: 0.6 mg/dL (ref 0.3–1.2)
Total Protein: 5.5 g/dL — ABNORMAL LOW (ref 6.5–8.1)

## 2020-04-09 LAB — GLUCOSE, CAPILLARY
Glucose-Capillary: 117 mg/dL — ABNORMAL HIGH (ref 70–99)
Glucose-Capillary: 175 mg/dL — ABNORMAL HIGH (ref 70–99)
Glucose-Capillary: 187 mg/dL — ABNORMAL HIGH (ref 70–99)
Glucose-Capillary: 239 mg/dL — ABNORMAL HIGH (ref 70–99)

## 2020-04-09 LAB — MAGNESIUM: Magnesium: 2.1 mg/dL (ref 1.7–2.4)

## 2020-04-09 LAB — PHOSPHORUS: Phosphorus: 2.8 mg/dL (ref 2.5–4.6)

## 2020-04-09 LAB — D-DIMER, QUANTITATIVE: D-Dimer, Quant: 1.01 ug/mL-FEU — ABNORMAL HIGH (ref 0.00–0.50)

## 2020-04-09 LAB — C-REACTIVE PROTEIN: CRP: 5 mg/dL — ABNORMAL HIGH (ref <1.0)

## 2020-04-09 MED ORDER — DIGOXIN 125 MCG PO TABS
0.1250 mg | ORAL_TABLET | Freq: Once | ORAL | Status: AC
  Administered 2020-04-09: 0.125 mg via ORAL
  Filled 2020-04-09: qty 1

## 2020-04-09 MED ORDER — DIGOXIN 125 MCG PO TABS
0.1250 mg | ORAL_TABLET | Freq: Every day | ORAL | Status: DC
  Administered 2020-04-09 – 2020-04-12 (×4): 0.125 mg via ORAL
  Filled 2020-04-09 (×4): qty 1

## 2020-04-09 MED ORDER — FUROSEMIDE 10 MG/ML IJ SOLN
20.0000 mg | Freq: Once | INTRAMUSCULAR | Status: AC
  Administered 2020-04-09: 20 mg via INTRAVENOUS
  Filled 2020-04-09: qty 2

## 2020-04-09 MED ORDER — PREDNISONE 5 MG PO TABS
30.0000 mg | ORAL_TABLET | Freq: Every day | ORAL | Status: DC
  Administered 2020-04-09: 30 mg via ORAL
  Filled 2020-04-09: qty 1

## 2020-04-09 MED ORDER — DIGOXIN 125 MCG PO TABS
0.2500 mg | ORAL_TABLET | Freq: Once | ORAL | Status: AC
  Administered 2020-04-09: 0.25 mg via ORAL
  Filled 2020-04-09: qty 2

## 2020-04-09 NOTE — Progress Notes (Signed)
Progress Note  Patient Name: Tasha [name] Date of Encounter: 04/09/2020  [facility] Cardiologist: Sanda Klein, MD   Subjective   Feels a little bit more tired today.  Inpatient Medications    Scheduled Meds: . apixaban  5 mg Oral BID  . vitamin C  500 mg Oral Daily  . digoxin  0.125 mg Oral Daily  . famotidine  20 mg Oral BID  . feeding supplement  237 mL Oral BID BM  . insulin aspart  0-9 Units Subcutaneous TID WC  . methimazole  5 mg Oral BID  . metoprolol tartrate  50 mg Oral TID  . predniSONE  30 mg Oral Q breakfast  . zinc sulfate  220 mg Oral Daily   Continuous Infusions: . levofloxacin (LEVAQUIN) IV Stopped (04/08/20 1735)   PRN Meds: acetaminophen, albuterol, loperamide, metoprolol tartrate, trimethobenzamide   Vital Signs    Vitals:   04/09/20 0246 04/09/20 0328 04/09/20 0637 04/09/20 0736  BP: 124/68 129/72 125/65 112/66  Pulse:  (!) 109 (!) 134 (!) 115  Resp: (!) 28 19 (!) 26 (!) 23  Temp: 98.6 F (37 C) 98.1 F (36.7 C) 98.2 F (36.8 C) 97.6 F (36.4 C)  TempSrc:  Oral Oral Oral  SpO2: 96% 93% 97% 97%  Weight:   92.2 kg   Height:        Intake/Output Summary (Last 24 hours) at 04/09/2020 0915 Last data filed at 04/08/2020 1836 Gross per 24 hour  Intake 480 ml  Output 600 ml  Net -120 ml   Last 3 Weights 04/09/2020 04/08/2020 04/07/2020  Weight (lbs) 203 lb 4.2 oz 208 lb 1.8 oz 205 lb 14.6 oz  Weight (kg) 92.2 kg 94.4 kg 93.4 kg      Telemetry    Currently in the 120s to 140s A. fib- Personally Reviewed  ECG    No new- Personally Reviewed  Physical Exam   GEN: No acute distress.   Neck: No JVD Cardiac:  Tachycardic irregular , no murmurs, rubs, or gallops.  Respiratory: Clear to auscultation bilaterally. GI: Soft, nontender, non-distended  MS: No edema; No deformity. Neuro:  Nonfocal  Psych: Normal affect   Labs    High Sensitivity Troponin:  No results for input(s): TROPONINIHS in the last 720 hours.     Chemistry Recent Labs  Lab 04/07/20 0228 04/08/20 0412 04/09/20 0341  NA 133* 133* 135  K 4.0 4.7 4.3  CL 102 102 101  CO2 22 23 27   GLUCOSE 225* 238* 134*  BUN 11 13 14   CREATININE 0.58 0.60 0.79  CALCIUM 8.5* 8.6* 8.7*  PROT 4.9* 4.7* 5.5*  ALBUMIN 2.3* 2.2* 2.5*  AST 18 21 33  ALT 26 26 35  ALKPHOS 40 41 45  BILITOT 0.6 0.6 0.6  GFRNONAA >60 >60 >60  ANIONGAP 9 8 7      Hematology Recent Labs  Lab 04/07/20 0228 04/08/20 0412 04/09/20 0341  WBC 1.7* 4.9 6.8  RBC 4.58 4.58 5.11  HGB 12.7 12.7 14.1  HCT 37.1 37.7 42.2  MCV 81.0 82.3 82.6  MCH 27.7 27.7 27.6  MCHC 34.2 33.7 33.4  RDW 14.9 15.1 15.3  PLT 78* 97* 126*    BNP Recent Labs  Lab 04/05/20 0939  BNP 282.2*     DDimer  Recent Labs  Lab 04/07/20 0228 04/08/20 0412 04/09/20 0341  DDIMER 1.54* 0.73* 1.01*     Radiology    No results found.  Cardiac Studies   EF 65-70  Patient Profile     80 y.o. female with a hx of PAF, CLL with mild thrombocytopenia, MS, and peripheral neuropathy who is being seen today for the evaluation of afib with RVR in the setting of COVID  Assessment & Plan    Atrial fibrillation with rapid ventricular response - increased metoprolol to 50, 3 times a day -We will add digoxin 0.125 mg daily -Hopefully as pneumonia and hyperthyroidism improves A. fib will improve as well.  Hyperthyroidism -Also making atrial fibrillation worse. -Methimazole  Covid pneumonia -Per primary team.  Thrombocytopenia -Has underlying CLL.  Hematology following.    For questions or updates, please contact [place] Please consult [email or web address] for contact info under        Signed, Candee Furbish, MD  04/09/2020, 9:15 AM

## 2020-04-09 NOTE — Progress Notes (Addendum)
PROGRESS NOTE                                                                                                                                                                                                             Patient Demographics:    Tasha [name], is a 80 y.o. female, DOB - [birth year], JGO:[id number]  Outpatient Primary MD for the patient is Lavone Orn, MD   Admit date - 04/04/2020   LOS - 4  Chief Complaint  Patient presents with   Nausea   Diarrhea       Brief Narrative: Patient is a 80 y.o. female with PMHx of multiple sclerosis, CLL, PAF-who presented with weakness and diarrhea-she was found to have COVID-19 infection.    Her husband had COVID-19 infection approximately 3 weeks ago-patient started having symptoms then-which mostly involved mild URI and diarrhea.  COVID-19 vaccinated status: Vaccinated  Significant Events: 3/18>> Admit to [facility] for Covid 19 infection/diarrhea/weakness  Significant studies: 3/18>>Chest x-ray: Bilateral pulmonary infiltrates.  COVID-19 medications: Steroids:3/20  Antibiotics: Levaquin:3/20>>  Microbiology data: 3/18 >>blood culture: No growth  Procedures: None  Consults: None  DVT prophylaxis:  apixaban (ELIQUIS) tablet 5 mg     Subjective:   Feels much better.  Her heart rate is still uncontrolled-she wants me to talk to her cardiologist before adjusting medications any further.  She also wants me to talk to her oncologist before starting methimazole for hyperthyroidism.   Assessment  & Plan :   Gastroenteritis: Has resolved-no diarrhea since admission-stool GI pathogen panel/C. difficile PCR never sent.  Doubt any utility of any stool studies at this point.  Pneumonia: Unclear whether this is COVID-19 related pneumonitis/inflammation or superimposed bacterial pneumonia.  Procalcitonin mildly elevated-but starting to downtrend.  Remains on  Levaquin-and tapering steroids.  Cultures negative so far.    Sepsis: Due to PNA/gastroenteritis-cultures negative so far-sepsis physiology has significantly improved  PAF with RVR: Still in RVR but rate controlled better-on metoprolol-cardiology has added digoxin.  Remains on Eliquis.  Could have been provoked by hyperthyroidism and PNA.  Hyperthyroidism: Appears to be a new diagnosis-likely may have caused her RVR/diarrhea.  Stable on methimazole-spoke with her oncologist-Dr. Magrinat on 3/21-patient has an appointment in April 2022 for repeat CBC.  Have discussed with patient's daughter over the past few days regarding outpatient follow-up with endocrinology for further  definitive treatment.  Leukopenia/thrombocytopenia: Probably due to COVID-19 infection-however has CLL-has improved.  See above regarding discussion with primary oncologist.  Hyponatremia: Improved-and now very mild.  Etiology due to fluid loss due to diarrhea.  Follow.    Mildly prolonged QTC: Improved-K/Mg stable.    Orthostatic hypotension: Seems to have resolved-no symptoms when walking.  History of multiple sclerosis-at baseline ambulatory with a walker.  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  Obesity: Estimated body mass index is 32.81 kg/m as calculated from the following:   Height as of this encounter: 5\' 6"  (1.676 m).   Weight as of this encounter: 92.2 kg.    ABG: No results found for: PHART, PCO2ART, PO2ART, HCO3, TCO2, ACIDBASEDEF, O2SAT  Vent Settings: N/A  Condition - Stable  Family Communication  :Daughter-Tracy-[phone] updated on 3/22  Code Status :  Full Code  Diet :  Diet Order            Diet Heart Room service appropriate? Yes; Fluid consistency: Thin  Diet effective now                  Disposition Plan  :   Status is: Inpatient  Remains inpatient appropriate because:Inpatient level of care appropriate due to severity of  illness   Dispo: The patient is from: Home              Anticipated d/c is to: Home              Patient currently is not medically stable to d/c.   Difficult to place patient No    Barriers to discharge: Pneumonia-on IV steroids/IV antibiotics-now with A. fib with RVR.  Antimicorbials  :    Anti-infectives (From admission, onward)   Start     Dose/Rate Route Frequency Ordered Stop   04/07/20 0800  levofloxacin (LEVAQUIN) IVPB 750 mg        750 mg 100 mL/hr over 90 Minutes Intravenous Every 24 hours 04/07/20 0650 04/12/20 0759      Inpatient Medications  Scheduled Meds:  apixaban  5 mg Oral BID   vitamin C  500 mg Oral Daily   digoxin  0.125 mg Oral Daily   digoxin  0.125 mg Oral Once   famotidine  20 mg Oral BID   feeding supplement  237 mL Oral BID BM   insulin aspart  0-9 Units Subcutaneous TID WC   methimazole  5 mg Oral BID   metoprolol tartrate  50 mg Oral TID   predniSONE  30 mg Oral Q breakfast   zinc sulfate  220 mg Oral Daily   Continuous Infusions:  levofloxacin (LEVAQUIN) IV Stopped (04/09/20 1200)   PRN Meds:.acetaminophen, albuterol, loperamide, metoprolol tartrate, trimethobenzamide   Time Spent in minutes  25  See all Orders from today for further details   Oren Binet M.D on 04/09/2020 at 2:04 PM  To page go to [email or web address] - use universal password  [facility] -  Office  [phone]    Objective:   Vitals:   04/09/20 0637 04/09/20 0736 04/09/20 1218 04/09/20 1240  BP: 125/65 112/66 124/80   Pulse: (!) 134 (!) 115 (!) 140 (!) 162  Resp: (!) 26 (!) 23 17   Temp: 98.2 F (36.8 C) 97.6 F (36.4 C) 98 F (36.7 C)   TempSrc: Oral Oral Axillary   SpO2: 97% 97% 96%   Weight: 92.2 kg     Height:        Wt  Readings from Last 3 Encounters:  04/09/20 92.2 kg  11/27/19 95.1 kg  10/11/19 95.6 kg     Intake/Output Summary (Last 24 hours) at 04/09/2020 1404 Last data filed at 04/09/2020 1200 Gross per 24 hour   Intake 510 ml  Output 600 ml  Net -90 ml     Physical Exam Gen Exam:Alert awake-not in any distress HEENT:atraumatic, normocephalic Chest: B/L clear to auscultation anteriorly CVS:S1S2 regular Abdomen:soft non tender, non distended Extremities:no edema Neurology: Non focal Skin: no rash   Data Review:    CBC Recent Labs  Lab 04/04/20 2110 04/06/20 0241 04/07/20 0228 04/08/20 0412 04/09/20 0341  WBC 5.4 2.7* 1.7* 4.9 6.8  HGB 13.3 12.2 12.7 12.7 14.1  HCT 38.2 35.3* 37.1 37.7 42.2  PLT 101* 77* 78* 97* 126*  MCV 79.7* 81.5 81.0 82.3 82.6  MCH 27.8 28.2 27.7 27.7 27.6  MCHC 34.8 34.6 34.2 33.7 33.4  RDW 14.3 14.6 14.9 15.1 15.3  LYMPHSABS  --  0.7  --  1.4  --   MONOABS  --  0.3  --  0.3  --   EOSABS  --  0.0  --  0.0  --   BASOSABS  --  0.0  --  0.0  --     Chemistries  Recent Labs  Lab 04/04/20 2110 04/06/20 0241 04/07/20 0228 04/08/20 0412 04/09/20 0341  NA 127* 134* 133* 133* 135  K 3.2* 3.9 4.0 4.7 4.3  CL 97* 102 102 102 101  CO2 21* 25 22 23 27   GLUCOSE 135* 135* 225* 238* 134*  BUN 8 6* 11 13 14   CREATININE 0.76 0.66 0.58 0.60 0.79  CALCIUM 8.3* 8.2* 8.5* 8.6* 8.7*  MG  --  1.9 2.4 2.3 2.1  AST 49* 23 18 21  33  ALT 36 27 26 26  35  ALKPHOS 45 41 40 41 45  BILITOT 1.4* 0.7 0.6 0.6 0.6   ------------------------------------------------------------------------------------------------------------------ No results for input(s): CHOL, HDL, LDLCALC, TRIG, CHOLHDL, LDLDIRECT in the last 72 hours.  Lab Results  Component Value Date   HGBA1C 6.1 (H) 04/08/2020   ------------------------------------------------------------------------------------------------------------------ Recent Labs    04/07/20 0314  TSH 0.121*   ------------------------------------------------------------------------------------------------------------------ No results for input(s): VITAMINB12, FOLATE, FERRITIN, TIBC, IRON, RETICCTPCT in the last 72 hours.  Coagulation  profile No results for input(s): INR, PROTIME in the last 168 hours.  Recent Labs    04/08/20 0412 04/09/20 0341  DDIMER 0.73* 1.01*    Cardiac Enzymes No results for input(s): CKMB, TROPONINI, MYOGLOBIN in the last 168 hours.  Invalid input(s): CK ------------------------------------------------------------------------------------------------------------------    Component Value Date/Time   BNP 282.2 (H) 04/05/2020 1696    Micro Results Recent Results (from the past 240 hour(s))  Culture, blood (Routine X 2) w Reflex to ID Panel     Status: None (Preliminary result)   Collection Time: 04/05/20  9:50 AM   Specimen: BLOOD  Result Value Ref Range Status   Specimen Description BLOOD RIGHT ANTECUBITAL  Final   Special Requests   Final    BOTTLES DRAWN AEROBIC ONLY Blood Culture results may not be optimal due to an inadequate volume of blood received in culture bottles   Culture   Final    NO GROWTH 4 DAYS Performed at [facility], [place] [address] [place], [place] [address]    Report Status PENDING  Incomplete  Culture, blood (Routine X 2) w Reflex to ID Panel     Status: None (Preliminary result)  Collection Time: 04/05/20  9:59 AM   Specimen: BLOOD RIGHT HAND  Result Value Ref Range Status   Specimen Description BLOOD RIGHT HAND  Final   Special Requests   Final    BOTTLES DRAWN AEROBIC ONLY Blood Culture results may not be optimal due to an inadequate volume of blood received in culture bottles   Culture   Final    NO GROWTH 4 DAYS Performed at [facility], [place] [address], [place], [place] [address]    Report Status PENDING  Incomplete    Radiology Reports DG Chest Port 1 View  Result Date: 04/07/2020 CLINICAL DATA:  Shortness of breath. EXAM: PORTABLE CHEST 1 VIEW COMPARISON:  Prior chest radiograph 04/05/2020 and earlier. FINDINGS: Mild cardiomegaly, unchanged. Aortic atherosclerosis. Patchy bilateral airspace disease, greater on the right, increased  from the prior examination. Persistent small left pleural effusion. No evidence of pneumothorax IMPRESSION: Patchy bilateral airspace disease, greater on the right, increased from the prior examination. Persistent small left pleural effusion. Unchanged cardiomegaly. Electronically Signed   By: Kellie Simmering DO   On: 04/07/2020 08:01   DG Chest Port 1 View  Result Date: 04/05/2020 CLINICAL DATA:  Sob,COVID positive EXAM: PORTABLE CHEST 1 VIEW COMPARISON:  04/05/2019 FINDINGS: Stable cardiac silhouette. Normal increase in mild airspace disease. Mild increased central venous congestion. Small RIGHT effusion. IMPRESSION: Subtle increase in pulmonary airspace disease. Findings concerning for early pneumonia versus pulmonary edema. Electronically Signed   By: Suzy Bouchard M.D.   On: 04/05/2020 07:38   ECHOCARDIOGRAM COMPLETE  Result Date: 04/07/2020    ECHOCARDIOGRAM REPORT   Patient Name:   Tasha [name] Date of Exam: 04/06/2020 Medical Rec #:  [id number]     Height:       66.0 in Accession #:    [id number]    Weight:       207.2 lb Date of Birth:  [birth year]      BSA:          2.030 m Patient Age:    67 years      BP:           116/79 mmHg Patient Gender: F             HR:           109 bpm. Exam Location:  Inpatient Procedure: 2D Echo, Cardiac Doppler and Color Doppler Indications:    Acute Respiratory Distress  History:        Patient has prior history of Echocardiogram examinations, most                 recent 11/01/2017. Covid 19 positive.  Sonographer:    Merrie Roof RDCS Referring Phys: [place]  1. Left ventricular ejection fraction, by estimation, is 65 to 70%. The left ventricle has normal function. The left ventricle has no regional wall motion abnormalities. Left ventricular diastolic function could not be evaluated.  2. Right ventricular systolic function is mildly reduced. The right ventricular size is mildly enlarged. There is mildly elevated pulmonary artery systolic  pressure. The estimated right ventricular systolic pressure is 37.1 mmHg.  3. Left atrial size was mildly dilated.  4. Right atrial size was mildly dilated.  5. The mitral valve was not assessed. No evidence of mitral valve regurgitation. No evidence of mitral stenosis.  6. The aortic valve was not assessed. Aortic valve regurgitation is not visualized. No aortic stenosis is present.  7. The inferior vena cava is  normal in size with greater than 50% respiratory variability, suggesting right atrial pressure of 3 mmHg. FINDINGS  Left Ventricle: Left ventricular ejection fraction, by estimation, is 65 to 70%. The left ventricle has normal function. The left ventricle has no regional wall motion abnormalities. The left ventricular internal cavity size was normal in size. There is  no left ventricular hypertrophy. Left ventricular diastolic function could not be evaluated. Right Ventricle: The right ventricular size is mildly enlarged. No increase in right ventricular wall thickness. Right ventricular systolic function is mildly reduced. There is mildly elevated pulmonary artery systolic pressure. The tricuspid regurgitant  velocity is 2.77 m/s, and with an assumed right atrial pressure of 3 mmHg, the estimated right ventricular systolic pressure is 13.2 mmHg. Left Atrium: Left atrial size was mildly dilated. Right Atrium: Right atrial size was mildly dilated. Pericardium: There is no evidence of pericardial effusion. Mitral Valve: The mitral valve was not assessed. No evidence of mitral valve regurgitation. No evidence of mitral valve stenosis. Tricuspid Valve: The tricuspid valve is normal in structure. Tricuspid valve regurgitation is mild . No evidence of tricuspid stenosis. Aortic Valve: The aortic valve was not assessed. Aortic valve regurgitation is not visualized. No aortic stenosis is present. Pulmonic Valve: The pulmonic valve was normal in structure. Pulmonic valve regurgitation is not visualized. No evidence  of pulmonic stenosis. Aorta: The aortic root is normal in size and structure. Venous: The inferior vena cava is normal in size with greater than 50% respiratory variability, suggesting right atrial pressure of 3 mmHg. IAS/Shunts: No atrial level shunt detected by color flow Doppler.  RIGHT VENTRICLE RV Basal diam:  3.30 cm LEFT ATRIUM              Index       RIGHT ATRIUM           Index LA Vol (A2C):   125.0 ml 61.57 ml/m RA Area:     22.20 cm LA Vol (A4C):   77.1 ml  37.98 ml/m RA Volume:   75.00 ml  36.94 ml/m LA Biplane Vol: 99.5 ml  49.01 ml/m  TRICUSPID VALVE TR Peak grad:   30.7 mmHg TR Vmax:        277.00 cm/s Ena Dawley MD Electronically signed by Ena Dawley MD Signature Date/Time: 04/07/2020/12:14:58 PM    Final

## 2020-04-09 NOTE — Progress Notes (Signed)
   04/09/20 0246  Assess: MEWS Score  Temp 98.6 F (37 C)  BP 124/68  ECG Heart Rate (!) 118  Resp (!) 28  Level of Consciousness Alert  SpO2 96 %  O2 Device Nasal Cannula  Patient Activity (if Appropriate) In bed  O2 Flow Rate (L/min) 2 L/min  Assess: MEWS Score  MEWS Temp 0  MEWS Systolic 0  MEWS Pulse 2  MEWS RR 2  MEWS LOC 0  MEWS Score 4  MEWS Score Color Red  Assess: if the MEWS score is Yellow or Red  Were vital signs taken at a resting state? Yes  Focused Assessment No change from prior assessment  Early Detection of Sepsis Score *See Row Information* Medium  MEWS guidelines implemented *See Row Information* No, previously red, continue vital signs every 4 hours  Treat  MEWS Interventions Administered prn meds/treatments  Pain Scale 0-10  Pain Score 0  Notify: Charge Nurse/RN  Name of Charge Nurse/RN Notified Wenda Overland, RN  Date Charge Nurse/RN Notified 04/09/20  Time Charge Nurse/RN Notified 0246  Notify: Provider  Provider Name/Title Sidney Ace, MD  Date Provider Notified 04/09/20  Time Provider Notified 0230  Notification Type Page  Notification Reason Change in status  Provider response See new orders  Date of Provider Response 04/09/20  Time of Provider Response [id number] (MD called this RN.)  Document  Patient Outcome Stabilized after interventions   Patient called c/o SOB. Bedside monitor showed SpO2 76% on RA. This RN instructed Kaylee, NT to apply O2 via [place]. When this RN entered patient's room, she was sitting up at the EOB with labored breathing, anxious. O2 4L via [place] applied. Patient instructed on pursed lip breathing. Resp slowed to 20-26. HR 120s-140s A-Fib. SpO2 remained 87-90% on 4L. O2 turned up to 6L. Pulse ox sensor changed and placed on ear. Patient assisted to rest in bed with HOB elevated. EKG done. Patient denied CP. O2 able to be titrated down to 2L [place]. Mansey, MD ordered Lasix IV x1, and instructed to give Metoprolol IV as ordered PRN, even  though HR mostly sustaining 120s. Will continue to monitor.

## 2020-04-09 NOTE — Plan of Care (Signed)
  Problem: Education: Goal: Knowledge of General Education information will improve Description: Including pain rating scale, medication(s)/side effects and non-pharmacologic comfort measures Outcome: Progressing   Problem: Health Behavior/Discharge Planning: Goal: Ability to manage health-related needs will improve Outcome: Progressing   Problem: Clinical Measurements: Goal: Ability to maintain clinical measurements within normal limits will improve Outcome: Progressing Goal: Will remain free from infection Outcome: Progressing Goal: Diagnostic test results will improve Outcome: Progressing Goal: Respiratory complications will improve Outcome: Progressing Goal: Cardiovascular complication will be avoided Outcome: Progressing   Problem: Activity: Goal: Risk for activity intolerance will decrease Outcome: Progressing   Problem: Nutrition: Goal: Adequate nutrition will be maintained Outcome: Progressing   Problem: Coping: Goal: Level of anxiety will decrease Outcome: Progressing   Problem: Elimination: Goal: Will not experience complications related to bowel motility Outcome: Progressing Goal: Will not experience complications related to urinary retention Outcome: Progressing   Problem: Pain Managment: Goal: General experience of comfort will improve Outcome: Progressing   Problem: Safety: Goal: Ability to remain free from injury will improve Outcome: Progressing   Problem: Skin Integrity: Goal: Risk for impaired skin integrity will decrease Outcome: Progressing   Problem: Education: Goal: Knowledge of risk factors and measures for prevention of condition will improve Outcome: Progressing   Problem: Coping: Goal: Psychosocial and spiritual needs will be supported Outcome: Progressing   Problem: Respiratory: Goal: Will maintain a patent airway Outcome: Not Progressing Note: No progress being made towards goals; patient complained of shortness of breath  during night that caused her to suddenly wake. Goal: Complications related to the disease process, condition or treatment will be avoided or minimized Outcome: Progressing

## 2020-04-10 DIAGNOSIS — I4819 Other persistent atrial fibrillation: Secondary | ICD-10-CM

## 2020-04-10 LAB — GLUCOSE, CAPILLARY
Glucose-Capillary: 117 mg/dL — ABNORMAL HIGH (ref 70–99)
Glucose-Capillary: 172 mg/dL — ABNORMAL HIGH (ref 70–99)
Glucose-Capillary: 208 mg/dL — ABNORMAL HIGH (ref 70–99)
Glucose-Capillary: 190 mg/dL — ABNORMAL HIGH (ref 70–99)

## 2020-04-10 LAB — CULTURE, BLOOD (ROUTINE X 2)
Culture: NO GROWTH
Culture: NO GROWTH

## 2020-04-10 LAB — CBC
HCT: 41.2 % (ref 36.0–46.0)
Hemoglobin: 13.1 g/dL (ref 12.0–15.0)
MCH: 27.2 pg (ref 26.0–34.0)
MCHC: 31.8 g/dL (ref 30.0–36.0)
MCV: 85.5 fL (ref 80.0–100.0)
Platelets: 95 10*3/uL — ABNORMAL LOW (ref 150–400)
RBC: 4.82 MIL/uL (ref 3.87–5.11)
RDW: 15.3 % (ref 11.5–15.5)
WBC: 5.7 10*3/uL (ref 4.0–10.5)
nRBC: 0 % (ref 0.0–0.2)

## 2020-04-10 LAB — COMPREHENSIVE METABOLIC PANEL
ALT: 30 U/L (ref 0–44)
AST: 21 U/L (ref 15–41)
Albumin: 2.1 g/dL — ABNORMAL LOW (ref 3.5–5.0)
Alkaline Phosphatase: 45 U/L (ref 38–126)
Anion gap: 5 (ref 5–15)
BUN: 13 mg/dL (ref 8–23)
CO2: 28 mmol/L (ref 22–32)
Calcium: 8.2 mg/dL — ABNORMAL LOW (ref 8.9–10.3)
Chloride: 102 mmol/L (ref 98–111)
Creatinine, Ser: 0.6 mg/dL (ref 0.44–1.00)
GFR, Estimated: >60 mL/min (ref >60)
Glucose, Bld: 152 mg/dL — ABNORMAL HIGH (ref 70–99)
Potassium: 4.2 mmol/L (ref 3.5–5.1)
Sodium: 135 mmol/L (ref 135–145)
Total Bilirubin: 0.6 mg/dL (ref 0.3–1.2)
Total Protein: 4.5 g/dL — ABNORMAL LOW (ref 6.5–8.1)

## 2020-04-10 LAB — IGG, IGA, IGM
IgA: 37 mg/dL — ABNORMAL LOW (ref 64–422)
IgG (Immunoglobin G), Serum: 301 mg/dL — ABNORMAL LOW (ref 586–1602)
IgM (Immunoglobulin M), Srm: 8 mg/dL — ABNORMAL LOW (ref 26–217)

## 2020-04-10 LAB — PHOSPHORUS: Phosphorus: 3.4 mg/dL (ref 2.5–4.6)

## 2020-04-10 LAB — D-DIMER, QUANTITATIVE: D-Dimer, Quant: 0.87 ug/mL-FEU — ABNORMAL HIGH (ref 0.00–0.50)

## 2020-04-10 LAB — MAGNESIUM: Magnesium: 2.3 mg/dL (ref 1.7–2.4)

## 2020-04-10 LAB — C-REACTIVE PROTEIN: CRP: 6.2 mg/dL — ABNORMAL HIGH (ref <1.0)

## 2020-04-10 MED ORDER — LEVOFLOXACIN 750 MG PO TABS
750.0000 mg | ORAL_TABLET | Freq: Every day | ORAL | Status: AC
  Administered 2020-04-11: 750 mg via ORAL
  Filled 2020-04-10: qty 1

## 2020-04-10 MED ORDER — PREDNISONE 20 MG PO TABS
20.0000 mg | ORAL_TABLET | Freq: Every day | ORAL | Status: DC
  Administered 2020-04-10: 20 mg via ORAL
  Filled 2020-04-10: qty 1

## 2020-04-10 MED ORDER — METOPROLOL TARTRATE 100 MG PO TABS
100.0000 mg | ORAL_TABLET | Freq: Two times a day (BID) | ORAL | Status: DC
  Administered 2020-04-10 – 2020-04-12 (×4): 100 mg via ORAL
  Filled 2020-04-10 (×4): qty 1

## 2020-04-10 MED ORDER — POLYETHYLENE GLYCOL 3350 17 G PO PACK
17.0000 g | PACK | Freq: Every day | ORAL | Status: DC | PRN
  Administered 2020-04-10: 17 g via ORAL
  Filled 2020-04-10: qty 1

## 2020-04-10 NOTE — Plan of Care (Signed)
VSS. BP soft this AM. Voiding. No complaints overnight. Call bell within reach. Bed alarm on.   Problem: Education: Goal: Knowledge of risk factors and measures for prevention of condition will improve Outcome: Progressing   Problem: Coping: Goal: Psychosocial and spiritual needs will be supported Outcome: Progressing   Problem: Respiratory: Goal: Will maintain a patent airway Outcome: Progressing Goal: Complications related to the disease process, condition or treatment will be avoided or minimized Outcome: Progressing

## 2020-04-10 NOTE — Progress Notes (Signed)
PT Cancellation Note  Patient Details Name: Tasha [name] MRN: [id number] DOB: 04-Jun-1940   Cancelled Treatment:    Reason Eval/Treat Not Completed: Medical issues which prohibited therapy In a-fib with RVR pattern with HR as high as 142-169BPM at rest. RN notified via Epic secure chat. Will continue to follow.    Windell Norfolk, DPT, PN1   Supplemental Physical Therapist [facility]    Pager [phone] Acute Rehab Office [phone]

## 2020-04-10 NOTE — Progress Notes (Signed)
PROGRESS NOTE                                                                                                                                                                                                             Patient Demographics:    Tasha [name], is a 80 y.o. female, DOB - 05-22-1940, AGT:[id number]  Outpatient Primary MD for the patient is Lavone Orn, MD   Admit date - 04/04/2020   LOS - 5  Chief Complaint  Patient presents with  . Nausea  . Diarrhea       Brief Narrative: Patient is a 80 y.o. female with PMHx of multiple sclerosis, CLL, PAF-who presented with weakness and diarrhea-she was found to have COVID-19 infection.    Her husband had COVID-19 infection approximately 3 weeks ago-patient started having symptoms then-which mostly involved mild URI and diarrhea.  COVID-19 vaccinated status: Vaccinated  Significant Events: 3/18>> Admit to [facility] for Covid 19 infection/diarrhea/weakness  Significant studies: 3/18>>Chest x-ray: Bilateral pulmonary infiltrates.  COVID-19 medications: Steroids:3/20  Antibiotics: Levaquin:3/20>>  Microbiology data: 3/18 >>blood culture: No growth  Procedures: None  Consults: None  DVT prophylaxis:  apixaban (ELIQUIS) tablet 5 mg     Subjective:   No major issues overnight-heart rate overall better but still in the 120s at rest this morning.   Assessment  & Plan :   Gastroenteritis: Has resolved-no diarrhea since admission-stool GI pathogen panel/C. difficile PCR never sent.  Doubt any utility of any stool studies at this point.  Pneumonia: Unclear whether this is COVID-19 related pneumonitis/inflammation or superimposed bacterial pneumonia.  Procalcitonin mildly elevated-but has down trended-remains on Levaquin-steroids being tapered down.  Cultures negative so far.   Sepsis: Due to PNA/gastroenteritis-cultures negative so far-sepsis physiology has  significantly improved  PAF with RVR: Remains in A. fib-heart rate overall better but still in the 120s this morning.  Cardiology following-patient on metoprolol and digoxin.  Remains on Eliquis.  RVR likely provoked by hypothyroidism and pneumonia.    Hyperthyroidism: Appears to be a new diagnosis-likely may have caused her RVR/diarrhea.  Stable on methimazole-spoke with her oncologist-Dr. Magrinat on 3/21-patient has an appointment in April 2022 for repeat CBC to monitor for agranulocytosis given history of underlying CLL.Marland Kitchen  Have discussed with patient's daughter over the past few days regarding outpatient follow-up with endocrinology for further definitive treatment.  Leukopenia/thrombocytopenia: Probably due to COVID-19 infection-however has CLL-has improved.  See above regarding discussion with primary oncologist.  Hyponatremia: Continues to improve-likely due to diarrhea.  Follow electrolytes periodically.    Mildly prolonged QTC: Improved-K/Mg stable.    Orthostatic hypotension: Seems to have resolved-no symptoms when walking.  History of multiple sclerosis-at baseline ambulatory with a walker.  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  Obesity: Estimated body mass index is 32.81 kg/m as calculated from the following:   Height as of this encounter: 5\' 6"  (1.676 m).   Weight as of this encounter: 92.2 kg.    ABG: No results found for: PHART, PCO2ART, PO2ART, HCO3, TCO2, ACIDBASEDEF, O2SAT  Vent Settings: N/A  Condition - Stable  Family Communication  :Daughter-Tracy-[phone] updated on 3/22  Code Status :  Full Code  Diet :  Diet Order            Diet Heart Room service appropriate? Yes; Fluid consistency: Thin  Diet effective now                  Disposition Plan  :   Status is: Inpatient  Remains inpatient appropriate because:Inpatient level of care appropriate due to severity of illness   Dispo: The patient is from:  Home              Anticipated d/c is to: Home              Patient currently is not medically stable to d/c.   Difficult to place patient No    Barriers to discharge: Pneumonia-on IV steroids/IV antibiotics-now with A. fib with RVR.  Antimicorbials  :    Anti-infectives (From admission, onward)   Start     Dose/Rate Route Frequency Ordered Stop   04/11/20 0800  levofloxacin (LEVAQUIN) tablet 750 mg        750 mg Oral Daily 04/10/20 0957 04/12/20 0959   04/07/20 0800  levofloxacin (LEVAQUIN) IVPB 750 mg  Status:  Discontinued        750 mg 100 mL/hr over 90 Minutes Intravenous Every 24 hours 04/07/20 0650 04/10/20 0957      Inpatient Medications  Scheduled Meds: . apixaban  5 mg Oral BID  . vitamin C  500 mg Oral Daily  . digoxin  0.125 mg Oral Daily  . famotidine  20 mg Oral BID  . feeding supplement  237 mL Oral BID BM  . insulin aspart  0-9 Units Subcutaneous TID WC  . [START ON 04/11/2020] levofloxacin  750 mg Oral Daily  . methimazole  5 mg Oral BID  . metoprolol tartrate  50 mg Oral TID  . predniSONE  20 mg Oral Q breakfast  . zinc sulfate  220 mg Oral Daily   Continuous Infusions:  PRN Meds:.acetaminophen, albuterol, loperamide, metoprolol tartrate, polyethylene glycol, trimethobenzamide   Time Spent in minutes  25  See all Orders from today for further details   Oren Binet M.D on 04/10/2020 at 12:09 PM  To page go to [email or web address] - use universal password  [facility] -  Office  [phone]    Objective:   Vitals:   04/10/20 0616 04/10/20 0811 04/10/20 0836 04/10/20 1104  BP: 111/60  (!) 119/56   Pulse: 98 (!) 113 94   Resp:   20   Temp:   98.5 F (36.9 C) (!) 97.3 F (36.3 C)  TempSrc:    Oral  SpO2:   100%   Weight:  Height:        Wt Readings from Last 3 Encounters:  04/10/20 92.2 kg  11/27/19 95.1 kg  10/11/19 95.6 kg     Intake/Output Summary (Last 24 hours) at 04/10/2020 1209 Last data filed at 04/10/2020  5397 Gross per 24 hour  Intake 150 ml  Output 1500 ml  Net -1350 ml     Physical Exam Gen Exam:Alert awake-not in any distress HEENT:atraumatic, normocephalic Chest: B/L clear to auscultation anteriorly CVS:S1S2 irregular Abdomen:soft non tender, non distended Extremities:no edema Neurology: Non focal Skin: no rash   Data Review:    CBC Recent Labs  Lab 04/06/20 0241 04/07/20 0228 04/08/20 0412 04/09/20 0341 04/10/20 0223  WBC 2.7* 1.7* 4.9 6.8 5.7  HGB 12.2 12.7 12.7 14.1 13.1  HCT 35.3* 37.1 37.7 42.2 41.2  PLT 77* 78* 97* 126* 95*  MCV 81.5 81.0 82.3 82.6 85.5  MCH 28.2 27.7 27.7 27.6 27.2  MCHC 34.6 34.2 33.7 33.4 31.8  RDW 14.6 14.9 15.1 15.3 15.3  LYMPHSABS 0.7  --  1.4  --   --   MONOABS 0.3  --  0.3  --   --   EOSABS 0.0  --  0.0  --   --   BASOSABS 0.0  --  0.0  --   --     Chemistries  Recent Labs  Lab 04/06/20 0241 04/07/20 0228 04/08/20 0412 04/09/20 0341 04/10/20 0223  NA 134* 133* 133* 135 135  K 3.9 4.0 4.7 4.3 4.2  CL 102 102 102 101 102  CO2 25 22 23 27 28   GLUCOSE 135* 225* 238* 134* 152*  BUN 6* 11 13 14 13   CREATININE 0.66 0.58 0.60 0.79 0.60  CALCIUM 8.2* 8.5* 8.6* 8.7* 8.2*  MG 1.9 2.4 2.3 2.1 2.3  AST 23 18 21  33 21  ALT 27 26 26  35 30  ALKPHOS 41 40 41 45 45  BILITOT 0.7 0.6 0.6 0.6 0.6   ------------------------------------------------------------------------------------------------------------------ No results for input(s): CHOL, HDL, LDLCALC, TRIG, CHOLHDL, LDLDIRECT in the last 72 hours.  Lab Results  Component Value Date   HGBA1C 6.1 (H) 04/08/2020   ------------------------------------------------------------------------------------------------------------------ No results for input(s): TSH, T4TOTAL, T3FREE, THYROIDAB in the last 72 hours.  Invalid input(s): FREET3 ------------------------------------------------------------------------------------------------------------------ No results for input(s):  VITAMINB12, FOLATE, FERRITIN, TIBC, IRON, RETICCTPCT in the last 72 hours.  Coagulation profile No results for input(s): INR, PROTIME in the last 168 hours.  Recent Labs    04/09/20 0341 04/10/20 0223  DDIMER 1.01* 0.87*    Cardiac Enzymes No results for input(s): CKMB, TROPONINI, MYOGLOBIN in the last 168 hours.  Invalid input(s): CK ------------------------------------------------------------------------------------------------------------------    Component Value Date/Time   BNP 282.2 (H) 04/05/2020 6734    Micro Results Recent Results (from the past 240 hour(s))  Culture, blood (Routine X 2) w Reflex to ID Panel     Status: None   Collection Time: 04/05/20  9:50 AM   Specimen: BLOOD  Result Value Ref Range Status   Specimen Description BLOOD RIGHT ANTECUBITAL  Final   Special Requests   Final    BOTTLES DRAWN AEROBIC ONLY Blood Culture results may not be optimal due to an inadequate volume of blood received in culture bottles   Culture   Final    NO GROWTH 5 DAYS Performed at [facility], [place] [address] [place], [place] [address]    Report Status 04/10/2020 FINAL  Final  Culture, blood (Routine X 2) w Reflex to ID  Panel     Status: None   Collection Time: 04/05/20  9:59 AM   Specimen: BLOOD RIGHT HAND  Result Value Ref Range Status   Specimen Description BLOOD RIGHT HAND  Final   Special Requests   Final    BOTTLES DRAWN AEROBIC ONLY Blood Culture results may not be optimal due to an inadequate volume of blood received in culture bottles   Culture   Final    NO GROWTH 5 DAYS Performed at [facility], [place] [address], [place], [place] [address]    Report Status 04/10/2020 FINAL  Final    Radiology Reports DG Chest Port 1 View  Result Date: 04/07/2020 CLINICAL DATA:  Shortness of breath. EXAM: PORTABLE CHEST 1 VIEW COMPARISON:  Prior chest radiograph 04/05/2020 and earlier. FINDINGS: Mild cardiomegaly, unchanged. Aortic atherosclerosis. Patchy  bilateral airspace disease, greater on the right, increased from the prior examination. Persistent small left pleural effusion. No evidence of pneumothorax IMPRESSION: Patchy bilateral airspace disease, greater on the right, increased from the prior examination. Persistent small left pleural effusion. Unchanged cardiomegaly. Electronically Signed   By: Kellie Simmering DO   On: 04/07/2020 08:01   DG Chest Port 1 View  Result Date: 04/05/2020 CLINICAL DATA:  Sob,COVID positive EXAM: PORTABLE CHEST 1 VIEW COMPARISON:  04/05/2019 FINDINGS: Stable cardiac silhouette. Normal increase in mild airspace disease. Mild increased central venous congestion. Small RIGHT effusion. IMPRESSION: Subtle increase in pulmonary airspace disease. Findings concerning for early pneumonia versus pulmonary edema. Electronically Signed   By: Suzy Bouchard M.D.   On: 04/05/2020 07:38   ECHOCARDIOGRAM COMPLETE  Result Date: 04/07/2020    ECHOCARDIOGRAM REPORT   Patient Name:   ELEONORE SHIPPEE Date of Exam: 04/06/2020 Medical Rec #:  [id number]     Height:       66.0 in Accession #:    [id number]    Weight:       207.2 lb Date of Birth:  1940-05-29      BSA:          2.030 m Patient Age:    73 years      BP:           116/79 mmHg Patient Gender: F             HR:           109 bpm. Exam Location:  Inpatient Procedure: 2D Echo, Cardiac Doppler and Color Doppler Indications:    Acute Respiratory Distress  History:        Patient has prior history of Echocardiogram examinations, most                 recent 11/01/2017. Covid 19 positive.  Sonographer:    Merrie Roof RDCS Referring Phys: [place]  1. Left ventricular ejection fraction, by estimation, is 65 to 70%. The left ventricle has normal function. The left ventricle has no regional wall motion abnormalities. Left ventricular diastolic function could not be evaluated.  2. Right ventricular systolic function is mildly reduced. The right ventricular size is mildly  enlarged. There is mildly elevated pulmonary artery systolic pressure. The estimated right ventricular systolic pressure is 25.9 mmHg.  3. Left atrial size was mildly dilated.  4. Right atrial size was mildly dilated.  5. The mitral valve was not assessed. No evidence of mitral valve regurgitation. No evidence of mitral stenosis.  6. The aortic valve was not assessed. Aortic valve regurgitation is not visualized. No aortic  stenosis is present.  7. The inferior vena cava is normal in size with greater than 50% respiratory variability, suggesting right atrial pressure of 3 mmHg. FINDINGS  Left Ventricle: Left ventricular ejection fraction, by estimation, is 65 to 70%. The left ventricle has normal function. The left ventricle has no regional wall motion abnormalities. The left ventricular internal cavity size was normal in size. There is  no left ventricular hypertrophy. Left ventricular diastolic function could not be evaluated. Right Ventricle: The right ventricular size is mildly enlarged. No increase in right ventricular wall thickness. Right ventricular systolic function is mildly reduced. There is mildly elevated pulmonary artery systolic pressure. The tricuspid regurgitant  velocity is 2.77 m/s, and with an assumed right atrial pressure of 3 mmHg, the estimated right ventricular systolic pressure is 65.4 mmHg. Left Atrium: Left atrial size was mildly dilated. Right Atrium: Right atrial size was mildly dilated. Pericardium: There is no evidence of pericardial effusion. Mitral Valve: The mitral valve was not assessed. No evidence of mitral valve regurgitation. No evidence of mitral valve stenosis. Tricuspid Valve: The tricuspid valve is normal in structure. Tricuspid valve regurgitation is mild . No evidence of tricuspid stenosis. Aortic Valve: The aortic valve was not assessed. Aortic valve regurgitation is not visualized. No aortic stenosis is present. Pulmonic Valve: The pulmonic valve was normal in structure.  Pulmonic valve regurgitation is not visualized. No evidence of pulmonic stenosis. Aorta: The aortic root is normal in size and structure. Venous: The inferior vena cava is normal in size with greater than 50% respiratory variability, suggesting right atrial pressure of 3 mmHg. IAS/Shunts: No atrial level shunt detected by color flow Doppler.  RIGHT VENTRICLE RV Basal diam:  3.30 cm LEFT ATRIUM              Index       RIGHT ATRIUM           Index LA Vol (A2C):   125.0 ml 61.57 ml/m RA Area:     22.20 cm LA Vol (A4C):   77.1 ml  37.98 ml/m RA Volume:   75.00 ml  36.94 ml/m LA Biplane Vol: 99.5 ml  49.01 ml/m  TRICUSPID VALVE TR Peak grad:   30.7 mmHg TR Vmax:        277.00 cm/s Ena Dawley MD Electronically signed by Ena Dawley MD Signature Date/Time: 04/07/2020/12:14:58 PM    Final

## 2020-04-10 NOTE — Progress Notes (Signed)
PHARMACIST - PHYSICIAN COMMUNICATION DR:   Sloan Leiter CONCERNING: Antibiotic IV to Oral Route Change Policy  RECOMMENDATION: This patient is receiving Levaquin by the intravenous route.  Based on criteria approved by the Pharmacy and Therapeutics Committee, the antibiotic(s) is/are being converted to the equivalent oral dose form(s).   DESCRIPTION: These criteria include:  Patient being treated for a respiratory tract infection, urinary tract infection, cellulitis or clostridium difficile associated diarrhea if on metronidazole  The patient is not neutropenic and does not exhibit a GI malabsorption state  The patient is eating (either orally or via tube) and/or has been taking other orally administered medications for a least 24 hours  The patient is improving clinically and has a Tmax < 100.5  If you have questions about this conversion, please contact the Pharmacy Department  []   [phone] )  [facility] []   [phone] )  [facility] [x]   [phone] )  [facility] []   [phone] )  [facility] []   [phone] )  [facility]

## 2020-04-10 NOTE — Progress Notes (Signed)
Progress Note  Patient Name: Tasha [name] Date of Encounter: 04/10/2020  [facility] Cardiologist: Sanda Klein, MD   Subjective   Feels tired.   Inpatient Medications    Scheduled Meds: . apixaban  5 mg Oral BID  . vitamin C  500 mg Oral Daily  . digoxin  0.125 mg Oral Daily  . famotidine  20 mg Oral BID  . feeding supplement  237 mL Oral BID BM  . insulin aspart  0-9 Units Subcutaneous TID WC  . [START ON 04/11/2020] levofloxacin  750 mg Oral Daily  . methimazole  5 mg Oral BID  . metoprolol tartrate  50 mg Oral TID  . predniSONE  20 mg Oral Q breakfast  . zinc sulfate  220 mg Oral Daily   Continuous Infusions:  PRN Meds: acetaminophen, albuterol, loperamide, metoprolol tartrate, polyethylene glycol, trimethobenzamide   Vital Signs    Vitals:   04/10/20 0616 04/10/20 0811 04/10/20 0836 04/10/20 1104  BP: 111/60  (!) 119/56   Pulse: 98 (!) 113 94   Resp:   20   Temp:   98.5 F (36.9 C) (!) 97.3 F (36.3 C)  TempSrc:    Oral  SpO2:   100%   Weight:      Height:        Intake/Output Summary (Last 24 hours) at 04/10/2020 1218 Last data filed at 04/10/2020 2423 Gross per 24 hour  Intake 150 ml  Output 1500 ml  Net -1350 ml   Last 3 Weights 04/10/2020 04/09/2020 04/08/2020  Weight (lbs) 203 lb 4.2 oz 203 lb 4.2 oz 208 lb 1.8 oz  Weight (kg) 92.2 kg 92.2 kg 94.4 kg      Telemetry    AFIB now 130-140 - Personally Reviewed  ECG    AFIB - Personally Reviewed  Physical Exam   GEN: No acute distress.   Neck: No JVD Cardiac: Irreg irreg, no murmurs, rubs, or gallops.  Respiratory: Clear to auscultation bilaterally. GI: Soft, nontender, non-distended  MS: No edema; No deformity. Neuro:  Nonfocal  Psych: Normal affect   Labs    High Sensitivity Troponin:  No results for input(s): TROPONINIHS in the last 720 hours.    Chemistry Recent Labs  Lab 04/08/20 0412 04/09/20 0341 04/10/20 0223  NA 133* 135 135  K 4.7 4.3 4.2  CL 102 101 102  CO2  23 27 28   GLUCOSE 238* 134* 152*  BUN 13 14 13   CREATININE 0.60 0.79 0.60  CALCIUM 8.6* 8.7* 8.2*  PROT 4.7* 5.5* 4.5*  ALBUMIN 2.2* 2.5* 2.1*  AST 21 33 21  ALT 26 35 30  ALKPHOS 41 45 45  BILITOT 0.6 0.6 0.6  GFRNONAA >60 >60 >60  ANIONGAP 8 7 5      Hematology Recent Labs  Lab 04/08/20 0412 04/09/20 0341 04/10/20 0223  WBC 4.9 6.8 5.7  RBC 4.58 5.11 4.82  HGB 12.7 14.1 13.1  HCT 37.7 42.2 41.2  MCV 82.3 82.6 85.5  MCH 27.7 27.6 27.2  MCHC 33.7 33.4 31.8  RDW 15.1 15.3 15.3  PLT 97* 126* 95*    BNP Recent Labs  Lab 04/05/20 0939  BNP 282.2*     DDimer  Recent Labs  Lab 04/08/20 0412 04/09/20 0341 04/10/20 0223  DDIMER 0.73* 1.01* 0.87*       Patient Profile     80 y.o. female COVID, AFIB, CLL  Assessment & Plan    AFIB with RVR  - still fast  -  will now try to increase metoprolol to 100mg  BID  - Continue with digoxin as well  - Hopefully will improve when COVID and HYPERTHYROIDISM improves as well    For questions or updates, please contact [facility] Please consult [email or web address] for contact info under        Signed, Candee Furbish, MD  04/10/2020, 12:18 PM

## 2020-04-11 LAB — D-DIMER, QUANTITATIVE: D-Dimer, Quant: 0.77 ug{FEU}/mL — ABNORMAL HIGH (ref 0.00–0.50)

## 2020-04-11 LAB — CBC
HCT: 40.1 % (ref 36.0–46.0)
Hemoglobin: 13.1 g/dL (ref 12.0–15.0)
MCH: 27.6 pg (ref 26.0–34.0)
MCHC: 32.7 g/dL (ref 30.0–36.0)
MCV: 84.6 fL (ref 80.0–100.0)
Platelets: 111 K/uL — ABNORMAL LOW (ref 150–400)
RBC: 4.74 MIL/uL (ref 3.87–5.11)
RDW: 15.3 % (ref 11.5–15.5)
WBC: 6.9 K/uL (ref 4.0–10.5)
nRBC: 0 % (ref 0.0–0.2)

## 2020-04-11 LAB — GLUCOSE, CAPILLARY
Glucose-Capillary: 94 mg/dL (ref 70–99)
Glucose-Capillary: 136 mg/dL — ABNORMAL HIGH (ref 70–99)
Glucose-Capillary: 159 mg/dL — ABNORMAL HIGH (ref 70–99)
Glucose-Capillary: 132 mg/dL — ABNORMAL HIGH (ref 70–99)

## 2020-04-11 LAB — C-REACTIVE PROTEIN: CRP: 5.2 mg/dL — ABNORMAL HIGH (ref <1.0)

## 2020-04-11 MED ORDER — PREDNISONE 5 MG PO TABS
10.0000 mg | ORAL_TABLET | Freq: Every day | ORAL | Status: AC
  Administered 2020-04-11: 10 mg via ORAL
  Filled 2020-04-11: qty 2

## 2020-04-11 NOTE — Progress Notes (Signed)
Physical Therapy Treatment Patient Details Name: Tasha [name] MRN: [id number] DOB: [birth year] Today's Date: 04/11/2020    History of Present Illness Pt is a 80 y.o. female admitted 04/04/20 with c/o diarrhea and weakness. CXR concerning for early PNA or edema. Workup for gastroenteritis secondary to COVID-19, sepsis. PMH includes afib, Charcot-Marie disease, peripheral neuropathy, Bell's palsy, Bouchard nodes, R foot drop.    PT Comments    Pt reporting feeling weak, fatigued, and dizzy this am, but agreeable to work with therapy. Upon sitting EOB and standing pt with report of nausea. BP soft throughout session, but not positive for orthostatic hypotension. When attempting pivot to recliner chair, pt became confused and began walking forward, requiring multiple cues to correct and progress backwards to chair. Will continue to follow acutely for mobility progression.     Follow Up Recommendations  Home health PT;Supervision for mobility/OOB     Equipment Recommendations  None recommended by PT    Recommendations for Other Services       Precautions / Restrictions Precautions Precautions: Fall;Other (comment) Precaution Comments: R foot drop, likes her shoes donned, watch BP Restrictions Weight Bearing Restrictions: No Other Position/Activity Restrictions: Pt has AFO at home but reports she does not typically wear it.    Mobility  Bed Mobility Overal bed mobility: Needs Assistance Bed Mobility: Supine to Sit     Supine to sit: Supervision;HOB elevated     General bed mobility comments: +rail, increased time    Transfers Overall transfer level: Needs assistance Equipment used: Rolling walker (2 wheeled) Transfers: Sit to/from [facility] Sit to Stand: Mod assist;+2 physical assistance;+2 safety/equipment;From elevated surface Stand pivot transfers: Min assist;+2 safety/equipment       General transfer comment: Mod A x 2 for power up from elevated bed,  pt did attempt pushing up from bed with one UE. Min A  +2 for safety in pivoting to recliner with cues needed for sequencing. pt attempting to walk forward when cued to back up to chair with RW  Ambulation/Gait Ambulation/Gait assistance: Min assist;+2 safety/equipment;+2 physical assistance Gait Distance (Feet): 6 Feet Assistive device: Rolling walker (2 wheeled) Gait Pattern/deviations: Decreased stride length;Step-to pattern;Decreased stance time - right;Steppage Gait velocity: Decreased Gait velocity interpretation: <1.31 ft/sec, indicative of household ambulator General Gait Details: 6 ft during transfer to chair. Pt became confused and began walking forward instead of backing up into chair. cues to correct. slow, labored gait requiring increased time   Stairs             Wheelchair Mobility    Modified Rankin (Stroke Patients Only)       Balance Overall balance assessment: Needs assistance Sitting-balance support: No upper extremity supported;Feet supported Sitting balance-Leahy Scale: Fair Sitting balance - Comments: initially poor with posterior lean and UE support, progressed to fair   Standing balance support: Bilateral upper extremity supported;During functional activity Standing balance-Leahy Scale: Poor Standing balance comment: reliant on B UE support of walker                            Cognition Arousal/Alertness: Awake/alert Behavior During Therapy: Flat affect Overall Cognitive Status: Impaired/Different from baseline Area of Impairment: Problem solving;Awareness                           Awareness: Emergent Problem Solving: Slow processing;Decreased initiation General Comments: pt not feeling well today, still with slower processing and cues  for problem solving. some decreased awareness of medical complexities reporting she would do better at home      Exercises      General Comments General comments (skin integrity,  edema, etc.): HR WFL during session. Assessed orthostatic BPs as RN/NT reported pt with difficulty standing for this assessment earlier. 94/61 lying in bed, 106/53 sitting EOB, 123/108 immediately after transfer, 92/75 after sitting in chair for 1 min.      Pertinent Vitals/Pain Pain Assessment: No/denies pain    Home Living                      Prior Function            PT Goals (current goals can now be found in the care plan section) Acute Rehab PT Goals Patient Stated Goal: return home with husband's assist PT Goal Formulation: With patient Time For Goal Achievement: 04/20/20 Potential to Achieve Goals: Good Progress towards PT goals: Progressing toward goals    Frequency    Min 3X/week      PT Plan Current plan remains appropriate    Co-evaluation PT/OT/SLP Co-Evaluation/Treatment: Yes Reason for Co-Treatment: Necessary to address cognition/behavior during functional activity;For patient/therapist safety;To address functional/ADL transfers PT goals addressed during session: Mobility/safety with mobility OT goals addressed during session: ADL's and self-care      AM-PAC PT "6 Clicks" Mobility   Outcome Measure  Help needed turning from your back to your side while in a flat bed without using bedrails?: A Little Help needed moving from lying on your back to sitting on the side of a flat bed without using bedrails?: A Little Help needed moving to and from a bed to a chair (including a wheelchair)?: A Lot Help needed standing up from a chair using your arms (e.g., wheelchair or bedside chair)?: A Lot Help needed to walk in hospital room?: A Little Help needed climbing 3-5 steps with a railing? : A Lot 6 Click Score: 15    End of Session Equipment Utilized During Treatment: Gait belt Activity Tolerance: Patient limited by lethargy;Other (comment) (nausea, dizziness, weak) Patient left: in chair;with call bell/phone within reach Nurse Communication:  Mobility status PT Visit Diagnosis: Other abnormalities of gait and mobility (R26.89);Muscle weakness (generalized) (M62.81)     Time: 3614-4315 PT Time Calculation (min) (ACUTE ONLY): 32 min  Charges:  $Therapeutic Activity: 8-22 mins                     Benjiman Core, [place] Pager [phone] Acute Rehab   Allena Katz 04/11/2020, 1:08 PM

## 2020-04-11 NOTE — Progress Notes (Signed)
Occupational Therapy Treatment Patient Details Name: Tasha [name] MRN: [id number] DOB: [birth year] Today's Date: 04/11/2020    History of present illness Pt is a 80 y.o. female admitted 04/04/20 with c/o diarrhea and weakness. CXR concerning for early PNA or edema. Workup for gastroenteritis secondary to COVID-19, sepsis. PMH includes afib, Charcot-Marie disease, peripheral neuropathy, Bell's palsy, Bouchard nodes, R foot drop.   OT comments  Pt seen for OT session today though unable to progress ADLs/mobility as planned today due to pt nausea and lightheadness when OOB. Assessed orthostatic BP readings, which were soft but negative. HR WFL throughout. Pt continues to require Mod A x 2 for power up from elevated bed via RW, Min A x 2 for taking steps to recliner requiring cues for accurate sequencing. Pt able to complete simple grooming tasks while seated in recliner. Hope to be able to progress mobility to sink and improve standing tolerance during next session.    Follow Up Recommendations  Home health OT;Supervision/Assistance - 24 hour    Equipment Recommendations  None recommended by OT    Recommendations for Other Services      Precautions / Restrictions Precautions Precautions: Fall;Other (comment) Precaution Comments: R foot drop, likes her shoes donned, watch BP Restrictions Weight Bearing Restrictions: No       Mobility Bed Mobility Overal bed mobility: Needs Assistance Bed Mobility: Supine to Sit     Supine to sit: Supervision;HOB elevated     General bed mobility comments: +rail, increased time    Transfers Overall transfer level: Needs assistance Equipment used: Rolling walker (2 wheeled) Transfers: Sit to/from [facility] Sit to Stand: Mod assist;+2 physical assistance;+2 safety/equipment;From elevated surface Stand pivot transfers: Min assist;+2 safety/equipment       General transfer comment: Mod A x 2 for power up from elevated bed, pt  did attempt pushing up from bed with one UE. Min A  +2 for safety in pivoting to recliner with cues needed for sequencing. pt attempting to walk forward when cued to back up to chair with RW    Balance Overall balance assessment: Needs assistance Sitting-balance support: No upper extremity supported;Feet supported Sitting balance-Leahy Scale: Fair     Standing balance support: Bilateral upper extremity supported;During functional activity Standing balance-Leahy Scale: Poor Standing balance comment: reliant on B UE support of walker                           ADL either performed or assessed with clinical judgement   ADL Overall ADL's : Needs assistance/impaired     Grooming: Set up;Oral care Grooming Details (indicate cue type and reason): setup to brush teeth seated in recliner. hoped to be able to ambulate to sink for standing tolerance in ADLs but due to dizziness, nausea, unable to make it to sink today             Lower Body Dressing: Sit to/from stand;Total assistance Lower Body Dressing Details (indicate cue type and reason): decreased ability to assist today due to pt reports not feeling well. Total A to don socks/shoes sitting EOB, reports having no energy to assist in pushing foot in heel               General ADL Comments: Limited by dizziness and nausea today, unable to progress ADLs/mobility as planned     Vision   Vision Assessment?: No apparent visual deficits   Perception     Praxis  Cognition Arousal/Alertness: Awake/alert Behavior During Therapy: Flat affect Overall Cognitive Status: Impaired/Different from baseline Area of Impairment: Problem solving;Awareness                           Awareness: Emergent Problem Solving: Slow processing;Decreased initiation General Comments: pt not feeling well today, still with slower processing and cues for problem solving. some decreased awareness of medical complexities reporting  she would do better at home        Exercises     Shoulder Instructions       General Comments HR WFL during session. Assessed orthostatic BPs as RN/NT reported pt with difficulty standing for this assessment earlier. 94/61 lying in bed, 106/53 sitting EOB, 123/108 immediately after transfer, 92/75 after sitting in chair for 1 min.    Pertinent Vitals/ Pain       Pain Assessment: No/denies pain  Home Living                                          Prior Functioning/Environment              Frequency  Min 2X/week        Progress Toward Goals  OT Goals(current goals can now be found in the care plan section)  Progress towards OT goals: OT to reassess next treatment  Acute Rehab OT Goals Patient Stated Goal: return home with husband's assist OT Goal Formulation: With patient Time For Goal Achievement: 04/20/20 Potential to Achieve Goals: Good ADL Goals Pt Will Perform Grooming: with min guard assist;standing Pt Will Perform Lower Body Bathing: with min assist;sit to/from stand Pt Will Perform Lower Body Dressing: with min assist;sit to/from stand Pt Will Transfer to Toilet: with min guard assist;ambulating;bedside commode Pt Will Perform Toileting - Clothing Manipulation and hygiene: with min guard assist;sit to/from stand Additional ADL Goal #1: Pt will employ energy conservation strategies in ADL with minimal verbal cues. Additional ADL Goal #2: Pt will perform bed mobility modified independently in preparation for ADL.  Plan Discharge plan remains appropriate    Co-evaluation    PT/OT/SLP Co-Evaluation/Treatment: Yes Reason for Co-Treatment: Necessary to address cognition/behavior during functional activity;For patient/therapist safety;To address functional/ADL transfers   OT goals addressed during session: ADL's and self-care      AM-PAC OT "6 Clicks" Daily Activity     Outcome Measure   Help from another person eating meals?:  None Help from another person taking care of personal grooming?: A Little Help from another person toileting, which includes using toliet, bedpan, or urinal?: A Little Help from another person bathing (including washing, rinsing, drying)?: A Lot Help from another person to put on and taking off regular upper body clothing?: A Little Help from another person to put on and taking off regular lower body clothing?: Total 6 Click Score: 16    End of Session Equipment Utilized During Treatment: Gait belt;Rolling walker  OT Visit Diagnosis: Unsteadiness on feet (R26.81);Other abnormalities of gait and mobility (R26.89);Muscle weakness (generalized) (M62.81);Other symptoms and signs involving cognitive function   Activity Tolerance Patient limited by fatigue   Patient Left in chair;with call bell/phone within reach   Nurse Communication Mobility status;Other (comment) (orthostatics)        Time: 3419-6222 OT Time Calculation (min): 32 min  Charges: OT General Charges $OT Visit: 1 Visit OT Treatments $Self Care/Home Management :  8-22 mins  Malachy Chamber, OTR/L Acute Rehab Services Office: [phone]   Layla Maw 04/11/2020, 12:15 PM

## 2020-04-11 NOTE — Discharge Instructions (Signed)

## 2020-04-11 NOTE — Progress Notes (Signed)
Progress Note  Patient Name: Tasha [name] Date of Encounter: 04/11/2020  [facility] Cardiologist: Sanda Klein, MD   Subjective   Felt some dizziness this morning.  Getting orthostatics  Inpatient Medications    Scheduled Meds:  apixaban  5 mg Oral BID   vitamin C  500 mg Oral Daily   digoxin  0.125 mg Oral Daily   famotidine  20 mg Oral BID   feeding supplement  237 mL Oral BID BM   insulin aspart  0-9 Units Subcutaneous TID WC   methimazole  5 mg Oral BID   metoprolol tartrate  100 mg Oral BID   zinc sulfate  220 mg Oral Daily   Continuous Infusions:  PRN Meds: acetaminophen, albuterol, loperamide, metoprolol tartrate, polyethylene glycol, trimethobenzamide   Vital Signs    Vitals:   04/10/20 1338 04/10/20 1553 04/10/20 2008 04/11/20 0507  BP: 115/90  (!) 124/96 107/68  Pulse:  88 100 84  Resp: 20 17  20   Temp: 97.6 F (36.4 C) 98.2 F (36.8 C) 98.5 F (36.9 C) 97.7 F (36.5 C)  TempSrc: Oral Oral Oral Axillary  SpO2: 100% 100% 100% 98%  Weight:    91.8 kg  Height:        Intake/Output Summary (Last 24 hours) at 04/11/2020 1043 Last data filed at 04/11/2020 0522 Gross per 24 hour  Intake 120 ml  Output 800 ml  Net -680 ml   Last 3 Weights 04/11/2020 04/10/2020 04/09/2020  Weight (lbs) 202 lb 6.1 oz 203 lb 4.2 oz 203 lb 4.2 oz  Weight (kg) 91.8 kg 92.2 kg 92.2 kg      Telemetry    Atrial fibrillation heart rate in the 90s currently improved- Personally Reviewed  ECG    Atrial fibrillation heart rate 119 on 04/11/2020- Personally Reviewed  Physical Exam   GEN: No acute distress.   Neck: No JVD Cardiac:  Irregularly irregular, no murmurs, rubs, or gallops.  Respiratory: Clear to auscultation bilaterally. GI: Soft, nontender, non-distended  MS: No edema; No deformity. Neuro:  Nonfocal  Psych: Normal affect   Labs    High Sensitivity Troponin:  No results for input(s): TROPONINIHS in the last 720 hours.    Chemistry Recent  Labs  Lab 04/08/20 0412 04/09/20 0341 04/10/20 0223  NA 133* 135 135  K 4.7 4.3 4.2  CL 102 101 102  CO2 23 27 28   GLUCOSE 238* 134* 152*  BUN 13 14 13   CREATININE 0.60 0.79 0.60  CALCIUM 8.6* 8.7* 8.2*  PROT 4.7* 5.5* 4.5*  ALBUMIN 2.2* 2.5* 2.1*  AST 21 33 21  ALT 26 35 30  ALKPHOS 41 45 45  BILITOT 0.6 0.6 0.6  GFRNONAA >60 >60 >60  ANIONGAP 8 7 5      Hematology Recent Labs  Lab 04/09/20 0341 04/10/20 0223 04/11/20 0209  WBC 6.8 5.7 6.9  RBC 5.11 4.82 4.74  HGB 14.1 13.1 13.1  HCT 42.2 41.2 40.1  MCV 82.6 85.5 84.6  MCH 27.6 27.2 27.6  MCHC 33.4 31.8 32.7  RDW 15.3 15.3 15.3  PLT 126* 95* 111*    BNP Recent Labs  Lab 04/05/20 0939  BNP 282.2*     DDimer  Recent Labs  Lab 04/09/20 0341 04/10/20 0223 04/11/20 0209  DDIMER 1.01* 0.87* 0.77*      Assessment & Plan    Atrial fibrillation RVR, Covid, CLL, dizziness  Dizziness -This has improved when I talked to her.  Orthostatics pending.  Continue  to hydrate orally.  Blood pressure fairly soft.  Paroxysmal atrial fibrillation -Much improved heart rate today.  Less than 2-second pauses overnight. -On metoprolol 100 twice daily as well as digoxin. -Avoiding amiodarone given hyperthyroidism  Covid infection -Per primary team.     For questions or updates, please contact [place] Please consult [email or web address] for contact info under        Signed, Candee Furbish, MD  04/11/2020, 10:43 AM

## 2020-04-11 NOTE — Care Management Important Message (Signed)
Important Message  Patient Details  Name: Tasha [name] MRN: [id number] Date of Birth: 1940/05/26   Medicare Important Message Given:  Yes - Important Message mailed due to current National Emergency   Verbal consent obtained due to current National Emergency  Relationship to patient: Self Contact Name: Jinnie Call Date: 04/11/20  Time: 1329 Phone: [phone] Outcome: Spoke with contact Important Message mailed to: Patient address on file    Delorse Lek 04/11/2020, 1:30 PM

## 2020-04-11 NOTE — Progress Notes (Signed)
PROGRESS NOTE                                                                                                                                                                                                             Patient Demographics:    Tasha [name], is a 80 y.o. female, DOB - [birth year], EXB:[id number]  Outpatient Primary MD for the patient is Lavone Orn, MD   Admit date - 04/04/2020   LOS - 6  Chief Complaint  Patient presents with  . Nausea  . Diarrhea       Brief Narrative: Patient is a 79 y.o. female with PMHx of multiple sclerosis, CLL, PAF-who presented with weakness and diarrhea-she was found to have COVID-19 infection.    Her husband had COVID-19 infection approximately 3 weeks ago-patient started having symptoms then-which mostly involved mild URI and diarrhea.  COVID-19 vaccinated status: Vaccinated  Significant Events: 3/18>> Admit to [facility] for Covid 19 infection/diarrhea/weakness  Significant studies: 3/18>>Chest x-ray: Bilateral pulmonary infiltrates.  COVID-19 medications: Steroids:3/20>>3/24  Antibiotics: Levaquin:3/20>>3/24  Microbiology data: 3/18 >>blood culture: No growth  Procedures: None  Consults: None  DVT prophylaxis:  apixaban (ELIQUIS) tablet 5 mg     Subjective:   Heart rate better but still in the low 100s.  Dizzy with sitting up this morning-orthostatic vital signs negative.   Assessment  & Plan :   Gastroenteritis: Has resolved-no diarrhea since admission-stool GI pathogen panel/C. difficile PCR never sent.  Doubt any utility of any stool studies at this point.  Pneumonia: Unclear whether this is COVID-19 related pneumonitis/inflammation or superimposed bacterial pneumonia.  Procalcitonin mildly elevated-but has down trended.  Has completed course of steroids and Levaquin.    Sepsis: Due to PNA/gastroenteritis-cultures negative so far-sepsis physiology has  significantly improved  PAF with RVR: Heart rate much better than yesterday but still in the low 100s-blood pressure soft-continue metoprolol and digoxin.  On Eliquis.  Cardiology following-we will await further recommendations.  RVR likely provoked by hypothyroidism and pneumonia.    Hyperthyroidism: Appears to be a new diagnosis-likely may have caused her RVR/diarrhea.  Stable on methimazole-spoke with her oncologist-Dr. Magrinat on 3/21-patient has an appointment in April 2022 for repeat CBC to monitor for agranulocytosis given history of underlying CLL.Marland Kitchen  Have discussed with patient's daughter over the past few days regarding outpatient follow-up with endocrinology for  further definitive treatment.  Leukopenia/thrombocytopenia: Probably due to COVID-19 infection-however has CLL-has improved.  See above regarding discussion with primary oncologist.  Hyponatremia: Continues to improve-likely due to diarrhea.  Follow electrolytes periodically.    Mildly prolonged QTC: Improved-K/Mg stable.    Orthostatic hypotension: Seems to have resolved-no symptoms when walking.  History of multiple sclerosis-at baseline ambulatory with a walker.  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  Obesity: Estimated body mass index is 32.67 kg/m as calculated from the following:   Height as of this encounter: 5\' 6"  (1.676 m).   Weight as of this encounter: 91.8 kg.    ABG: No results found for: PHART, PCO2ART, PO2ART, HCO3, TCO2, ACIDBASEDEF, O2SAT  Vent Settings: N/A  Condition - Stable  Family Communication  :Daughter-Tasha [name]-[phone] updated on 3/24  Code Status :  Full Code  Diet :  Diet Order            Diet Heart Room service appropriate? Yes; Fluid consistency: Thin  Diet effective now                  Disposition Plan  :   Status is: Inpatient  Remains inpatient appropriate because:Inpatient level of care appropriate due to severity of  illness   Dispo: The patient is from: Home              Anticipated d/c is to: Home              Patient currently is not medically stable to d/c.   Difficult to place patient No    Barriers to discharge: Pneumonia-on IV steroids/IV antibiotics-now with A. fib with RVR.  Antimicorbials  :    Anti-infectives (From admission, onward)   Start     Dose/Rate Route Frequency Ordered Stop   04/11/20 0800  levofloxacin (LEVAQUIN) tablet 750 mg        750 mg Oral Daily 04/10/20 0957 04/11/20 0940   04/07/20 0800  levofloxacin (LEVAQUIN) IVPB 750 mg  Status:  Discontinued        750 mg 100 mL/hr over 90 Minutes Intravenous Every 24 hours 04/07/20 0650 04/10/20 0957      Inpatient Medications  Scheduled Meds: . apixaban  5 mg Oral BID  . vitamin C  500 mg Oral Daily  . digoxin  0.125 mg Oral Daily  . famotidine  20 mg Oral BID  . feeding supplement  237 mL Oral BID BM  . insulin aspart  0-9 Units Subcutaneous TID WC  . methimazole  5 mg Oral BID  . metoprolol tartrate  100 mg Oral BID  . zinc sulfate  220 mg Oral Daily   Continuous Infusions:  PRN Meds:.acetaminophen, albuterol, loperamide, metoprolol tartrate, polyethylene glycol, trimethobenzamide   Time Spent in minutes  25  See all Orders from today for further details   Oren Binet M.D on 04/11/2020 at 11:55 AM  To page go to [email or web address] - use universal password  [facility] -  Office  [phone]    Objective:   Vitals:   04/10/20 1553 04/10/20 2008 04/11/20 0507 04/11/20 1133  BP:  (!) 124/96 107/68 94/61  Pulse: 88 100 84 84  Resp: 17  20 20   Temp: 98.2 F (36.8 C) 98.5 F (36.9 C) 97.7 F (36.5 C) 98.4 F (36.9 C)  TempSrc: Oral Oral Axillary Oral  SpO2: 100% 100% 98%   Weight:   91.8 kg   Height:        Wt  Readings from Last 3 Encounters:  04/11/20 91.8 kg  11/27/19 95.1 kg  10/11/19 95.6 kg     Intake/Output Summary (Last 24 hours) at 04/11/2020 1155 Last data filed at  04/11/2020 0522 Gross per 24 hour  Intake 120 ml  Output 800 ml  Net -680 ml     Physical Exam Gen Exam:Alert awake-not in any distress HEENT:atraumatic, normocephalic Chest: B/L clear to auscultation anteriorly CVS:S1S2 regular Abdomen:soft non tender, non distended Extremities:no edema Neurology: Non focal Skin: no rash   Data Review:    CBC Recent Labs  Lab 04/06/20 0241 04/07/20 0228 04/08/20 0412 04/09/20 0341 04/10/20 0223 04/11/20 0209  WBC 2.7* 1.7* 4.9 6.8 5.7 6.9  HGB 12.2 12.7 12.7 14.1 13.1 13.1  HCT 35.3* 37.1 37.7 42.2 41.2 40.1  PLT 77* 78* 97* 126* 95* 111*  MCV 81.5 81.0 82.3 82.6 85.5 84.6  MCH 28.2 27.7 27.7 27.6 27.2 27.6  MCHC 34.6 34.2 33.7 33.4 31.8 32.7  RDW 14.6 14.9 15.1 15.3 15.3 15.3  LYMPHSABS 0.7  --  1.4  --   --   --   MONOABS 0.3  --  0.3  --   --   --   EOSABS 0.0  --  0.0  --   --   --   BASOSABS 0.0  --  0.0  --   --   --     Chemistries  Recent Labs  Lab 04/06/20 0241 04/07/20 0228 04/08/20 0412 04/09/20 0341 04/10/20 0223  NA 134* 133* 133* 135 135  K 3.9 4.0 4.7 4.3 4.2  CL 102 102 102 101 102  CO2 25 22 23 27 28   GLUCOSE 135* 225* 238* 134* 152*  BUN 6* 11 13 14 13   CREATININE 0.66 0.58 0.60 0.79 0.60  CALCIUM 8.2* 8.5* 8.6* 8.7* 8.2*  MG 1.9 2.4 2.3 2.1 2.3  AST 23 18 21  33 21  ALT 27 26 26  35 30  ALKPHOS 41 40 41 45 45  BILITOT 0.7 0.6 0.6 0.6 0.6   ------------------------------------------------------------------------------------------------------------------ No results for input(s): CHOL, HDL, LDLCALC, TRIG, CHOLHDL, LDLDIRECT in the last 72 hours.  Lab Results  Component Value Date   HGBA1C 6.1 (H) 04/08/2020   ------------------------------------------------------------------------------------------------------------------ No results for input(s): TSH, T4TOTAL, T3FREE, THYROIDAB in the last 72 hours.  Invalid input(s):  FREET3 ------------------------------------------------------------------------------------------------------------------ No results for input(s): VITAMINB12, FOLATE, FERRITIN, TIBC, IRON, RETICCTPCT in the last 72 hours.  Coagulation profile No results for input(s): INR, PROTIME in the last 168 hours.  Recent Labs    04/10/20 0223 04/11/20 0209  DDIMER 0.87* 0.77*    Cardiac Enzymes No results for input(s): CKMB, TROPONINI, MYOGLOBIN in the last 168 hours.  Invalid input(s): CK ------------------------------------------------------------------------------------------------------------------    Component Value Date/Time   BNP 282.2 (H) 04/05/2020 1610    Micro Results Recent Results (from the past 240 hour(s))  Culture, blood (Routine X 2) w Reflex to ID Panel     Status: None   Collection Time: 04/05/20  9:50 AM   Specimen: BLOOD  Result Value Ref Range Status   Specimen Description BLOOD RIGHT ANTECUBITAL  Final   Special Requests   Final    BOTTLES DRAWN AEROBIC ONLY Blood Culture results may not be optimal due to an inadequate volume of blood received in culture bottles   Culture   Final    NO GROWTH 5 DAYS Performed at [facility], [place] [address] [place], [place] [address]    Report Status 04/10/2020  FINAL  Final  Culture, blood (Routine X 2) w Reflex to ID Panel     Status: None   Collection Time: 04/05/20  9:59 AM   Specimen: BLOOD RIGHT HAND  Result Value Ref Range Status   Specimen Description BLOOD RIGHT HAND  Final   Special Requests   Final    BOTTLES DRAWN AEROBIC ONLY Blood Culture results may not be optimal due to an inadequate volume of blood received in culture bottles   Culture   Final    NO GROWTH 5 DAYS Performed at [facility], [place] [address], [place], [place] [address]    Report Status 04/10/2020 FINAL  Final    Radiology Reports DG Chest Port 1 View  Result Date: 04/07/2020 CLINICAL DATA:  Shortness of breath. EXAM: PORTABLE  CHEST 1 VIEW COMPARISON:  Prior chest radiograph 04/05/2020 and earlier. FINDINGS: Mild cardiomegaly, unchanged. Aortic atherosclerosis. Patchy bilateral airspace disease, greater on the right, increased from the prior examination. Persistent small left pleural effusion. No evidence of pneumothorax IMPRESSION: Patchy bilateral airspace disease, greater on the right, increased from the prior examination. Persistent small left pleural effusion. Unchanged cardiomegaly. Electronically Signed   By: Kellie Simmering DO   On: 04/07/2020 08:01   DG Chest Port 1 View  Result Date: 04/05/2020 CLINICAL DATA:  Sob,COVID positive EXAM: PORTABLE CHEST 1 VIEW COMPARISON:  04/05/2019 FINDINGS: Stable cardiac silhouette. Normal increase in mild airspace disease. Mild increased central venous congestion. Small RIGHT effusion. IMPRESSION: Subtle increase in pulmonary airspace disease. Findings concerning for early pneumonia versus pulmonary edema. Electronically Signed   By: Suzy Bouchard M.D.   On: 04/05/2020 07:38   ECHOCARDIOGRAM COMPLETE  Result Date: 04/07/2020    ECHOCARDIOGRAM REPORT   Patient Name:   ANNISTYN DEPASS Date of Exam: 04/06/2020 Medical Rec #:  [id number]     Height:       66.0 in Accession #:    [id number]    Weight:       207.2 lb Date of Birth:  07-08-1940      BSA:          2.030 m Patient Age:    30 years      BP:           116/79 mmHg Patient Gender: F             HR:           109 bpm. Exam Location:  Inpatient Procedure: 2D Echo, Cardiac Doppler and Color Doppler Indications:    Acute Respiratory Distress  History:        Patient has prior history of Echocardiogram examinations, most                 recent 11/01/2017. Covid 19 positive.  Sonographer:    Merrie Roof RDCS Referring Phys: [place]  1. Left ventricular ejection fraction, by estimation, is 65 to 70%. The left ventricle has normal function. The left ventricle has no regional wall motion abnormalities. Left ventricular  diastolic function could not be evaluated.  2. Right ventricular systolic function is mildly reduced. The right ventricular size is mildly enlarged. There is mildly elevated pulmonary artery systolic pressure. The estimated right ventricular systolic pressure is 64.3 mmHg.  3. Left atrial size was mildly dilated.  4. Right atrial size was mildly dilated.  5. The mitral valve was not assessed. No evidence of mitral valve regurgitation. No evidence of mitral stenosis.  6. The  aortic valve was not assessed. Aortic valve regurgitation is not visualized. No aortic stenosis is present.  7. The inferior vena cava is normal in size with greater than 50% respiratory variability, suggesting right atrial pressure of 3 mmHg. FINDINGS  Left Ventricle: Left ventricular ejection fraction, by estimation, is 65 to 70%. The left ventricle has normal function. The left ventricle has no regional wall motion abnormalities. The left ventricular internal cavity size was normal in size. There is  no left ventricular hypertrophy. Left ventricular diastolic function could not be evaluated. Right Ventricle: The right ventricular size is mildly enlarged. No increase in right ventricular wall thickness. Right ventricular systolic function is mildly reduced. There is mildly elevated pulmonary artery systolic pressure. The tricuspid regurgitant  velocity is 2.77 m/s, and with an assumed right atrial pressure of 3 mmHg, the estimated right ventricular systolic pressure is 10.1 mmHg. Left Atrium: Left atrial size was mildly dilated. Right Atrium: Right atrial size was mildly dilated. Pericardium: There is no evidence of pericardial effusion. Mitral Valve: The mitral valve was not assessed. No evidence of mitral valve regurgitation. No evidence of mitral valve stenosis. Tricuspid Valve: The tricuspid valve is normal in structure. Tricuspid valve regurgitation is mild . No evidence of tricuspid stenosis. Aortic Valve: The aortic valve was not  assessed. Aortic valve regurgitation is not visualized. No aortic stenosis is present. Pulmonic Valve: The pulmonic valve was normal in structure. Pulmonic valve regurgitation is not visualized. No evidence of pulmonic stenosis. Aorta: The aortic root is normal in size and structure. Venous: The inferior vena cava is normal in size with greater than 50% respiratory variability, suggesting right atrial pressure of 3 mmHg. IAS/Shunts: No atrial level shunt detected by color flow Doppler.  RIGHT VENTRICLE RV Basal diam:  3.30 cm LEFT ATRIUM              Index       RIGHT ATRIUM           Index LA Vol (A2C):   125.0 ml 61.57 ml/m RA Area:     22.20 cm LA Vol (A4C):   77.1 ml  37.98 ml/m RA Volume:   75.00 ml  36.94 ml/m LA Biplane Vol: 99.5 ml  49.01 ml/m  TRICUSPID VALVE TR Peak grad:   30.7 mmHg TR Vmax:        277.00 cm/s Ena Dawley MD Electronically signed by Ena Dawley MD Signature Date/Time: 04/07/2020/12:14:58 PM    Final

## 2020-04-11 NOTE — Progress Notes (Signed)
Sent chat msg to Dr. Sloan Leiter:  Patient is complaining of dizziness (while sitting still in bed) and is diaphoretic. BP 107/68, HR 102 (up to 124), O2 100%, Resp. 17. Do you want a 12 lead EKG?   Dr. Sloan Leiter ordered stat EKG and also wans orthostatic BP's taken.  Spoke with Nicholaus Bloom who is completing both orthostatic BP's and EKG.

## 2020-04-12 ENCOUNTER — Other Ambulatory Visit: Payer: Self-pay | Admitting: Internal Medicine

## 2020-04-12 LAB — DIGOXIN LEVEL: Digoxin Level: 0.7 ng/mL — ABNORMAL LOW (ref 0.8–2.0)

## 2020-04-12 LAB — CBC
HCT: 41.6 % (ref 36.0–46.0)
Hemoglobin: 13.7 g/dL (ref 12.0–15.0)
MCH: 27.6 pg (ref 26.0–34.0)
MCHC: 32.9 g/dL (ref 30.0–36.0)
MCV: 83.7 fL (ref 80.0–100.0)
Platelets: 142 10*3/uL — ABNORMAL LOW (ref 150–400)
RBC: 4.97 MIL/uL (ref 3.87–5.11)
RDW: 15.3 % (ref 11.5–15.5)
WBC: 7 10*3/uL (ref 4.0–10.5)
nRBC: 0 % (ref 0.0–0.2)

## 2020-04-12 LAB — C-REACTIVE PROTEIN: CRP: 3.3 mg/dL — ABNORMAL HIGH (ref <1.0)

## 2020-04-12 LAB — GLUCOSE, CAPILLARY
Glucose-Capillary: 95 mg/dL (ref 70–99)
Glucose-Capillary: 138 mg/dL — ABNORMAL HIGH (ref 70–99)

## 2020-04-12 LAB — D-DIMER, QUANTITATIVE: D-Dimer, Quant: 0.77 ug/mL-FEU — ABNORMAL HIGH (ref 0.00–0.50)

## 2020-04-12 MED ORDER — METHIMAZOLE 5 MG PO TABS: 5.0000 mg | ORAL_TABLET | Freq: Two times a day (BID) | ORAL | 0 refills | Status: DC

## 2020-04-12 MED ORDER — DIGOXIN 125 MCG PO TABS: 0.1250 mg | ORAL_TABLET | Freq: Every day | ORAL | 0 refills | Status: DC

## 2020-04-12 MED ORDER — METOPROLOL TARTRATE 100 MG PO TABS
100.0000 mg | ORAL_TABLET | Freq: Two times a day (BID) | ORAL | 0 refills | Status: DC
Start: 2020-04-12 — End: 2020-04-29

## 2020-04-12 MED FILL — methIMAzole 5 MG TABS: 5 | 30 days supply | Qty: 60 | Fill #0

## 2020-04-12 MED FILL — DIGOXIN 0.125 MG TABLET: 125 | 30 days supply | Qty: 30 | Fill #0

## 2020-04-12 MED FILL — METOPROLOL TARTRATE 100 MG: 100 | 30 days supply | Qty: 60 | Fill #0

## 2020-04-12 NOTE — Progress Notes (Signed)
PT Cancellation Note  Patient Details Name: Tasha [name] MRN: [id number] DOB: 1940-02-21   Cancelled Treatment:    Reason Eval/Treat Not Completed: Patient declined, no reason specified. Pt with orders for d/c home today. Declining participation in therapy prior to d/c, stating "I just want to wait until I get home and walk with my own walker." Offered pt assist with transfer to recliner or amb to bathroom but pt continued to decline.   Lorriane Shire 04/12/2020, 11:40 AM   Lorrin Goodell, PT  Office # [phone] Pager [phone]

## 2020-04-12 NOTE — Plan of Care (Signed)

## 2020-04-12 NOTE — Progress Notes (Signed)
Progress Note  Patient Name: Tasha [name] Date of Encounter: 04/12/2020  [facility] Cardiologist: Sanda Klein, MD   Subjective   Feeling better this morning.  Heart rates currently in the 80s atrial fibrillation.  Much improved.  No chest pain.  Inpatient Medications    Scheduled Meds: . apixaban  5 mg Oral BID  . vitamin C  500 mg Oral Daily  . digoxin  0.125 mg Oral Daily  . famotidine  20 mg Oral BID  . feeding supplement  237 mL Oral BID BM  . insulin aspart  0-9 Units Subcutaneous TID WC  . methimazole  5 mg Oral BID  . metoprolol tartrate  100 mg Oral BID  . zinc sulfate  220 mg Oral Daily   Continuous Infusions:  PRN Meds: acetaminophen, albuterol, loperamide, metoprolol tartrate, polyethylene glycol, trimethobenzamide   Vital Signs    Vitals:   04/11/20 2132 04/12/20 0535 04/12/20 0707 04/12/20 0806  BP: 116/70 112/61  (!) 113/94  Pulse: 77 88  84  Resp:  20  19  Temp: 98.5 F (36.9 C) 98.4 F (36.9 C)  97.6 F (36.4 C)  TempSrc: Axillary Oral  Oral  SpO2: 98% 97%  94%  Weight:   88.6 kg   Height:        Intake/Output Summary (Last 24 hours) at 04/12/2020 0904 Last data filed at 04/12/2020 0809 Gross per 24 hour  Intake 240 ml  Output 1000 ml  Net -760 ml   Last 3 Weights 04/12/2020 04/11/2020 04/10/2020  Weight (lbs) 195 lb 5.2 oz 202 lb 6.1 oz 203 lb 4.2 oz  Weight (kg) 88.6 kg 91.8 kg 92.2 kg      Telemetry    Atrial fibrillation heart rate in the 80s.  No pauses.- Personally Reviewed  ECG    Atrial fibrillation- Personally Reviewed  Physical Exam   GEN: No acute distress.   Able to complete full sentences without difficulty.  Normal respiratory effort.  Labs    High Sensitivity Troponin:  No results for input(s): TROPONINIHS in the last 720 hours.    Chemistry Recent Labs  Lab 04/08/20 0412 04/09/20 0341 04/10/20 0223  NA 133* 135 135  K 4.7 4.3 4.2  CL 102 101 102  CO2 23 27 28   GLUCOSE 238* 134* 152*  BUN 13 14  13   CREATININE 0.60 0.79 0.60  CALCIUM 8.6* 8.7* 8.2*  PROT 4.7* 5.5* 4.5*  ALBUMIN 2.2* 2.5* 2.1*  AST 21 33 21  ALT 26 35 30  ALKPHOS 41 45 45  BILITOT 0.6 0.6 0.6  GFRNONAA >60 >60 >60  ANIONGAP 8 7 5      Hematology Recent Labs  Lab 04/10/20 0223 04/11/20 0209 04/12/20 0113  WBC 5.7 6.9 7.0  RBC 4.82 4.74 4.97  HGB 13.1 13.1 13.7  HCT 41.2 40.1 41.6  MCV 85.5 84.6 83.7  MCH 27.2 27.6 27.6  MCHC 31.8 32.7 32.9  RDW 15.3 15.3 15.3  PLT 95* 111* 142*    BNP Recent Labs  Lab 04/05/20 0939  BNP 282.2*     DDimer  Recent Labs  Lab 04/10/20 0223 04/11/20 0209 04/12/20 0113  DDIMER 0.87* 0.77* 0.77*     Radiology    No results found.  Cardiac Studies   Echo-normal EF  Patient Profile     80 y.o. female with atrial fibrillation rapid ventricular sponsor Covid infection CLL  Assessment & Plan    Paroxysmal atrial fibrillation -Once again, heart rate seems  to be much better control.  This is likely constellation of her improvement in relation to Covid as well as methimazole for treatment of hyperthyroidism. -Okay to continue with metoprolol tartrate 100 mg twice a day. -Continue with digoxin as well.  Chronic anticoagulation -Continue with Eliquis.  No evidence of bleeding.  Tolerating well.  Hyperthyroidism -Currently on methimazole 5 mg twice a day.  Continuing.  Outpatient follow-up with endocrinology.  Covid infection -Per primary team.  Seems to be improving.  Dizziness from yesterday -Resolved.  Likely result of being in bed quite a bit.  CLL -Platelet count range between 95 and 126.  Stable.  Recommend continued cardiac medications. No new recommendations at this time. Okay for discharge from a cardiac perspective.  Continue with outpatient follow-up.  Has an appoint with Dr. Lorenza Cambridge on 04/29/2020. We will go ahead and sign off.    For questions or updates, please contact [place] Please consult [email or web address] for contact  info under        Signed, Candee Furbish, MD  04/12/2020, 9:04 AM

## 2020-04-12 NOTE — TOC Initial Note (Signed)
Transition of Care [facility]) - Initial/Assessment Note    Patient Details  Name: Tasha [name] MRN: [id number] Date of Birth: Oct 17, 1940  Transition of [facility]) CM/SW Contact:    Zenon Mayo, RN Phone Number: 04/12/2020, 11:30 AM  Clinical Narrative:                 NCM notified [place] with [facility] of patient dc today. She is aware.  Expected Discharge Plan: [place] Barriers to Discharge: Continued Medical Work up   Patient Goals and CMS Choice Patient states their goals for this hospitalization and ongoing recovery are:: get better CMS Medicare.gov Compare Post Acute Care list provided to:: Patient Choice offered to / list presented to : Patient  Expected Discharge Plan and Services Expected Discharge Plan: [place] In-house Referral: NA Discharge Planning Services: CM Consult Post Acute Care Choice: [place] arrangements for the past 2 months: Single Family Home Expected Discharge Date: 04/12/20               DME Arranged: N/A DME Agency: NA       HH Arranged: PT HH Agency: [facility] ([facility] [employer]) Date [place]: 04/08/20 Time [place]: 85 Representative spoke with at [place]: [place]  Prior Living Arrangements/Services Living arrangements for the past 2 months: [place] with:: Spouse Patient language and need for interpreter reviewed:: Yes Do you feel safe going back to the place where you live?: Yes      Need for Family Participation in Patient Care: Yes (Comment) Care giver support system in place?: Yes (comment) Current home services: DME Criminal Activity/Legal Involvement Pertinent to Current Situation/Hospitalization: No - Comment as needed  Activities of Daily Living Home Assistive Devices/Equipment: Cane (specify quad or straight) ADL Screening (condition at time of admission) Patient's cognitive ability adequate to safely complete  daily activities?: Yes Is the patient deaf or have difficulty hearing?: No Does the patient have difficulty seeing, even when wearing glasses/contacts?: Yes Does the patient have difficulty concentrating, remembering, or making decisions?: No Patient able to express need for assistance with ADLs?: Yes Does the patient have difficulty dressing or bathing?: No Independently performs ADLs?: Yes (appropriate for developmental age) Does the patient have difficulty walking or climbing stairs?: No Weakness of Legs: Both Weakness of Arms/Hands: Both  Permission Sought/Granted Permission sought to share information with : Case Manager       Permission granted to share info w AGENCY: [place] [facility])        Emotional Assessment   Attitude/Demeanor/Rapport: Gracious   Orientation: : Oriented to Self,Oriented to Place,Oriented to  Time,Oriented to Situation Alcohol / Substance Use: Not Applicable Psych Involvement: No (comment)  Admission diagnosis:  Hypokalemia [E87.6] Hyponatremia [E87.1] Gastroenteritis due to 2019 novel coronavirus [U07.1, A08.39] COVID-19 virus infection [U07.1] Patient Active Problem List   Diagnosis Date Noted  . Gastroenteritis due to 2019 novel coronavirus 04/05/2020  . Prolonged QT interval 04/05/2020  . Sepsis ([place]) 04/05/2020  . Generalized weakness 04/05/2020  . Hypokalemia 04/05/2020  . Hyponatremia 04/05/2020  . Paroxysmal atrial fibrillation ([place]) 10/22/2017  . 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 Thrombocytopenia ([place]) 07/19/2017  . 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 Atrial tachycardia ([place]) 03/23/2013  . Vitamin D deficiency   . Vitamin B12 deficiency   . Hereditary and idiopathic peripheral neuropathy   . Gait disturbance  PCP:  Lavone Orn, MD Pharmacy:   [id number] [address], [place] [address] Phone: [phone] Fax: [place], [place] [address] [place] [address] Phone: [phone] Fax: [phone]     Social Determinants of Health (SDOH) Interventions    Readmission Risk Interventions No flowsheet data found.

## 2020-04-12 NOTE — Discharge Summary (Signed)
PATIENT DETAILS Name: Tasha [name] Age: 80 y.o. Sex: female Date of Birth: 23-Jun-1940 MRN: [id number]. Admitting Physician: Norval Morton, MD [id number], Jenny Reichmann, MD  Admit Date: 04/04/2020 Discharge date: 04/12/2020  Recommendations for Outpatient Follow-up:  1. Follow up with PCP in 1-2 weeks 2. Please obtain CMP/CBC in one week 3. Repeat Chest Xray in 4-6 week 4. Please ensure outpatient follow-up with endocrinology and oncology.  Admitted From:  Home  Disposition: [place]: Yes  Equipment/Devices: None  Discharge Condition: Stable  CODE STATUS: FULL CODE  Diet recommendation:  Diet Order            Diet - low sodium heart healthy           Diet Heart Room service appropriate? Yes; Fluid consistency: Thin  Diet effective now                  Brief Narrative: Patient is a 80 y.o. female with PMHx of multiple sclerosis, CLL, PAF-who presented with weakness and diarrhea-she was found to have COVID-19 infection.    Hospital course complicated by A. fib with RVR-she was also found to have new onset hypothyroidism.   Her husband had COVID-19 infection approximately 3 weeks ago-patient started having symptoms then-which mostly involved mild URI and diarrhea.  COVID-19 vaccinated status: Vaccinated  Significant Events: 3/18>> Admit to [facility] for Covid 19 infection/diarrhea/weakness  Significant studies: 3/18>>Chest x-ray: Bilateral pulmonary infiltrates.  COVID-19 medications: Steroids:3/20>>3/24  Antibiotics: Levaquin:3/20>>3/24  Microbiology data: 3/18 >>blood culture: No growth  Procedures: None  Consults: Cardiology, oncology   Brief Hospital Course: Gastroenteritis: Has resolved-no diarrhea since admission-stool GI pathogen panel/C. difficile PCR never sent.  Doubt any utility of any stool studies at this point.  Pneumonia: Unclear whether this is COVID-19 related pneumonitis/inflammation or superimposed bacterial  pneumonia.  Procalcitonin mildly elevated-but has down trended.  Has completed course of steroids and Levaquin.    Sepsis: Due to PNA/gastroenteritis-cultures negative so far-sepsis physiology has significantly improved  PAF with RVR: Heart rate much better-evaluated by cardiology-with recommendations to continue metoprolol and digoxin on discharge.  Remains on Eliquis.   RVR likely provoked by hypothyroidism and pneumonia.    Hyperthyroidism: Appears to be a new diagnosis-likely may have caused her RVR/diarrhea.  Stable on methimazole-spoke with her oncologist-Dr. Magrinat on 3/21-patient has an appointment in April 2022 for repeat CBC to monitor for agranulocytosis given history of underlying CLL.Marland Kitchen  Have discussed with patient's daughter over the past few days regarding outpatient follow-up with endocrinology for further definitive treatment.  Leukopenia/thrombocytopenia: Probably due to COVID-19 infection-however has CLL-has improved.  See above regarding discussion with primary oncologist.  Hyponatremia: Continues to improve-likely due to diarrhea.  Follow electrolytes periodically.    Mildly prolonged QTC: Improved-K/Mg stable.    Orthostatic hypotension: Seems to have resolved-no symptoms when walking.  History of multiple sclerosis-at baseline ambulatory with a walker.  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  Obesity: Estimated body mass index is 32.67 kg/m as calculated from the following:   Height as of this encounter: 5\' 6"  (1.676 m).   Weight as of this encounter: 91.8 kg.    Discharge Diagnoses:  Principal Problem:   Gastroenteritis due to 2019 novel coronavirus Active Problems:   Chronic lymphoid leukemia (HCC)   Thrombocytopenia (HCC)   Paroxysmal atrial fibrillation (HCC)   Prolonged QT interval   Sepsis (HCC)   Generalized weakness   Hypokalemia   Hyponatremia   Discharge Instructions:  Person Under Monitoring  Name: JAEMARIE HOCHBERG  Location: [place] [address]   Infection Prevention Recommendations for Individuals Confirmed to have, or Being Evaluated for, 2019 Novel Coronavirus (COVID-19) Infection Who Receive Care at Home  Individuals who are confirmed to have, or are being evaluated for, COVID-19 should follow the prevention steps below until a healthcare provider or local or state health department says they can return to normal activities.  Stay home except to get medical care You should restrict activities outside your home, except for getting medical care. Do not go to work, school, or public areas, and do not use public transportation or taxis.  Call ahead before visiting your doctor Before your medical appointment, call the healthcare provider and tell them that you have, or are being evaluated for, COVID-19 infection. This will help the healthcare provider's office take steps to keep other people from getting infected. Ask your healthcare provider to call the local or state health department.  Monitor your symptoms Seek prompt medical attention if your illness is worsening (e.g., difficulty breathing). Before going to your medical appointment, call the healthcare provider and tell them that you have, or are being evaluated for, COVID-19 infection. Ask your healthcare provider to call the local or state health department.  Wear a facemask You should wear a facemask that covers your nose and mouth when you are in the same room with other people and when you visit a healthcare provider. People who live with or visit you should also wear a facemask while they are in the same room with you.  Separate yourself from other people in your home As much as possible, you should stay in a different room from other people in your home. Also, you should use a separate bathroom, if available.  Avoid sharing household items You should not share dishes, drinking glasses,  cups, eating utensils, towels, bedding, or other items with other people in your home. After using these items, you should wash them thoroughly with soap and water.  Cover your coughs and sneezes Cover your mouth and nose with a tissue when you cough or sneeze, or you can cough or sneeze into your sleeve. Throw used tissues in a lined trash can, and immediately wash your hands with soap and water for at least 20 seconds or use an alcohol-based hand rub.  Wash your [facility] your hands often and thoroughly with soap and water for at least 20 seconds. You can use an alcohol-based hand sanitizer if soap and water are not available and if your hands are not visibly dirty. Avoid touching your eyes, nose, and mouth with unwashed hands.   Prevention Steps for Caregivers and Household Members of Individuals Confirmed to have, or Being Evaluated for, COVID-19 Infection Being Cared for in the Home  If you live with, or provide care at home for, a person confirmed to have, or being evaluated for, COVID-19 infection please follow these guidelines to prevent infection:  Follow healthcare provider's instructions Make sure that you understand and can help the patient follow any healthcare provider instructions for all care.  Provide for the patient's basic needs You should help the patient with basic needs in the home and provide support for getting groceries, prescriptions, and other personal needs.  Monitor the patient's symptoms If they are getting sicker, call his or her medical provider and tell them that the patient has, or is being evaluated for, COVID-19 infection. This will help the healthcare provider's office take  steps to keep other people from getting infected. Ask the healthcare provider to call the local or state health department.  Limit the number of people who have contact with the patient  If possible, have only one caregiver for the patient.  Other household members should  stay in another home or place of residence. If this is not possible, they should stay  in another room, or be separated from the patient as much as possible. Use a separate bathroom, if available.  Restrict visitors who do not have an essential need to be in the home.  Keep older adults, very young children, and other sick people away from the patient Keep older adults, very young children, and those who have compromised immune systems or chronic health conditions away from the patient. This includes people with chronic heart, lung, or kidney conditions, diabetes, and cancer.  Ensure good ventilation Make sure that shared spaces in the home have good air flow, such as from an air conditioner or an opened window, weather permitting.  Wash your hands often  Wash your hands often and thoroughly with soap and water for at least 20 seconds. You can use an alcohol based hand sanitizer if soap and water are not available and if your hands are not visibly dirty.  Avoid touching your eyes, nose, and mouth with unwashed hands.  Use disposable paper towels to dry your hands. If not available, use dedicated cloth towels and replace them when they become wet.  Wear a facemask and gloves  Wear a disposable facemask at all times in the room and gloves when you touch or have contact with the patient's blood, body fluids, and/or secretions or excretions, such as sweat, saliva, sputum, nasal mucus, vomit, urine, or feces.  Ensure the mask fits over your nose and mouth tightly, and do not touch it during use.  Throw out disposable facemasks and gloves after using them. Do not reuse.  Wash your hands immediately after removing your facemask and gloves.  If your personal clothing becomes contaminated, carefully remove clothing and launder. Wash your hands after handling contaminated clothing.  Place all used disposable facemasks, gloves, and other waste in a lined container before disposing them with other  household waste.  Remove gloves and wash your hands immediately after handling these items.  Do not share dishes, glasses, or other household items with the patient  Avoid sharing household items. You should not share dishes, drinking glasses, cups, eating utensils, towels, bedding, or other items with a patient who is confirmed to have, or being evaluated for, COVID-19 infection.  After the person uses these items, you should wash them thoroughly with soap and water.  Wash laundry thoroughly  Immediately remove and wash clothes or bedding that have blood, body fluids, and/or secretions or excretions, such as sweat, saliva, sputum, nasal mucus, vomit, urine, or feces, on them.  Wear gloves when handling laundry from the patient.  Read and follow directions on labels of laundry or clothing items and detergent. In general, wash and dry with the warmest temperatures recommended on the label.  Clean all areas the individual has used often  Clean all touchable surfaces, such as counters, tabletops, doorknobs, bathroom fixtures, toilets, phones, keyboards, tablets, and bedside tables, every day. Also, clean any surfaces that may have blood, body fluids, and/or secretions or excretions on them.  Wear gloves when cleaning surfaces the patient has come in contact with.  Use a diluted bleach solution (e.g., dilute bleach with 1 part bleach  and 10 parts water) or a household disinfectant with a label that says EPA-registered for coronaviruses. To make a bleach solution at home, add 1 tablespoon of bleach to 1 quart (4 cups) of water. For a larger supply, add  cup of bleach to 1 gallon (16 cups) of water.  Read labels of cleaning products and follow recommendations provided on product labels. Labels contain instructions for safe and effective use of the cleaning product including precautions you should take when applying the product, such as wearing gloves or eye protection and making sure you have  good ventilation during use of the product.  Remove gloves and wash hands immediately after cleaning.  Monitor yourself for signs and symptoms of illness Caregivers and household members are considered close contacts, should monitor their health, and will be asked to limit movement outside of the home to the extent possible. Follow the monitoring steps for close contacts listed on the symptom monitoring form.   ? If you have additional questions, contact your local health department or call the epidemiologist on call at [phone] (available 24/7). ? This guidance is subject to change. For the most up-to-date guidance from CDC, please refer to their website: YouBlogs.pl    Activity:  As tolerated with Full fall precautions use walker/cane & assistance as needed   Discharge Instructions    Diet - low sodium heart healthy   Complete by: As directed    Discharge instructions   Complete by: As directed    Follow with Primary MD  Lavone Orn, MD in 1-2 weeks  Follow with cardiology on 04/29/2020  Please make an appointment with endocrinologist of your choice.  Please follow-up with your oncologist-as instructed  Please get a complete blood count and chemistry panel checked by your Primary MD at your next visit, and again as instructed by your Primary MD.  Get Medicines reviewed and adjusted: Please take all your medications with you for your next visit with your Primary MD  Laboratory/radiological data: Please request your Primary MD to go over all hospital tests and procedure/radiological results at the follow up, please ask your Primary MD to get all Hospital records sent to his/her office.  In some cases, they will be blood work, cultures and biopsy results pending at the time of your discharge. Please request that your primary care M.D. follows up on these results.  Also Note the following: If you experience  worsening of your admission symptoms, develop shortness of breath, life threatening emergency, suicidal or homicidal thoughts you must seek medical attention immediately by calling 911 or calling your MD immediately  if symptoms less severe.  You must read complete instructions/literature along with all the possible adverse reactions/side effects for all the Medicines you take and that have been prescribed to you. Take any new Medicines after you have completely understood and accpet all the possible adverse reactions/side effects.   Do not drive when taking Pain medications or sleeping medications (Benzodaizepines)  Do not take more than prescribed Pain, Sleep and Anxiety Medications. It is not advisable to combine anxiety,sleep and pain medications without talking with your primary care practitioner  Special Instructions: If you have smoked or chewed Tobacco  in the last 2 yrs please stop smoking, stop any regular Alcohol  and or any Recreational drug use.  Wear Seat belts while driving.  Please note: You were cared for by a hospitalist during your hospital stay. Once you are discharged, your primary care physician will handle any further medical issues. Please  note that NO REFILLS for any discharge medications will be authorized once you are discharged, as it is imperative that you return to your primary care physician (or establish a relationship with a primary care physician if you do not have one) for your post hospital discharge needs so that they can reassess your need for medications and monitor your lab values.   Increase activity slowly   Complete by: As directed      Allergies as of 04/12/2020      Reactions   Clindamycin Itching, Rash   Celebrex [celecoxib] Other (See Comments)   Hypertension, possible TIA   Statins Other (See Comments)   Muscle aches   Tape    Patient CAN tolerate Coban Wrap only (NO TAPE!!)   Tricor [fenofibrate] Other (See Comments)   Elevated liver enzymes    Amoxicillin Rash   Clarithromycin Rash   Latex Rash      Medication List    TAKE these medications   apixaban 5 MG Tabs tablet Commonly known as: Eliquis TAKE 1 TABLET(5 MG) BY MOUTH TWICE DAILY What changed:   how much to take  how to take this  when to take this  additional instructions   cholecalciferol 1000 units tablet Commonly known as: VITAMIN D Take 1,000 Units by mouth daily.   digoxin 0.125 MG tablet Commonly known as: LANOXIN Take 1 tablet (0.125 mg total) by mouth daily. Start taking on: April 13, 2020   methimazole 5 MG tablet Commonly known as: TAPAZOLE Take 1 tablet (5 mg total) by mouth 2 (two) times daily.   metoprolol tartrate 100 MG tablet Commonly known as: LOPRESSOR Take 1 tablet (100 mg total) by mouth 2 (two) times daily. What changed:   medication strength  how much to take   VITAMIN B 12 PO Take 100 mg by mouth daily.       Follow-up Information    Lavone Orn, MD. Schedule an appointment as soon as possible for a visit in 1 week(s).   Specialty: Internal Medicine Contact information: [address], [address] [place] [address] [phone]        Sanda Klein, MD Follow up on 04/29/2020.   Specialty: Cardiology Contact information: [address] [place] [address] [phone]        Magrinat, Virgie Dad, MD Follow up.   Specialty: Oncology Why: office will call Contact information: [address] [place] [address] [phone]              Allergies  Allergen Reactions  . Clindamycin Itching and Rash  . Celebrex [Celecoxib] Other (See Comments)    Hypertension, possible TIA  . Statins Other (See Comments)    Muscle aches  . Tape     Patient CAN tolerate Coban Wrap only (NO TAPE!!)  . Tricor [Fenofibrate] Other (See Comments)    Elevated liver enzymes  . Amoxicillin Rash  . Clarithromycin Rash  . Latex Rash     Other Procedures/Studies: DG Chest  Port 1 View  Result Date: 04/07/2020 CLINICAL DATA:  Shortness of breath. EXAM: PORTABLE CHEST 1 VIEW COMPARISON:  Prior chest radiograph 04/05/2020 and earlier. FINDINGS: Mild cardiomegaly, unchanged. Aortic atherosclerosis. Patchy bilateral airspace disease, greater on the right, increased from the prior examination. Persistent small left pleural effusion. No evidence of pneumothorax IMPRESSION: Patchy bilateral airspace disease, greater on the right, increased from the prior examination. Persistent small left pleural effusion. Unchanged cardiomegaly. Electronically Signed   By: Kellie Simmering  DO   On: 04/07/2020 08:01   DG Chest Port 1 View  Result Date: 04/05/2020 CLINICAL DATA:  Sob,COVID positive EXAM: PORTABLE CHEST 1 VIEW COMPARISON:  04/05/2019 FINDINGS: Stable cardiac silhouette. Normal increase in mild airspace disease. Mild increased central venous congestion. Small RIGHT effusion. IMPRESSION: Subtle increase in pulmonary airspace disease. Findings concerning for early pneumonia versus pulmonary edema. Electronically Signed   By: Suzy Bouchard M.D.   On: 04/05/2020 07:38   ECHOCARDIOGRAM COMPLETE  Result Date: 04/07/2020    ECHOCARDIOGRAM REPORT   Patient Name:   AAMINA SKIFF Date of Exam: 04/06/2020 Medical Rec #:  [id number]     Height:       66.0 in Accession #:    [id number]    Weight:       207.2 lb Date of Birth:  07-05-1940      BSA:          2.030 m Patient Age:    43 years      BP:           116/79 mmHg Patient Gender: F             HR:           109 bpm. Exam Location:  Inpatient Procedure: 2D Echo, Cardiac Doppler and Color Doppler Indications:    Acute Respiratory Distress  History:        Patient has prior history of Echocardiogram examinations, most                 recent 11/01/2017. Covid 19 positive.  Sonographer:    Merrie Roof RDCS Referring Phys: [place]  1. Left ventricular ejection fraction, by estimation, is 65 to 70%. The left ventricle has  normal function. The left ventricle has no regional wall motion abnormalities. Left ventricular diastolic function could not be evaluated.  2. Right ventricular systolic function is mildly reduced. The right ventricular size is mildly enlarged. There is mildly elevated pulmonary artery systolic pressure. The estimated right ventricular systolic pressure is 21.1 mmHg.  3. Left atrial size was mildly dilated.  4. Right atrial size was mildly dilated.  5. The mitral valve was not assessed. No evidence of mitral valve regurgitation. No evidence of mitral stenosis.  6. The aortic valve was not assessed. Aortic valve regurgitation is not visualized. No aortic stenosis is present.  7. The inferior vena cava is normal in size with greater than 50% respiratory variability, suggesting right atrial pressure of 3 mmHg. FINDINGS  Left Ventricle: Left ventricular ejection fraction, by estimation, is 65 to 70%. The left ventricle has normal function. The left ventricle has no regional wall motion abnormalities. The left ventricular internal cavity size was normal in size. There is  no left ventricular hypertrophy. Left ventricular diastolic function could not be evaluated. Right Ventricle: The right ventricular size is mildly enlarged. No increase in right ventricular wall thickness. Right ventricular systolic function is mildly reduced. There is mildly elevated pulmonary artery systolic pressure. The tricuspid regurgitant  velocity is 2.77 m/s, and with an assumed right atrial pressure of 3 mmHg, the estimated right ventricular systolic pressure is 94.1 mmHg. Left Atrium: Left atrial size was mildly dilated. Right Atrium: Right atrial size was mildly dilated. Pericardium: There is no evidence of pericardial effusion. Mitral Valve: The mitral valve was not assessed. No evidence of mitral valve regurgitation. No evidence of mitral valve stenosis. Tricuspid Valve: The tricuspid valve is normal in structure. Tricuspid valve  regurgitation is mild . No evidence of tricuspid stenosis. Aortic Valve: The aortic valve was not assessed. Aortic valve regurgitation is not visualized. No aortic stenosis is present. Pulmonic Valve: The pulmonic valve was normal in structure. Pulmonic valve regurgitation is not visualized. No evidence of pulmonic stenosis. Aorta: The aortic root is normal in size and structure. Venous: The inferior vena cava is normal in size with greater than 50% respiratory variability, suggesting right atrial pressure of 3 mmHg. IAS/Shunts: No atrial level shunt detected by color flow Doppler.  RIGHT VENTRICLE RV Basal diam:  3.30 cm LEFT ATRIUM              Index       RIGHT ATRIUM           Index LA Vol (A2C):   125.0 ml 61.57 ml/m RA Area:     22.20 cm LA Vol (A4C):   77.1 ml  37.98 ml/m RA Volume:   75.00 ml  36.94 ml/m LA Biplane Vol: 99.5 ml  49.01 ml/m  TRICUSPID VALVE TR Peak grad:   30.7 mmHg TR Vmax:        277.00 cm/s Ena Dawley MD Electronically signed by Ena Dawley MD Signature Date/Time: 04/07/2020/12:14:58 PM    Final      TODAY-DAY OF DISCHARGE:  Subjective:   Teena Dunk today has no headache,no chest abdominal pain,no new weakness tingling or numbness, feels much better wants to go home today.  Objective:   Blood pressure (!) 113/94, pulse 84, temperature 97.6 F (36.4 C), temperature source Oral, resp. rate 19, height 5\' 6"  (1.676 m), weight 88.6 kg, SpO2 94 %.  Intake/Output Summary (Last 24 hours) at 04/12/2020 1105 Last data filed at 04/12/2020 0918 Gross per 24 hour  Intake 360 ml  Output 1000 ml  Net -640 ml   Filed Weights   04/10/20 0500 04/11/20 0507 04/12/20 0707  Weight: 92.2 kg 91.8 kg 88.6 kg    Exam: Awake Alert, Oriented *3, No new F.N deficits, Normal affect [place].AT,PERRAL Supple Neck,No JVD, No cervical lymphadenopathy appriciated.  Symmetrical Chest wall movement, Good air movement bilaterally, CTAB RRR,No Gallops,Rubs or new Murmurs, No Parasternal  Heave +ve B.Sounds, Abd Soft, Non tender, No organomegaly appriciated, No rebound -guarding or rigidity. No Cyanosis, Clubbing or edema, No new Rash or bruise   PERTINENT RADIOLOGIC STUDIES: DG Chest Port 1 View  Result Date: 04/07/2020 CLINICAL DATA:  Shortness of breath. EXAM: PORTABLE CHEST 1 VIEW COMPARISON:  Prior chest radiograph 04/05/2020 and earlier. FINDINGS: Mild cardiomegaly, unchanged. Aortic atherosclerosis. Patchy bilateral airspace disease, greater on the right, increased from the prior examination. Persistent small left pleural effusion. No evidence of pneumothorax IMPRESSION: Patchy bilateral airspace disease, greater on the right, increased from the prior examination. Persistent small left pleural effusion. Unchanged cardiomegaly. Electronically Signed   By: Kellie Simmering DO   On: 04/07/2020 08:01   DG Chest Port 1 View  Result Date: 04/05/2020 CLINICAL DATA:  Sob,COVID positive EXAM: PORTABLE CHEST 1 VIEW COMPARISON:  04/05/2019 FINDINGS: Stable cardiac silhouette. Normal increase in mild airspace disease. Mild increased central venous congestion. Small RIGHT effusion. IMPRESSION: Subtle increase in pulmonary airspace disease. Findings concerning for early pneumonia versus pulmonary edema. Electronically Signed   By: Suzy Bouchard M.D.   On: 04/05/2020 07:38   ECHOCARDIOGRAM COMPLETE  Result Date: 04/07/2020    ECHOCARDIOGRAM REPORT   Patient Name:   Tasha [name] Date of Exam: 04/06/2020 Medical Rec #:  [id number]  Height:       66.0 in Accession #:    [id number]    Weight:       207.2 lb Date of Birth:  1941/01/12      BSA:          2.030 m Patient Age:    59 years      BP:           116/79 mmHg Patient Gender: F             HR:           109 bpm. Exam Location:  Inpatient Procedure: 2D Echo, Cardiac Doppler and Color Doppler Indications:    Acute Respiratory Distress  History:        Patient has prior history of Echocardiogram examinations, most                 recent  11/01/2017. Covid 19 positive.  Sonographer:    Merrie Roof RDCS Referring Phys: [place]  1. Left ventricular ejection fraction, by estimation, is 65 to 70%. The left ventricle has normal function. The left ventricle has no regional wall motion abnormalities. Left ventricular diastolic function could not be evaluated.  2. Right ventricular systolic function is mildly reduced. The right ventricular size is mildly enlarged. There is mildly elevated pulmonary artery systolic pressure. The estimated right ventricular systolic pressure is 24.5 mmHg.  3. Left atrial size was mildly dilated.  4. Right atrial size was mildly dilated.  5. The mitral valve was not assessed. No evidence of mitral valve regurgitation. No evidence of mitral stenosis.  6. The aortic valve was not assessed. Aortic valve regurgitation is not visualized. No aortic stenosis is present.  7. The inferior vena cava is normal in size with greater than 50% respiratory variability, suggesting right atrial pressure of 3 mmHg. FINDINGS  Left Ventricle: Left ventricular ejection fraction, by estimation, is 65 to 70%. The left ventricle has normal function. The left ventricle has no regional wall motion abnormalities. The left ventricular internal cavity size was normal in size. There is  no left ventricular hypertrophy. Left ventricular diastolic function could not be evaluated. Right Ventricle: The right ventricular size is mildly enlarged. No increase in right ventricular wall thickness. Right ventricular systolic function is mildly reduced. There is mildly elevated pulmonary artery systolic pressure. The tricuspid regurgitant  velocity is 2.77 m/s, and with an assumed right atrial pressure of 3 mmHg, the estimated right ventricular systolic pressure is 80.9 mmHg. Left Atrium: Left atrial size was mildly dilated. Right Atrium: Right atrial size was mildly dilated. Pericardium: There is no evidence of pericardial effusion. Mitral  Valve: The mitral valve was not assessed. No evidence of mitral valve regurgitation. No evidence of mitral valve stenosis. Tricuspid Valve: The tricuspid valve is normal in structure. Tricuspid valve regurgitation is mild . No evidence of tricuspid stenosis. Aortic Valve: The aortic valve was not assessed. Aortic valve regurgitation is not visualized. No aortic stenosis is present. Pulmonic Valve: The pulmonic valve was normal in structure. Pulmonic valve regurgitation is not visualized. No evidence of pulmonic stenosis. Aorta: The aortic root is normal in size and structure. Venous: The inferior vena cava is normal in size with greater than 50% respiratory variability, suggesting right atrial pressure of 3 mmHg. IAS/Shunts: No atrial level shunt detected by color flow Doppler.  RIGHT VENTRICLE RV Basal diam:  3.30 cm LEFT ATRIUM  Index       RIGHT ATRIUM           Index LA Vol (A2C):   125.0 ml 61.57 ml/m RA Area:     22.20 cm LA Vol (A4C):   77.1 ml  37.98 ml/m RA Volume:   75.00 ml  36.94 ml/m LA Biplane Vol: 99.5 ml  49.01 ml/m  TRICUSPID VALVE TR Peak grad:   30.7 mmHg TR Vmax:        277.00 cm/s Ena Dawley MD Electronically signed by Ena Dawley MD Signature Date/Time: 04/07/2020/12:14:58 PM    Final      PERTINENT LAB RESULTS: CBC: Recent Labs    04/11/20 0209 04/12/20 0113  WBC 6.9 7.0  HGB 13.1 13.7  HCT 40.1 41.6  PLT 111* 142*   CMET CMP     Component Value Date/Time   NA 135 04/10/2020 0223   K 4.2 04/10/2020 0223   CL 102 04/10/2020 0223   CO2 28 04/10/2020 0223   GLUCOSE 152 (H) 04/10/2020 0223   BUN 13 04/10/2020 0223   CREATININE 0.60 04/10/2020 0223   CREATININE 0.74 01/31/2018 1358   CALCIUM 8.2 (L) 04/10/2020 0223   PROT 4.5 (L) 04/10/2020 0223   ALBUMIN 2.1 (L) 04/10/2020 0223   AST 21 04/10/2020 0223   AST 11 (L) 01/31/2018 1358   ALT 30 04/10/2020 0223   ALT 13 01/31/2018 1358   ALKPHOS 45 04/10/2020 0223   BILITOT 0.6 04/10/2020 0223    BILITOT 0.6 01/31/2018 1358   GFRNONAA >60 04/10/2020 0223   GFRNONAA >60 01/31/2018 1358   GFRAA >60 09/12/2019 0853   GFRAA >60 01/31/2018 1358    GFR Estimated Creatinine Clearance: 65 mL/min (by C-G formula based on SCr of 0.6 mg/dL). No results for input(s): LIPASE, AMYLASE in the last 72 hours. No results for input(s): CKTOTAL, CKMB, CKMBINDEX, TROPONINI in the last 72 hours. Invalid input(s): POCBNP Recent Labs    04/11/20 0209 04/12/20 0113  DDIMER 0.77* 0.77*   No results for input(s): HGBA1C in the last 72 hours. No results for input(s): CHOL, HDL, LDLCALC, TRIG, CHOLHDL, LDLDIRECT in the last 72 hours. No results for input(s): TSH, T4TOTAL, T3FREE, THYROIDAB in the last 72 hours.  Invalid input(s): FREET3 No results for input(s): VITAMINB12, FOLATE, FERRITIN, TIBC, IRON, RETICCTPCT in the last 72 hours. Coags: No results for input(s): INR in the last 72 hours.  Invalid input(s): PT Microbiology: Recent Results (from the past 240 hour(s))  Culture, blood (Routine X 2) w Reflex to ID Panel     Status: None   Collection Time: 04/05/20  9:50 AM   Specimen: BLOOD  Result Value Ref Range Status   Specimen Description BLOOD RIGHT ANTECUBITAL  Final   Special Requests   Final    BOTTLES DRAWN AEROBIC ONLY Blood Culture results may not be optimal due to an inadequate volume of blood received in culture bottles   Culture   Final    NO GROWTH 5 DAYS Performed at [facility], [place] [address], [place], [place] [address]    Report Status 04/10/2020 FINAL  Final  Culture, blood (Routine X 2) w Reflex to ID Panel     Status: None   Collection Time: 04/05/20  9:59 AM   Specimen: BLOOD RIGHT HAND  Result Value Ref Range Status   Specimen Description BLOOD RIGHT HAND  Final   Special Requests   Final    BOTTLES DRAWN AEROBIC ONLY Blood Culture results may  not be optimal due to an inadequate volume of blood received in culture bottles   Culture   Final    NO GROWTH 5  DAYS Performed at [facility], [place] [address] [place], [place] [address]    Report Status 04/10/2020 FINAL  Final    FURTHER DISCHARGE INSTRUCTIONS:  Get Medicines reviewed and adjusted: Please take all your medications with you for your next visit with your Primary MD  Laboratory/radiological data: Please request your Primary MD to go over all hospital tests and procedure/radiological results at the follow up, please ask your Primary MD to get all Hospital records sent to his/her office.  In some cases, they will be blood work, cultures and biopsy results pending at the time of your discharge. Please request that your primary care M.D. goes through all the records of your hospital data and follows up on these results.  Also Note the following: If you experience worsening of your admission symptoms, develop shortness of breath, life threatening emergency, suicidal or homicidal thoughts you must seek medical attention immediately by calling 911 or calling your MD immediately  if symptoms less severe.  You must read complete instructions/literature along with all the possible adverse reactions/side effects for all the Medicines you take and that have been prescribed to you. Take any new Medicines after you have completely understood and accpet all the possible adverse reactions/side effects.   Do not drive when taking Pain medications or sleeping medications (Benzodaizepines)  Do not take more than prescribed Pain, Sleep and Anxiety Medications. It is not advisable to combine anxiety,sleep and pain medications without talking with your primary care practitioner  Special Instructions: If you have smoked or chewed Tobacco  in the last 2 yrs please stop smoking, stop any regular Alcohol  and or any Recreational drug use.  Wear Seat belts while driving.  Please note: You were cared for by a hospitalist during your hospital stay. Once you are discharged, your primary care physician  will handle any further medical issues. Please note that NO REFILLS for any discharge medications will be authorized once you are discharged, as it is imperative that you return to your primary care physician (or establish a relationship with a primary care physician if you do not have one) for your post hospital discharge needs so that they can reassess your need for medications and monitor your lab values.  Total Time spent coordinating discharge including counseling, education and face to face time equals 35 minutes.  SignedOren Binet 04/12/2020 11:05 AM

## 2020-04-13 DIAGNOSIS — I480 Paroxysmal atrial fibrillation: Secondary | ICD-10-CM | POA: Diagnosis not present

## 2020-04-13 DIAGNOSIS — M21372 Foot drop, left foot: Secondary | ICD-10-CM | POA: Diagnosis not present

## 2020-04-13 DIAGNOSIS — G35 Multiple sclerosis: Secondary | ICD-10-CM | POA: Diagnosis not present

## 2020-04-13 DIAGNOSIS — E669 Obesity, unspecified: Secondary | ICD-10-CM | POA: Diagnosis not present

## 2020-04-13 DIAGNOSIS — E876 Hypokalemia: Secondary | ICD-10-CM | POA: Diagnosis not present

## 2020-04-13 DIAGNOSIS — D696 Thrombocytopenia, unspecified: Secondary | ICD-10-CM | POA: Diagnosis not present

## 2020-04-13 DIAGNOSIS — J1282 Pneumonia due to coronavirus disease 2019: Secondary | ICD-10-CM | POA: Diagnosis not present

## 2020-04-13 DIAGNOSIS — U071 COVID-19: Secondary | ICD-10-CM | POA: Diagnosis not present

## 2020-04-13 DIAGNOSIS — E559 Vitamin D deficiency, unspecified: Secondary | ICD-10-CM | POA: Diagnosis not present

## 2020-04-13 DIAGNOSIS — E538 Deficiency of other specified B group vitamins: Secondary | ICD-10-CM | POA: Diagnosis not present

## 2020-04-13 DIAGNOSIS — M21371 Foot drop, right foot: Secondary | ICD-10-CM | POA: Diagnosis not present

## 2020-04-13 DIAGNOSIS — I5030 Unspecified diastolic (congestive) heart failure: Secondary | ICD-10-CM | POA: Diagnosis not present

## 2020-04-13 DIAGNOSIS — E871 Hypo-osmolality and hyponatremia: Secondary | ICD-10-CM | POA: Diagnosis not present

## 2020-04-13 DIAGNOSIS — Z6832 Body mass index (BMI) 32.0-32.9, adult: Secondary | ICD-10-CM | POA: Diagnosis not present

## 2020-04-13 DIAGNOSIS — C9110 Chronic lymphocytic leukemia of B-cell type not having achieved remission: Secondary | ICD-10-CM | POA: Diagnosis not present

## 2020-04-13 DIAGNOSIS — M19049 Primary osteoarthritis, unspecified hand: Secondary | ICD-10-CM | POA: Diagnosis not present

## 2020-04-13 DIAGNOSIS — Z7901 Long term (current) use of anticoagulants: Secondary | ICD-10-CM | POA: Diagnosis not present

## 2020-04-13 DIAGNOSIS — G510 Bell's palsy: Secondary | ICD-10-CM | POA: Diagnosis not present

## 2020-04-13 DIAGNOSIS — G600 Hereditary motor and sensory neuropathy: Secondary | ICD-10-CM | POA: Diagnosis not present

## 2020-04-13 DIAGNOSIS — A4189 Other specified sepsis: Secondary | ICD-10-CM | POA: Diagnosis not present

## 2020-04-13 DIAGNOSIS — E0590 Thyrotoxicosis, unspecified without thyrotoxic crisis or storm: Secondary | ICD-10-CM | POA: Diagnosis not present

## 2020-04-16 DIAGNOSIS — G510 Bell's palsy: Secondary | ICD-10-CM | POA: Diagnosis not present

## 2020-04-16 DIAGNOSIS — U071 COVID-19: Secondary | ICD-10-CM | POA: Diagnosis not present

## 2020-04-16 DIAGNOSIS — I480 Paroxysmal atrial fibrillation: Secondary | ICD-10-CM | POA: Diagnosis not present

## 2020-04-16 DIAGNOSIS — J1282 Pneumonia due to coronavirus disease 2019: Secondary | ICD-10-CM | POA: Diagnosis not present

## 2020-04-16 DIAGNOSIS — I5030 Unspecified diastolic (congestive) heart failure: Secondary | ICD-10-CM | POA: Diagnosis not present

## 2020-04-16 DIAGNOSIS — A4189 Other specified sepsis: Secondary | ICD-10-CM | POA: Diagnosis not present

## 2020-04-18 DIAGNOSIS — G510 Bell's palsy: Secondary | ICD-10-CM | POA: Diagnosis not present

## 2020-04-18 DIAGNOSIS — I5030 Unspecified diastolic (congestive) heart failure: Secondary | ICD-10-CM | POA: Diagnosis not present

## 2020-04-18 DIAGNOSIS — A4189 Other specified sepsis: Secondary | ICD-10-CM | POA: Diagnosis not present

## 2020-04-18 DIAGNOSIS — I480 Paroxysmal atrial fibrillation: Secondary | ICD-10-CM | POA: Diagnosis not present

## 2020-04-18 DIAGNOSIS — J1282 Pneumonia due to coronavirus disease 2019: Secondary | ICD-10-CM | POA: Diagnosis not present

## 2020-04-18 DIAGNOSIS — U071 COVID-19: Secondary | ICD-10-CM | POA: Diagnosis not present

## 2020-04-22 DIAGNOSIS — K529 Noninfective gastroenteritis and colitis, unspecified: Secondary | ICD-10-CM | POA: Diagnosis not present

## 2020-04-22 DIAGNOSIS — Z8616 Personal history of COVID-19: Secondary | ICD-10-CM | POA: Diagnosis not present

## 2020-04-22 DIAGNOSIS — G609 Hereditary and idiopathic neuropathy, unspecified: Secondary | ICD-10-CM | POA: Diagnosis not present

## 2020-04-22 DIAGNOSIS — E0590 Thyrotoxicosis, unspecified without thyrotoxic crisis or storm: Secondary | ICD-10-CM | POA: Diagnosis not present

## 2020-04-22 DIAGNOSIS — C9110 Chronic lymphocytic leukemia of B-cell type not having achieved remission: Secondary | ICD-10-CM | POA: Diagnosis not present

## 2020-04-22 DIAGNOSIS — I482 Chronic atrial fibrillation, unspecified: Secondary | ICD-10-CM | POA: Diagnosis not present

## 2020-04-23 ENCOUNTER — Telehealth: Payer: Self-pay

## 2020-04-23 DIAGNOSIS — J1282 Pneumonia due to coronavirus disease 2019: Secondary | ICD-10-CM | POA: Diagnosis not present

## 2020-04-23 DIAGNOSIS — I5030 Unspecified diastolic (congestive) heart failure: Secondary | ICD-10-CM | POA: Diagnosis not present

## 2020-04-23 DIAGNOSIS — A4189 Other specified sepsis: Secondary | ICD-10-CM | POA: Diagnosis not present

## 2020-04-23 DIAGNOSIS — U071 COVID-19: Secondary | ICD-10-CM | POA: Diagnosis not present

## 2020-04-23 DIAGNOSIS — G510 Bell's palsy: Secondary | ICD-10-CM | POA: Diagnosis not present

## 2020-04-23 DIAGNOSIS — I480 Paroxysmal atrial fibrillation: Secondary | ICD-10-CM | POA: Diagnosis not present

## 2020-04-23 NOTE — Telephone Encounter (Signed)
Returned call to pt regarding timing of lab and MD appointment. Explained to pt that the labs she is having drawn will take more than a few hours to get results, so MD wanted them 2 days before. Pt verbalized understanding.

## 2020-04-26 DIAGNOSIS — G510 Bell's palsy: Secondary | ICD-10-CM | POA: Diagnosis not present

## 2020-04-26 DIAGNOSIS — A4189 Other specified sepsis: Secondary | ICD-10-CM | POA: Diagnosis not present

## 2020-04-26 DIAGNOSIS — U071 COVID-19: Secondary | ICD-10-CM | POA: Diagnosis not present

## 2020-04-26 DIAGNOSIS — J1282 Pneumonia due to coronavirus disease 2019: Secondary | ICD-10-CM | POA: Diagnosis not present

## 2020-04-26 DIAGNOSIS — I480 Paroxysmal atrial fibrillation: Secondary | ICD-10-CM | POA: Diagnosis not present

## 2020-04-26 DIAGNOSIS — I5030 Unspecified diastolic (congestive) heart failure: Secondary | ICD-10-CM | POA: Diagnosis not present

## 2020-04-29 ENCOUNTER — Encounter: Payer: Self-pay | Admitting: Cardiovascular Disease

## 2020-04-29 ENCOUNTER — Other Ambulatory Visit: Payer: Self-pay

## 2020-04-29 ENCOUNTER — Ambulatory Visit (INDEPENDENT_AMBULATORY_CARE_PROVIDER_SITE_OTHER): Payer: Medicare Other | Admitting: Cardiovascular Disease

## 2020-04-29 VITALS — BP 118/54 | HR 93 | Ht 66.0 in | Wt 198.0 lb

## 2020-04-29 DIAGNOSIS — Z8616 Personal history of COVID-19: Secondary | ICD-10-CM | POA: Diagnosis not present

## 2020-04-29 DIAGNOSIS — E669 Obesity, unspecified: Secondary | ICD-10-CM | POA: Diagnosis not present

## 2020-04-29 DIAGNOSIS — R7303 Prediabetes: Secondary | ICD-10-CM | POA: Diagnosis not present

## 2020-04-29 DIAGNOSIS — Z01812 Encounter for preprocedural laboratory examination: Secondary | ICD-10-CM | POA: Diagnosis not present

## 2020-04-29 DIAGNOSIS — I480 Paroxysmal atrial fibrillation: Secondary | ICD-10-CM

## 2020-04-29 DIAGNOSIS — I4819 Other persistent atrial fibrillation: Secondary | ICD-10-CM | POA: Diagnosis not present

## 2020-04-29 DIAGNOSIS — R946 Abnormal results of thyroid function studies: Secondary | ICD-10-CM

## 2020-04-29 MED ORDER — ELIQUIS 5 MG PO TABS: ORAL_TABLET | ORAL | 1 refills | Status: DC

## 2020-04-29 MED ORDER — DIGOXIN 125 MCG PO TABS: 0.1250 mg | ORAL_TABLET | Freq: Every day | ORAL | 0 refills | Status: DC

## 2020-04-29 MED ORDER — METOPROLOL TARTRATE 100 MG PO TABS: 100.0000 mg | ORAL_TABLET | Freq: Two times a day (BID) | ORAL | 2 refills | Status: DC

## 2020-04-29 NOTE — Progress Notes (Signed)
Cardiology Office Note:    Date:  04/30/2020   ID:  Tasha [name], DOB December 06, 1940, MRN [id number]  PCP:  Lavone Orn, MD  Cardiologist:  Sanda Klein, MD    Referring MD: Lavone Orn, MD   Chief complaint: atrial fibrillation   History of Present Illness:    Tasha [name] is a 80 y.o. female with a hx of paroxysmal atrial tachycardia, and atrial fibrillation.  Additional medical problems include CLL with mild thrombocytopenia, multiple sclerosis with very infrequent problems, ocular migraines and idiopathic peripheral neuropathy.   He was hospitalized 03 17-03 25 with Covid pneumonia and hypoxemia, despite the fact that she had received 2 vaccine doses in a booster.  She was also hyponatremic during that admission and was found to be in persistent atrial fibrillation.  Rate control was challenging and digoxin was added to metoprolol.  TSH was suppressed and free T4 was mildly elevated so she was started on methimazole and is referred to see an endocrinologist (this will not happen until June 13).  An echocardiogram during that hospitalization showed that LVEF remains normal at 65 to 70%.  The left atrium was described as mildly dilated.  There was no significant valvular abnormality detected.  She is still feeling weak since her hospitalization but has no dyspnea at rest or with light activity.  She denies orthopnea, PND or lower extremity edema.  She has not had any falls or injuries.  She is compliant with Eliquis anticoagulation and has not had any serious bleeding issues.  She does not have known structural heart disease. She had a normal echo in 2013 at [facility] (normal LV systolic function, mild LVH with septal wall thickness 1.2 cm, borderline left atrial dilation at 3.9 cm, 25 cm). She does not have known coronary artery problems.  Follow-up echo March 2022 showed EF 65 to 70%, mildly dilated left atrium.  Past Medical History:  Diagnosis Date  . Atrial fibrillation ([place])     questionable, echo 03/23/11 - EF >55%, on warfarin  . Bell's palsy   . 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 Charcot-Marie disease   . 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 Gait disturbance   . Gallstones   . 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 IFG (impaired fasting glucose)    e  . Personal history of other diseases of circulatory system   . Tick bite   . Unspecified hereditary and idiopathic peripheral neuropathy   . Vitamin B12 deficiency   . Vitamin D deficiency     Past Surgical History:  Procedure Laterality Date  . ABDOMINAL HYSTERECTOMY  1984   fibroids  . BREAST SURGERY  1960s   benign breast tumors  . CATARACT EXTRACTION W/ INTRAOCULAR LENS  IMPLANT, BILATERAL    . LAPAROSCOPIC BILATERAL SALPINGO OOPHERECTOMY  2003   ovarian cyst    Current Medications: Current Meds  Medication Sig  . cholecalciferol (VITAMIN D) 1000 units tablet Take 1,000 Units by mouth daily.  . Cyanocobalamin (VITAMIN B 12 PO) Take 100 mg by mouth daily.   . methimazole (TAPAZOLE) 5 MG tablet Take 1 tablet (5 mg total) by mouth 2 (two) times daily.  . [DISCONTINUED] apixaban (ELIQUIS) 5 MG TABS tablet TAKE 1 TABLET(5 MG) BY MOUTH TWICE DAILY (Patient taking differently: Take 5 mg by mouth 2 (two) times daily.)  . [DISCONTINUED] digoxin (LANOXIN) 0.125 MG tablet Take 1 tablet (0.125 mg total) by mouth daily.  . [DISCONTINUED] metoprolol tartrate (LOPRESSOR)  100 MG tablet Take 1 tablet (100 mg total) by mouth 2 (two) times daily.     Allergies:   Clindamycin, Celebrex [celecoxib], Statins, Tape, Tricor [fenofibrate], Amoxicillin, Clarithromycin, and Latex   Social History   Socioeconomic History  . Marital status: Married    Spouse name: Colen  . Number of children: 3  . Years of education: college  . Highest education level: Not on file  Occupational History    Comment: Retired  Tobacco Use  . Smoking status: Never Smoker  . Smokeless tobacco:  Never Used  Substance and Sexual Activity  . Alcohol use: No    Alcohol/week: 0.0 standard drinks  . Drug use: No  . Sexual activity: Not on file  Other Topics Concern  . 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 Patient has college education.    Right handed.   Caffeine- not every day . Soda or tea when she is out for dinner.   Social Determinants of Health   Financial Resource Strain: Not on file  Food Insecurity: Not on file  Transportation Needs: Not on file  Physical Activity: Not on file  Stress: Not on file  Social Connections: Not on file     Family History: The patient's family history includes Cancer in her daughter; Coronary artery disease in her brother and father; Diabetes in her brother and father; Hypertension in her brother, father, and mother. ROS:   Please see the history of present illness.    All other systems are reviewed and are negative EKGs/Labs/Other Studies Reviewed:   11/01/2017 echo - Left ventricle: The cavity size was normal. There was severe   concentric hypertrophy. Systolic function was vigorous. The   estimated ejection fraction was in the range of 65% to 70%. Wall   motion was normal; there were no regional wall motion   abnormalities. Doppler parameters are consistent with abnormal   left ventricular relaxation (grade 1 diastolic dysfunction). - Aortic valve: Trileaflet; mildly thickened leaflets. - Mitral valve: Moderately thickened, mildly calcified leaflets . - Right ventricle: Systolic function was normal. - Right atrium: The atrium was normal in size. - Tricuspid valve: There was mild regurgitation. - Pulmonary arteries: Systolic pressure was within the normal   range. - Pericardium, extracardiac: There was no pericardial effusion.  Echo 04/07/2018 1. Left ventricular ejection fraction, by estimation, is 65 to 70%. The  left ventricle has normal function. The left ventricle has no  regional  wall motion abnormalities. Left ventricular diastolic function could not  be evaluated.  2. Right ventricular systolic function is mildly reduced. The right  ventricular size is mildly enlarged. There is mildly elevated pulmonary  artery systolic pressure. The estimated right ventricular systolic  pressure is 08.6 mmHg.  3. Left atrial size was mildly dilated.  4. Right atrial size was mildly dilated.  5. The mitral valve was not assessed. No evidence of mitral valve  regurgitation. No evidence of mitral stenosis.  6. The aortic valve was not assessed. Aortic valve regurgitation is not  visualized. No aortic stenosis is present.  7. The inferior vena cava is normal in size with greater than 50%  respiratory variability, suggesting right atrial pressure of 3 mmHg.  EKG:  EKG is ordered today.  It shows coarse atrial fibrillation with borderline rate control. Recent Labs: 04/05/2020: B Natriuretic Peptide 282.2 04/07/2020: TSH 0.121 04/10/2020: ALT 30; BUN 13; Creatinine, Ser 0.60; Magnesium 2.3; Potassium 4.2; Sodium 135  04/12/2020: Hemoglobin 13.7; Platelets 142  Recent Lipid Panel No results found for: CHOL, TRIG, HDL, CHOLHDL, VLDL, LDLCALC, LDLDIRECT  Physical Exam:    VS:  BP (!) 118/54   Pulse 93   Ht 5\' 6"  (1.676 m)   Wt 198 lb (89.8 kg)   SpO2 96%   BMI 31.96 kg/m     Wt Readings from Last 3 Encounters:  04/29/20 198 lb (89.8 kg)  04/12/20 195 lb 5.2 oz (88.6 kg)  11/27/19 209 lb 9.6 oz (95.1 kg)    General: Alert, oriented x3, no distress, obese Head: no evidence of trauma, PERRL, EOMI, no exophtalmos or lid lag, no myxedema, no xanthelasma; normal ears, nose and oropharynx Neck: normal jugular venous pulsations and no hepatojugular reflux; brisk carotid pulses without delay and no carotid bruits Chest: clear to auscultation, no signs of consolidation by percussion or palpation, normal fremitus, symmetrical and full respiratory  excursions Cardiovascular: normal position and quality of the apical impulse, ir she was hospitalized, regular rhythm, normal first and second heart sounds, no murmurs, rubs or gallops Abdomen: no tenderness or distention, no masses by palpation, no abnormal pulsatility or arterial bruits, normal bowel sounds, no hepatosplenomegaly Extremities: no clubbing, cyanosis or edema; 2+ radial, ulnar and brachial pulses bilaterally; 2+ right femoral, posterior tibial and dorsalis pedis pulses; 2+ left femoral, posterior tibial and dorsalis pedis pulses; no subclavian or femoral bruits Neurological: grossly nonfocal Psych: Normal mood and affect    ASSESSMENT:    1. Persistent atrial fibrillation ([place])   2. Abnormal thyroid function test   3. Prediabetes   4. Mild obesity   5. History of COVID-19   6. Pre-procedure lab exam    PLAN:    In order of problems listed above:  1. AFib: This is her first episode of persistent atrial fibrillation.  She has a good chance of return to normal rhythm since her left atrium is only mildly dilated then she does not have much in the way of structural heart disease.  She has been on uninterrupted anticoagulation.  I believe she should undergo cardioversion.  I do not think she will need antiarrhythmics at this point.  This procedure has been fully reviewed with the patient and written informed consent has been obtained.  Underlying the importance to not interrupt anticoagulation for 3 weeks leading up to the procedure and the 1 month following it. CHADSVasc 3 (age 44, gender).  If cardioversion is successful and A. fib stays away, we will plan to stop her digoxin.  In the past she had difficulty tolerating rate control medications due to low blood pressure, but seems to be doing reasonably well on the current dose of metoprolol. 2. Abnormal thyroid tests: Could well be caused by sick euthyroid syndrome rather than true thyrotoxicosis.  We will recheck her TSH and free  T4.  She has an endocrinology appointment already scheduled in June. 3. Prediabetes: Hemoglobin A1c was 6.1%, without medical therapy. 4. Mild obesity: Weight loss would help with correcting her metabolic abnormalities. 5. Recent COVID-19 pneumonia: Briefly required oxygen, seems to have recovered from it.  She will not need to be retested for coronavirus before her cardioversion.   Medication Adjustments/Labs and Tests Ordered: Current medicines are reviewed at length with the patient today.  Concerns regarding medicines are outlined above.  Orders Placed This Encounter  Procedures  . Basic metabolic panel  . CBC  . EKG 12-Lead   Meds ordered this encounter  Medications  . digoxin (LANOXIN)  0.125 MG tablet    Sig: Take 1 tablet (0.125 mg total) by mouth daily.    Dispense:  30 tablet    Refill:  0  . metoprolol tartrate (LOPRESSOR) 100 MG tablet    Sig: Take 1 tablet (100 mg total) by mouth 2 (two) times daily.    Dispense:  60 tablet    Refill:  2  . apixaban (ELIQUIS) 5 MG TABS tablet    Sig: TAKE 1 TABLET(5 MG) BY MOUTH TWICE DAILY    Dispense:  180 tablet    Refill:  1   STOP ASPIRIN   Signed, Sanda Klein, MD  04/30/2020 9:14 AM    [place]

## 2020-04-29 NOTE — H&P (View-Only) (Signed)
Cardiology Office Note:    Date:  04/30/2020   ID:  Tasha [name], DOB [birth year], MRN [id number]  PCP:  Tasha Orn, MD  Cardiologist:  Tasha Klein, MD    Referring MD: Tasha Orn, MD   Chief complaint: atrial fibrillation   History of Present Illness:    Tasha [name] is a 80 y.o. female with a hx of paroxysmal atrial tachycardia, and atrial fibrillation.  Additional medical problems include CLL with mild thrombocytopenia, multiple sclerosis with very infrequent problems, ocular migraines and idiopathic peripheral neuropathy.   He was hospitalized 03 17-03 25 with Covid pneumonia and hypoxemia, despite the fact that she had received 2 vaccine doses in a booster.  She was also hyponatremic during that admission and was found to be in persistent atrial fibrillation.  Rate control was challenging and digoxin was added to metoprolol.  TSH was suppressed and free T4 was mildly elevated so she was started on methimazole and is referred to see an endocrinologist (this will not happen until June 13).  An echocardiogram during that hospitalization showed that LVEF remains normal at 65 to 70%.  The left atrium was described as mildly dilated.  There was no significant valvular abnormality detected.  She is still feeling weak since her hospitalization but has no dyspnea at rest or with light activity.  She denies orthopnea, PND or lower extremity edema.  She has not had any falls or injuries.  She is compliant with Eliquis anticoagulation and has not had any serious bleeding issues.  She does not have known structural heart disease. She had a normal echo in 2013 at [place] (normal LV systolic function, mild LVH with septal wall thickness 1.2 cm, borderline left atrial dilation at 3.9 cm, 25 cm). She does not have known coronary artery problems.  Follow-up echo March 2022 showed EF 65 to 70%, mildly dilated left atrium.  Past Medical History:  Diagnosis Date  . Atrial fibrillation ([place])     questionable, echo 03/23/11 - EF >55%, on warfarin  . Bell's palsy   . 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 Charcot-Marie disease   . 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 Gait disturbance   . Gallstones   . 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 IFG (impaired fasting glucose)    e  . Personal history of other diseases of circulatory system   . Tick bite   . Unspecified hereditary and idiopathic peripheral neuropathy   . Vitamin B12 deficiency   . Vitamin D deficiency     Past Surgical History:  Procedure Laterality Date  . ABDOMINAL HYSTERECTOMY  1984   fibroids  . BREAST SURGERY  1960s   benign breast tumors  . CATARACT EXTRACTION W/ INTRAOCULAR LENS  IMPLANT, BILATERAL    . LAPAROSCOPIC BILATERAL SALPINGO OOPHERECTOMY  2003   ovarian cyst    Current Medications: Current Meds  Medication Sig  . cholecalciferol (VITAMIN D) 1000 units tablet Take 1,000 Units by mouth daily.  . Cyanocobalamin (VITAMIN B 12 PO) Take 100 mg by mouth daily.   . methimazole (TAPAZOLE) 5 MG tablet Take 1 tablet (5 mg total) by mouth 2 (two) times daily.  . [DISCONTINUED] apixaban (ELIQUIS) 5 MG TABS tablet TAKE 1 TABLET(5 MG) BY MOUTH TWICE DAILY (Patient taking differently: Take 5 mg by mouth 2 (two) times daily.)  . [DISCONTINUED] digoxin (LANOXIN) 0.125 MG tablet Take 1 tablet (0.125 mg total) by mouth daily.  . [DISCONTINUED] metoprolol tartrate (LOPRESSOR)  100 MG tablet Take 1 tablet (100 mg total) by mouth 2 (two) times daily.     Allergies:   Clindamycin, Celebrex [celecoxib], Statins, Tape, Tricor [fenofibrate], Amoxicillin, Clarithromycin, and Latex   Social History   Socioeconomic History  . Marital status: Married    Spouse name: Tasha [name]  . Number of children: 3  . Years of education: college  . Highest education level: Not on file  Occupational History    Comment: Retired  Tobacco Use  . Smoking status: Never Smoker  . Smokeless tobacco:  Never Used  Substance and Sexual Activity  . Alcohol use: No    Alcohol/week: 0.0 standard drinks  . Drug use: No  . Sexual activity: Not on file  Other Topics Concern  . 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 Patient has college education.    Right handed.   Caffeine- not every day . Soda or tea when she is out for dinner.   Social Determinants of Health   Financial Resource Strain: Not on file  Food Insecurity: Not on file  Transportation Needs: Not on file  Physical Activity: Not on file  Stress: Not on file  Social Connections: Not on file     Family History: The patient's family history includes Cancer in her daughter; Coronary artery disease in her brother and father; Diabetes in her brother and father; Hypertension in her brother, father, and mother. ROS:   Please see the history of present illness.    All other systems are reviewed and are negative EKGs/Labs/Other Studies Reviewed:   11/01/2017 echo - Left ventricle: The cavity size was normal. There was severe   concentric hypertrophy. Systolic function was vigorous. The   estimated ejection fraction was in the range of 65% to 70%. Wall   motion was normal; there were no regional wall motion   abnormalities. Doppler parameters are consistent with abnormal   left ventricular relaxation (grade 1 diastolic dysfunction). - Aortic valve: Trileaflet; mildly thickened leaflets. - Mitral valve: Moderately thickened, mildly calcified leaflets . - Right ventricle: Systolic function was normal. - Right atrium: The atrium was normal in size. - Tricuspid valve: There was mild regurgitation. - Pulmonary arteries: Systolic pressure was within the normal   range. - Pericardium, extracardiac: There was no pericardial effusion.  Echo 04/07/2018 1. Left ventricular ejection fraction, by estimation, is 65 to 70%. The  left ventricle has normal function. The left ventricle has no  regional  wall motion abnormalities. Left ventricular diastolic function could not  be evaluated.  2. Right ventricular systolic function is mildly reduced. The right  ventricular size is mildly enlarged. There is mildly elevated pulmonary  artery systolic pressure. The estimated right ventricular systolic  pressure is 02.4 mmHg.  3. Left atrial size was mildly dilated.  4. Right atrial size was mildly dilated.  5. The mitral valve was not assessed. No evidence of mitral valve  regurgitation. No evidence of mitral stenosis.  6. The aortic valve was not assessed. Aortic valve regurgitation is not  visualized. No aortic stenosis is present.  7. The inferior vena cava is normal in size with greater than 50%  respiratory variability, suggesting right atrial pressure of 3 mmHg.  EKG:  EKG is ordered today.  It shows coarse atrial fibrillation with borderline rate control. Recent Labs: 04/05/2020: B Natriuretic Peptide 282.2 04/07/2020: TSH 0.121 04/10/2020: ALT 30; BUN 13; Creatinine, Ser 0.60; Magnesium 2.3; Potassium 4.2; Sodium 135  04/12/2020: Hemoglobin 13.7; Platelets 142  Recent Lipid Panel No results found for: CHOL, TRIG, HDL, CHOLHDL, VLDL, LDLCALC, LDLDIRECT  Physical Exam:    VS:  BP (!) 118/54   Pulse 93   Ht 5\' 6"  (1.676 m)   Wt 198 lb (89.8 kg)   SpO2 96%   BMI 31.96 kg/m     Wt Readings from Last 3 Encounters:  04/29/20 198 lb (89.8 kg)  04/12/20 195 lb 5.2 oz (88.6 kg)  11/27/19 209 lb 9.6 oz (95.1 kg)    General: Alert, oriented x3, no distress, obese Head: no evidence of trauma, PERRL, EOMI, no exophtalmos or lid lag, no myxedema, no xanthelasma; normal ears, nose and oropharynx Neck: normal jugular venous pulsations and no hepatojugular reflux; brisk carotid pulses without delay and no carotid bruits Chest: clear to auscultation, no signs of consolidation by percussion or palpation, normal fremitus, symmetrical and full respiratory  excursions Cardiovascular: normal position and quality of the apical impulse, ir she was hospitalized, regular rhythm, normal first and second heart sounds, no murmurs, rubs or gallops Abdomen: no tenderness or distention, no masses by palpation, no abnormal pulsatility or arterial bruits, normal bowel sounds, no hepatosplenomegaly Extremities: no clubbing, cyanosis or edema; 2+ radial, ulnar and brachial pulses bilaterally; 2+ right femoral, posterior tibial and dorsalis pedis pulses; 2+ left femoral, posterior tibial and dorsalis pedis pulses; no subclavian or femoral bruits Neurological: grossly nonfocal Psych: Normal mood and affect    ASSESSMENT:    1. Persistent atrial fibrillation ([place])   2. Abnormal thyroid function test   3. Prediabetes   4. Mild obesity   5. History of COVID-19   6. Pre-procedure lab exam    PLAN:    In order of problems listed above:  1. AFib: This is her first episode of persistent atrial fibrillation.  She has a good chance of return to normal rhythm since her left atrium is only mildly dilated then she does not have much in the way of structural heart disease.  She has been on uninterrupted anticoagulation.  I believe she should undergo cardioversion.  I do not think she will need antiarrhythmics at this point.  This procedure has been fully reviewed with the patient and written informed consent has been obtained.  Underlying the importance to not interrupt anticoagulation for 3 weeks leading up to the procedure and the 1 month following it. CHADSVasc 3 (age 70, gender).  If cardioversion is successful and A. fib stays away, we will plan to stop her digoxin.  In the past she had difficulty tolerating rate control medications due to low blood pressure, but seems to be doing reasonably well on the current dose of metoprolol. 2. Abnormal thyroid tests: Could well be caused by sick euthyroid syndrome rather than true thyrotoxicosis.  We will recheck her TSH and free  T4.  She has an endocrinology appointment already scheduled in June. 3. Prediabetes: Hemoglobin A1c was 6.1%, without medical therapy. 4. Mild obesity: Weight loss would help with correcting her metabolic abnormalities. 5. Recent COVID-19 pneumonia: Briefly required oxygen, seems to have recovered from it.  She will not need to be retested for coronavirus before her cardioversion.   Medication Adjustments/Labs and Tests Ordered: Current medicines are reviewed at length with the patient today.  Concerns regarding medicines are outlined above.  Orders Placed This Encounter  Procedures  . Basic metabolic panel  . CBC  . EKG 12-Lead   Meds ordered this encounter  Medications  . digoxin (LANOXIN)  0.125 MG tablet    Sig: Take 1 tablet (0.125 mg total) by mouth daily.    Dispense:  30 tablet    Refill:  0  . metoprolol tartrate (LOPRESSOR) 100 MG tablet    Sig: Take 1 tablet (100 mg total) by mouth 2 (two) times daily.    Dispense:  60 tablet    Refill:  2  . apixaban (ELIQUIS) 5 MG TABS tablet    Sig: TAKE 1 TABLET(5 MG) BY MOUTH TWICE DAILY    Dispense:  180 tablet    Refill:  1   STOP ASPIRIN   Signed, Tasha Klein, MD  04/30/2020 9:14 AM    [place]

## 2020-04-29 NOTE — Patient Instructions (Addendum)
Medication Instructions:  No changes *If you need a refill on your cardiac medications before your next appointment, please call your pharmacy*  Follow-Up: At [facility], you and your health needs are our priority.  As part of our continuing mission to provide you with exceptional heart care, we have created designated Provider Care Teams.  These Care Teams include your primary Cardiologist (physician) and Advanced Practice Providers (APPs -  Physician Assistants and Nurse Practitioners) who all work together to provide you with the care you need, when you need it.  We recommend signing up for the patient portal called "MyChart".  Sign up information is provided on this After Visit Summary.  MyChart is used to connect with patients for Virtual Visits (Telemedicine).  Patients are able to view lab/test results, encounter notes, upcoming appointments, etc.  Non-urgent messages can be sent to your provider as well.   To learn more about what you can do with MyChart, go to [email or web address].    Your next appointment:   1 month(s) after the cardioversion on 05/15/20  The format for your next appointment:   In Person  Provider:   You may see Sanda Klein, MD or one of the following Advanced Practice Providers on your designated Care Team:    Almyra Deforest, PA-C  Fabian Sharp, [place] or   Roby Lofts, [place]    Other Instructions  You are scheduled for a Cardioversion on 05/15/20 with Dr. Sallyanne Kuster.  Please arrive at the [facility] (Main Entrance A) at [facility]: [address] [place], [place] [address] at 10 am. (1 hour prior to procedure)  DIET: Nothing to eat or drink after midnight except a sip of water with medications (see medication instructions below)  Medication Instructions: Hold-nothing to hold  Continue your anticoagulant: Eliquis. If you miss a dose, please call [phone] for the cardioversion will need to be rescheduled.  You will need to continue your  anticoagulant after your procedure until you  are told by your provider that it is safe to stop   Labs:  Your provider would like for you to return on  05/13/20 to have the following labs drawn: CBC and BMET. You do not need an appointment for the lab. Once in our office lobby there is a podium where you can sign in and ring the doorbell to alert [place] that you are here. The lab is open from 8:00 am to 4:30 pm; closed for lunch from 12:45pm-1:45pm. You must have a responsible person to drive you home and stay in the waiting area during your procedure. Failure to do so could result in cancellation.  No COVID test needed within 90 days of having COVID (March 17th)  Bring your insurance cards.  You must have a responsible person to drive you home and stay in the waiting area during your procedure. Failure to do so could result in cancellation.  *Special Note: Every effort is made to have your procedure done on time. Occasionally there are emergencies that occur at the hospital that may cause delays. Please be patient if a delay does occur.

## 2020-04-30 ENCOUNTER — Telehealth: Payer: Self-pay | Admitting: Cardiovascular Disease

## 2020-04-30 NOTE — Telephone Encounter (Signed)
Tasha [name] has been made aware that she was tested on 3/18 and was covid positive.

## 2020-04-30 NOTE — Telephone Encounter (Signed)
Brandy with Endoscopy is calling regarding patient's procedure scheduled for 05/15/20. She states she was made aware that the patient was COVID positive in March, but she would like to know where she was tested. She states if it was an at-home test, then the patient will have to retest. Otherwise, the patient will not need to take another test. Please advise.  Phone #: [phone]

## 2020-05-01 DIAGNOSIS — I5030 Unspecified diastolic (congestive) heart failure: Secondary | ICD-10-CM | POA: Diagnosis not present

## 2020-05-01 DIAGNOSIS — I480 Paroxysmal atrial fibrillation: Secondary | ICD-10-CM | POA: Diagnosis not present

## 2020-05-01 DIAGNOSIS — A4189 Other specified sepsis: Secondary | ICD-10-CM | POA: Diagnosis not present

## 2020-05-01 DIAGNOSIS — J1282 Pneumonia due to coronavirus disease 2019: Secondary | ICD-10-CM | POA: Diagnosis not present

## 2020-05-01 DIAGNOSIS — G510 Bell's palsy: Secondary | ICD-10-CM | POA: Diagnosis not present

## 2020-05-01 DIAGNOSIS — U071 COVID-19: Secondary | ICD-10-CM | POA: Diagnosis not present

## 2020-05-02 DIAGNOSIS — G510 Bell's palsy: Secondary | ICD-10-CM | POA: Diagnosis not present

## 2020-05-02 DIAGNOSIS — I480 Paroxysmal atrial fibrillation: Secondary | ICD-10-CM | POA: Diagnosis not present

## 2020-05-02 DIAGNOSIS — J1282 Pneumonia due to coronavirus disease 2019: Secondary | ICD-10-CM | POA: Diagnosis not present

## 2020-05-02 DIAGNOSIS — U071 COVID-19: Secondary | ICD-10-CM | POA: Diagnosis not present

## 2020-05-02 DIAGNOSIS — I5030 Unspecified diastolic (congestive) heart failure: Secondary | ICD-10-CM | POA: Diagnosis not present

## 2020-05-02 DIAGNOSIS — A4189 Other specified sepsis: Secondary | ICD-10-CM | POA: Diagnosis not present

## 2020-05-08 DIAGNOSIS — U071 COVID-19: Secondary | ICD-10-CM | POA: Diagnosis not present

## 2020-05-08 DIAGNOSIS — G510 Bell's palsy: Secondary | ICD-10-CM | POA: Diagnosis not present

## 2020-05-08 DIAGNOSIS — I5030 Unspecified diastolic (congestive) heart failure: Secondary | ICD-10-CM | POA: Diagnosis not present

## 2020-05-08 DIAGNOSIS — A4189 Other specified sepsis: Secondary | ICD-10-CM | POA: Diagnosis not present

## 2020-05-08 DIAGNOSIS — I480 Paroxysmal atrial fibrillation: Secondary | ICD-10-CM | POA: Diagnosis not present

## 2020-05-08 DIAGNOSIS — J1282 Pneumonia due to coronavirus disease 2019: Secondary | ICD-10-CM | POA: Diagnosis not present

## 2020-05-09 ENCOUNTER — Telehealth: Payer: Self-pay | Admitting: Cardiovascular Disease

## 2020-05-09 DIAGNOSIS — J1282 Pneumonia due to coronavirus disease 2019: Secondary | ICD-10-CM | POA: Diagnosis not present

## 2020-05-09 DIAGNOSIS — U071 COVID-19: Secondary | ICD-10-CM | POA: Diagnosis not present

## 2020-05-09 DIAGNOSIS — A4189 Other specified sepsis: Secondary | ICD-10-CM | POA: Diagnosis not present

## 2020-05-09 DIAGNOSIS — G510 Bell's palsy: Secondary | ICD-10-CM | POA: Diagnosis not present

## 2020-05-09 DIAGNOSIS — I480 Paroxysmal atrial fibrillation: Secondary | ICD-10-CM | POA: Diagnosis not present

## 2020-05-09 DIAGNOSIS — I5030 Unspecified diastolic (congestive) heart failure: Secondary | ICD-10-CM | POA: Diagnosis not present

## 2020-05-09 MED ORDER — DIGOXIN 125 MCG PO TABS: 0.1250 mg | ORAL_TABLET | Freq: Every day | ORAL | 11 refills | Status: DC

## 2020-05-09 NOTE — Telephone Encounter (Signed)
Patient states she was prescribed this medication in the hospital. She would like to know if Dr. Sallyanne Kuster recommends that she continues taking it. If so, she would like a refill sent to the pharmacy listed below. She states she only has 2 tablets remaining.  *STAT* If patient is at the pharmacy, call can be transferred to refill team.   1. Which medications need to be refilled? (please list name of each medication and dose if known)  digoxin (LANOXIN) 0.125 MG tablet  2. Which pharmacy/location (including street and city if local pharmacy) is medication to be sent to? [facility] [place], [place]  3. Do they need a 30 day or 90 day supply? 30 day supply

## 2020-05-13 ENCOUNTER — Other Ambulatory Visit (HOSPITAL_COMMUNITY): Payer: Medicare Other

## 2020-05-13 DIAGNOSIS — M21372 Foot drop, left foot: Secondary | ICD-10-CM | POA: Diagnosis not present

## 2020-05-13 DIAGNOSIS — E538 Deficiency of other specified B group vitamins: Secondary | ICD-10-CM | POA: Diagnosis not present

## 2020-05-13 DIAGNOSIS — I480 Paroxysmal atrial fibrillation: Secondary | ICD-10-CM | POA: Diagnosis not present

## 2020-05-13 DIAGNOSIS — E871 Hypo-osmolality and hyponatremia: Secondary | ICD-10-CM | POA: Diagnosis not present

## 2020-05-13 DIAGNOSIS — D696 Thrombocytopenia, unspecified: Secondary | ICD-10-CM | POA: Diagnosis not present

## 2020-05-13 DIAGNOSIS — A4189 Other specified sepsis: Secondary | ICD-10-CM | POA: Diagnosis not present

## 2020-05-13 DIAGNOSIS — M19049 Primary osteoarthritis, unspecified hand: Secondary | ICD-10-CM | POA: Diagnosis not present

## 2020-05-13 DIAGNOSIS — G510 Bell's palsy: Secondary | ICD-10-CM | POA: Diagnosis not present

## 2020-05-13 DIAGNOSIS — M21371 Foot drop, right foot: Secondary | ICD-10-CM | POA: Diagnosis not present

## 2020-05-13 DIAGNOSIS — G600 Hereditary motor and sensory neuropathy: Secondary | ICD-10-CM | POA: Diagnosis not present

## 2020-05-13 DIAGNOSIS — U071 COVID-19: Secondary | ICD-10-CM | POA: Diagnosis not present

## 2020-05-13 DIAGNOSIS — E0590 Thyrotoxicosis, unspecified without thyrotoxic crisis or storm: Secondary | ICD-10-CM | POA: Diagnosis not present

## 2020-05-13 DIAGNOSIS — I5030 Unspecified diastolic (congestive) heart failure: Secondary | ICD-10-CM | POA: Diagnosis not present

## 2020-05-13 DIAGNOSIS — E876 Hypokalemia: Secondary | ICD-10-CM | POA: Diagnosis not present

## 2020-05-13 DIAGNOSIS — C9110 Chronic lymphocytic leukemia of B-cell type not having achieved remission: Secondary | ICD-10-CM | POA: Diagnosis not present

## 2020-05-13 DIAGNOSIS — Z01812 Encounter for preprocedural laboratory examination: Secondary | ICD-10-CM | POA: Diagnosis not present

## 2020-05-13 DIAGNOSIS — E559 Vitamin D deficiency, unspecified: Secondary | ICD-10-CM | POA: Diagnosis not present

## 2020-05-13 DIAGNOSIS — J1282 Pneumonia due to coronavirus disease 2019: Secondary | ICD-10-CM | POA: Diagnosis not present

## 2020-05-13 DIAGNOSIS — E669 Obesity, unspecified: Secondary | ICD-10-CM | POA: Diagnosis not present

## 2020-05-13 DIAGNOSIS — Z7901 Long term (current) use of anticoagulants: Secondary | ICD-10-CM | POA: Diagnosis not present

## 2020-05-13 DIAGNOSIS — G35 Multiple sclerosis: Secondary | ICD-10-CM | POA: Diagnosis not present

## 2020-05-13 DIAGNOSIS — Z6832 Body mass index (BMI) 32.0-32.9, adult: Secondary | ICD-10-CM | POA: Diagnosis not present

## 2020-05-14 ENCOUNTER — Other Ambulatory Visit: Payer: Self-pay | Admitting: *Deleted

## 2020-05-14 DIAGNOSIS — I5030 Unspecified diastolic (congestive) heart failure: Secondary | ICD-10-CM | POA: Diagnosis not present

## 2020-05-14 DIAGNOSIS — G510 Bell's palsy: Secondary | ICD-10-CM | POA: Diagnosis not present

## 2020-05-14 DIAGNOSIS — I480 Paroxysmal atrial fibrillation: Secondary | ICD-10-CM | POA: Diagnosis not present

## 2020-05-14 DIAGNOSIS — A4189 Other specified sepsis: Secondary | ICD-10-CM | POA: Diagnosis not present

## 2020-05-14 DIAGNOSIS — U071 COVID-19: Secondary | ICD-10-CM | POA: Diagnosis not present

## 2020-05-14 DIAGNOSIS — J1282 Pneumonia due to coronavirus disease 2019: Secondary | ICD-10-CM | POA: Diagnosis not present

## 2020-05-14 LAB — CBC
Hematocrit: 38.5 % (ref 34.0–46.6)
Hemoglobin: 12.2 g/dL (ref 11.1–15.9)
MCH: 26.9 pg (ref 26.6–33.0)
MCHC: 31.7 g/dL (ref 31.5–35.7)
MCV: 85 fL (ref 79–97)
Platelets: 95 10*3/uL — CL (ref 150–450)
RBC: 4.53 x10E6/uL (ref 3.77–5.28)
RDW: 15.9 % — ABNORMAL HIGH (ref 11.7–15.4)
WBC: 4.3 10*3/uL (ref 3.4–10.8)

## 2020-05-14 LAB — BASIC METABOLIC PANEL
BUN/Creatinine Ratio: 15 (ref 12–28)
BUN: 12 mg/dL (ref 8–27)
CO2: 22 mmol/L (ref 20–29)
Calcium: 8.9 mg/dL (ref 8.7–10.3)
Chloride: 106 mmol/L (ref 96–106)
Creatinine, Ser: 0.78 mg/dL (ref 0.57–1.00)
Glucose: 212 mg/dL — ABNORMAL HIGH (ref 65–99)
Potassium: 4.3 mmol/L (ref 3.5–5.2)
Sodium: 142 mmol/L (ref 134–144)
eGFR: 78 mL/min/{1.73_m2} (ref >59)

## 2020-05-15 ENCOUNTER — Encounter (HOSPITAL_COMMUNITY)
Admission: RE | Disposition: A | Discharge status: Home / Self Care | Payer: Self-pay | Source: Home / Self Care | Attending: Cardiovascular Disease

## 2020-05-15 ENCOUNTER — Encounter (HOSPITAL_COMMUNITY): Payer: Self-pay | Admitting: Cardiovascular Disease

## 2020-05-15 ENCOUNTER — Ambulatory Visit (HOSPITAL_COMMUNITY): Payer: Medicare Other | Admitting: Certified Registered Nurse Anesthetist

## 2020-05-15 ENCOUNTER — Ambulatory Visit (HOSPITAL_COMMUNITY)
Admission: RE | Admit: 2020-05-15 | Discharge: 2020-05-15 | Disposition: A | Discharge status: Home / Self Care | Payer: Medicare Other | Attending: Cardiovascular Disease | Admitting: Cardiovascular Disease

## 2020-05-15 DIAGNOSIS — R946 Abnormal results of thyroid function studies: Secondary | ICD-10-CM | POA: Diagnosis not present

## 2020-05-15 DIAGNOSIS — Z886 Allergy status to analgesic agent status: Secondary | ICD-10-CM | POA: Insufficient documentation

## 2020-05-15 DIAGNOSIS — Z79899 Other long term (current) drug therapy: Secondary | ICD-10-CM | POA: Insufficient documentation

## 2020-05-15 DIAGNOSIS — E876 Hypokalemia: Secondary | ICD-10-CM | POA: Diagnosis not present

## 2020-05-15 DIAGNOSIS — Z881 Allergy status to other antibiotic agents status: Secondary | ICD-10-CM | POA: Insufficient documentation

## 2020-05-15 DIAGNOSIS — I480 Paroxysmal atrial fibrillation: Secondary | ICD-10-CM | POA: Diagnosis not present

## 2020-05-15 DIAGNOSIS — I4819 Other persistent atrial fibrillation: Secondary | ICD-10-CM | POA: Insufficient documentation

## 2020-05-15 DIAGNOSIS — Z880 Allergy status to penicillin: Secondary | ICD-10-CM | POA: Diagnosis not present

## 2020-05-15 DIAGNOSIS — Z6831 Body mass index (BMI) 31.0-31.9, adult: Secondary | ICD-10-CM | POA: Diagnosis not present

## 2020-05-15 DIAGNOSIS — Z8616 Personal history of COVID-19: Secondary | ICD-10-CM | POA: Diagnosis not present

## 2020-05-15 DIAGNOSIS — R7303 Prediabetes: Secondary | ICD-10-CM | POA: Diagnosis not present

## 2020-05-15 DIAGNOSIS — D696 Thrombocytopenia, unspecified: Secondary | ICD-10-CM | POA: Diagnosis not present

## 2020-05-15 DIAGNOSIS — Z888 Allergy status to other drugs, medicaments and biological substances status: Secondary | ICD-10-CM | POA: Insufficient documentation

## 2020-05-15 DIAGNOSIS — E669 Obesity, unspecified: Secondary | ICD-10-CM | POA: Diagnosis not present

## 2020-05-15 DIAGNOSIS — E559 Vitamin D deficiency, unspecified: Secondary | ICD-10-CM | POA: Diagnosis not present

## 2020-05-15 HISTORY — PX: CARDIOVERSION: SHX1299

## 2020-05-15 LAB — T4, FREE: Free T4: 0.82 ng/dL (ref 0.61–1.12)

## 2020-05-15 LAB — TSH: TSH: 4.076 u[IU]/mL (ref 0.350–4.500)

## 2020-05-15 SURGERY — CARDIOVERSION
Anesthesia: General

## 2020-05-15 MED ORDER — SODIUM CHLORIDE 0.9 % IV SOLN: INTRAVENOUS | Status: DC | PRN

## 2020-05-15 MED ORDER — PROPOFOL 10 MG/ML IV BOLUS
INTRAVENOUS | Status: DC | PRN
  Administered 2020-05-15: 70 mg via INTRAVENOUS

## 2020-05-15 MED ORDER — LIDOCAINE 2% (20 MG/ML) 5 ML SYRINGE
INTRAMUSCULAR | Status: DC | PRN
  Administered 2020-05-15: 100 mg via INTRAVENOUS

## 2020-05-15 NOTE — Discharge Instructions (Signed)

## 2020-05-15 NOTE — Anesthesia Procedure Notes (Signed)
Procedure Name: General with mask airway Date/Time: 05/15/2020 11:08 AM Performed by: Rande Brunt, CRNA Pre-anesthesia Checklist: Patient identified, Emergency Drugs available, Suction available and Patient being monitored Patient Re-evaluated:Patient Re-evaluated prior to induction Oxygen Delivery Method: Ambu bag Preoxygenation: Pre-oxygenation with 100% oxygen Induction Type: IV induction Ventilation: Mask ventilation without difficulty Placement Confirmation: CO2 detector and positive ETCO2 Dental Injury: Teeth and Oropharynx as per pre-operative assessment

## 2020-05-15 NOTE — Anesthesia Postprocedure Evaluation (Signed)
Anesthesia Post Note  Patient: Tasha [name]  Procedure(s) Performed: CARDIOVERSION (N/A )     Patient location during evaluation: PACU Anesthesia Type: General Level of consciousness: awake and alert and oriented Pain management: pain level controlled Vital Signs Assessment: post-procedure vital signs reviewed and stable Respiratory status: spontaneous breathing, nonlabored ventilation and respiratory function stable Cardiovascular status: blood pressure returned to baseline Postop Assessment: no apparent nausea or vomiting Anesthetic complications: no   No complications documented.  Last Vitals:  Vitals:   05/15/20 1100  BP: 131/64  Pulse: 91  Resp: 16  Temp: 36.9 C  SpO2: 99%    Last Pain:  Vitals:   05/15/20 1100  TempSrc: Temporal  PainSc: 0-No pain                 Brennan Bailey

## 2020-05-15 NOTE — Op Note (Signed)
Procedure: Electrical Cardioversion Indications:  Atrial Fibrillation  Procedure Details:  Consent: Risks of procedure as well as the alternatives and risks of each were explained to the (patient/caregiver).  Consent for procedure obtained.  Time Out: Verified patient identification, verified procedure, site/side was marked, verified correct patient position, special equipment/implants available, medications/allergies/relevent history reviewed, required imaging and test results available.  Performed  Patient placed on cardiac monitor, pulse oximetry, supplemental oxygen as necessary.  Sedation given: Dr. Ermalene Postin, propofol IV Pacer pads placed anterior and posterior chest.  Cardioverted 3 time(s).  Cardioversion with synchronized biphasic 120J shock, 200J shock, 200J shock. Each time there were 2-3 beats of sinus rhythm, followed by PACs, atrial tachycardia, quickly deteriorating to atrial fibrillation.  Evaluation: Findings: Post procedure EKG shows: Atrial Fibrillation Complications: None Patient did tolerate procedure well.  Time Spent Directly with the Patient:  30 minutes   Tasha [name] 05/15/2020, 10:57 AM

## 2020-05-15 NOTE — Interval H&P Note (Signed)
History and Physical Interval Note:  05/15/2020 10:38 AM  Tasha [name]  has presented today for surgery, with the diagnosis of AFIB.  The various methods of treatment have been discussed with the patient and family. After consideration of risks, benefits and other options for treatment, the patient has consented to  Procedure(s): CARDIOVERSION (N/A) as a surgical intervention.  The patient's history has been reviewed, patient examined, no change in status, stable for surgery.  I have reviewed the patient's chart and labs.  Questions were answered to the patient's satisfaction.     Garet Hooton

## 2020-05-15 NOTE — Anesthesia Preprocedure Evaluation (Addendum)
Anesthesia Evaluation  Patient identified by MRN, date of birth, ID band Patient awake    Reviewed: Allergy & Precautions, NPO status , Patient's Chart, lab work & pertinent test results  History of Anesthesia Complications Negative for: history of anesthetic complications  Airway Mallampati: II  TM Distance: >3 FB Neck ROM: Full    Dental  (+) Missing,    Pulmonary neg pulmonary ROS,    Pulmonary exam normal        Cardiovascular Normal cardiovascular exam+ dysrhythmias Atrial Fibrillation   EF 76-73%, RV systolic function mildly reduced, RV mildly enlarged, mildly elevated PASP 33.7 mmHg, mild LAE/RAE   Neuro/Psych Charcot-Marie-Tooth disease negative psych ROS   GI/Hepatic negative GI ROS, Neg liver ROS,   Endo/Other  negative endocrine ROS  Renal/GU negative Renal ROS  negative genitourinary   Musculoskeletal negative musculoskeletal ROS (+)   Abdominal   Peds  Hematology negative hematology ROS (+)   Anesthesia Other Findings Day of surgery medications reviewed with patient.  Reproductive/Obstetrics negative OB ROS                           Anesthesia Physical Anesthesia Plan  ASA: III  Anesthesia Plan: General   Post-op Pain Management:    Induction: Intravenous  PONV Risk Score and Plan: Treatment may vary due to age or medical condition and Propofol infusion  Airway Management Planned: Mask  Additional Equipment: None  Intra-op Plan:   Post-operative Plan:   Informed Consent: I have reviewed the patients History and Physical, chart, labs and discussed the procedure including the risks, benefits and alternatives for the proposed anesthesia with the patient or authorized representative who has indicated his/her understanding and acceptance.       Plan Discussed with: CRNA  Anesthesia Plan Comments:         Anesthesia Quick Evaluation

## 2020-05-15 NOTE — Transfer of Care (Signed)
Immediate Anesthesia Transfer of Care Note  Patient: Tasha [name]  Procedure(s) Performed: CARDIOVERSION (N/A )  Patient Location: Endoscopy Unit  Anesthesia Type:General  Level of Consciousness: drowsy and patient cooperative  Airway & Oxygen Therapy: Patient Spontanous Breathing  Post-op Assessment: Report given to RN and Post -op Vital signs reviewed and stable  Post vital signs: Reviewed and stable  Last Vitals:  Vitals Value Taken Time  BP 104/63   Temp    Pulse 108   Resp 20   SpO2 91     Last Pain:  Vitals:   05/15/20 1100  TempSrc: Temporal  PainSc: 0-No pain         Complications: No complications documented.

## 2020-05-16 ENCOUNTER — Other Ambulatory Visit: Payer: Self-pay | Admitting: *Deleted

## 2020-05-16 ENCOUNTER — Encounter (HOSPITAL_COMMUNITY): Payer: Self-pay | Admitting: Cardiovascular Disease

## 2020-05-16 DIAGNOSIS — U071 COVID-19: Secondary | ICD-10-CM | POA: Diagnosis not present

## 2020-05-16 DIAGNOSIS — J1282 Pneumonia due to coronavirus disease 2019: Secondary | ICD-10-CM | POA: Diagnosis not present

## 2020-05-16 DIAGNOSIS — A4189 Other specified sepsis: Secondary | ICD-10-CM | POA: Diagnosis not present

## 2020-05-16 DIAGNOSIS — I480 Paroxysmal atrial fibrillation: Secondary | ICD-10-CM | POA: Diagnosis not present

## 2020-05-16 DIAGNOSIS — I5030 Unspecified diastolic (congestive) heart failure: Secondary | ICD-10-CM | POA: Diagnosis not present

## 2020-05-16 DIAGNOSIS — G510 Bell's palsy: Secondary | ICD-10-CM | POA: Diagnosis not present

## 2020-05-17 ENCOUNTER — Telehealth: Payer: Self-pay | Admitting: Cardiovascular Disease

## 2020-05-17 MED ORDER — FLECAINIDE ACETATE 50 MG PO TABS: 50.0000 mg | ORAL_TABLET | Freq: Two times a day (BID) | ORAL | 0 refills | Status: DC

## 2020-05-17 NOTE — Telephone Encounter (Signed)
New Message:     Pt wants tif she should take the Flecainide? She said she was not sure since she was on Vitamin B 12 and D, also still have shortness of breath from COVID. If she is not there, please leave a detailed message.

## 2020-05-17 NOTE — Progress Notes (Signed)
Flecainide 50 mg twice daily has been sent in per lab result and Dr. Sallyanne Kuster instructions.

## 2020-05-17 NOTE — Telephone Encounter (Signed)
Spoke with patient who expressed concern about her supplements vit D & b12 and taking flecainide. Explained these are on her current med list, thus MD is aware what she is taking. Explained that when meds are prescribed, warning for possible interactions will appear before Rx is sent to pharmacy. She will plan to start taking med tmr and follow up as planned.

## 2020-05-17 NOTE — Telephone Encounter (Signed)
Ricci Barker, RN  05/17/2020 8:20 AM EDT Back to Top     Flecainide 50 mg bid has been sent in, 30 day supply.    Sanda Klein, MD  05/17/2020 8:09 AM EDT      The diagnosis of "long QT" in her problem list is not accurate. QTc around 420 ms on last several ECGs. OK to prescribe Flecainide 50 mg twice daily   Ricci Barker, RN  05/16/2020 5:36 PM EDT      Patient made aware of results and verbalized understanding.   Ricci Barker, RN  05/16/2020 3:17 PM EDT      Call placed to the patient. Family member advised to call back in one hour.    Sanda Klein, MD  05/15/2020 2:45 PM EDT      Her cardioversion was unsuccessful today. After 2 or 3 beats of normal rhythm following the shock, atrial fibrillation would immediately recur. The repeat thyroid function tests are now completely normal. I will send a copy to Dr. Kelton Pillar. I would like Mrs. Kraszewski to start taking flecainide 50 mg twice daily. There is a small chance this could allow her to return to normal rhythm, then we will see her back at her scheduled appointment in May and if still in atrial fibrillation we will consider her for another cardioversion.

## 2020-05-19 IMAGING — CR DG ANKLE COMPLETE 3+V*L*
3 series · 3 of 3 positions shown · non-contrast
Comparison: Right ankle radiograph dated 04/05/2019.

CLINICAL DATA: 78-year-old female with fall and left ankle pain.

EXAM:
LEFT ANKLE COMPLETE - 3+ VIEW

[x ankle ap left]
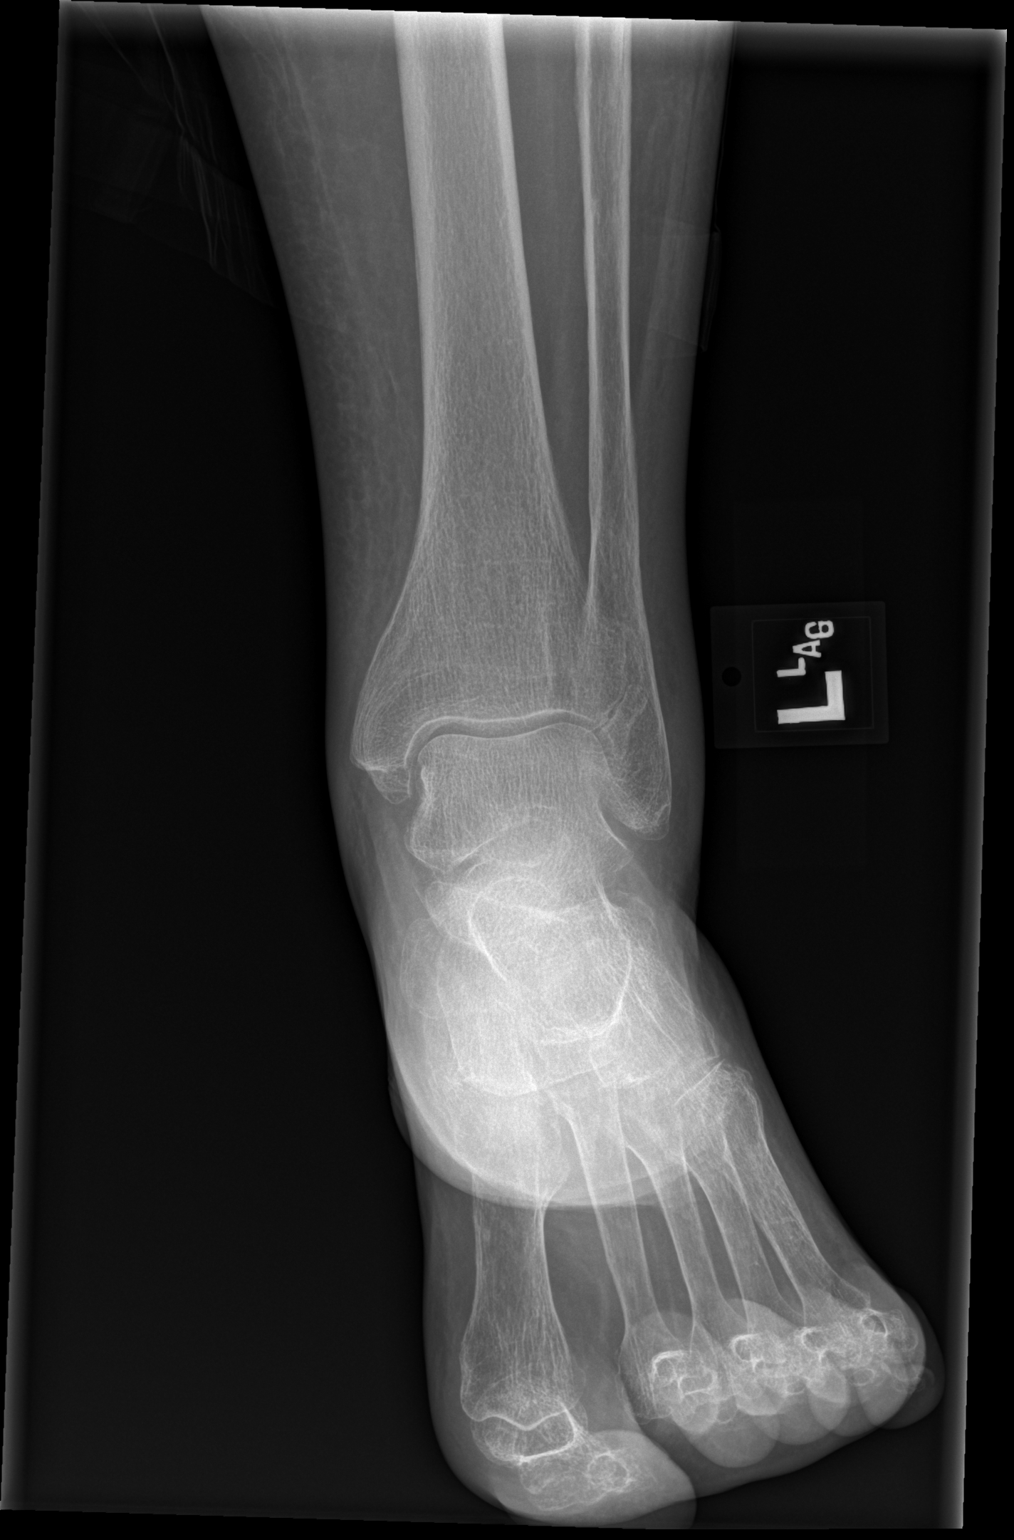

[x ankle obl left]
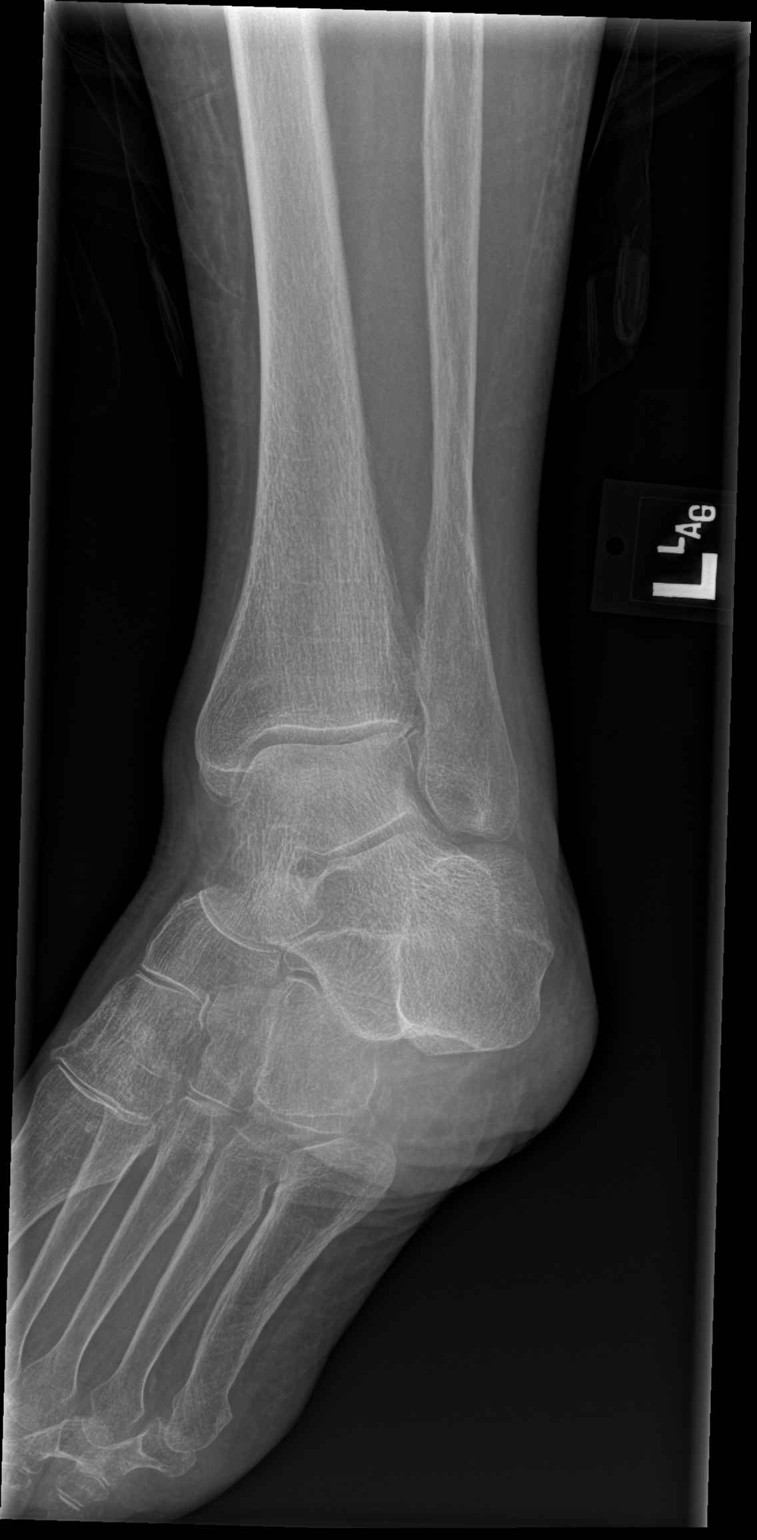

[x ankle lat left]
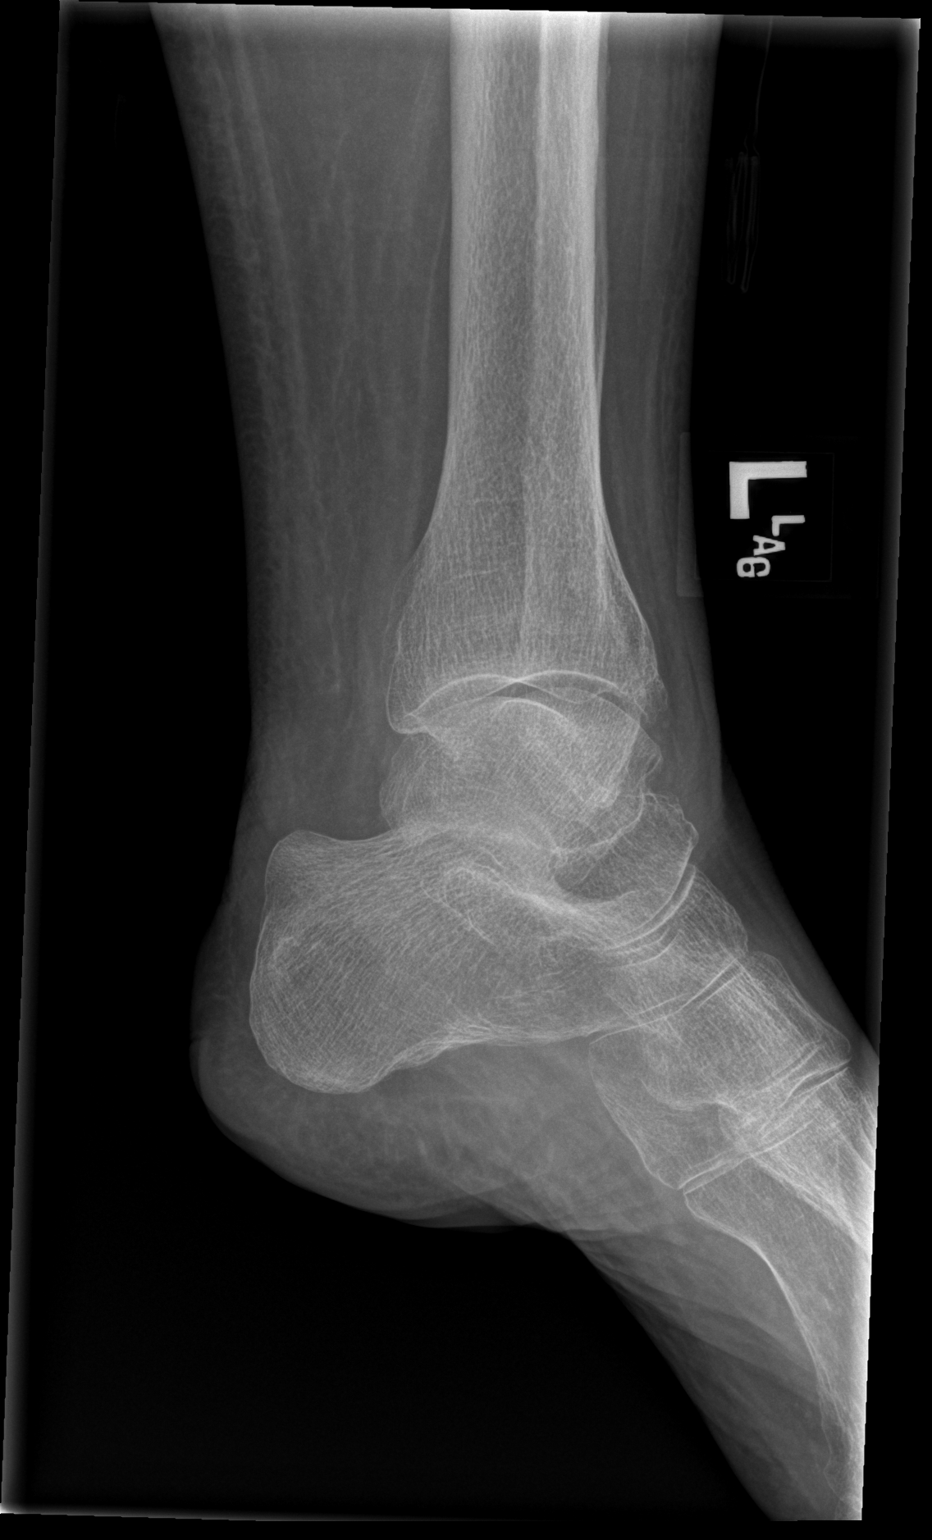

[3 of 3 positions shown; findings below may reference images not displayed]

FINDINGS: There is no acute fracture or dislocation. The bones are osteopenic.
The ankle mortise is intact. The soft tissues are unremarkable.
IMPRESSION: Negative.

## 2020-05-20 DIAGNOSIS — E0590 Thyrotoxicosis, unspecified without thyrotoxic crisis or storm: Secondary | ICD-10-CM | POA: Diagnosis not present

## 2020-05-20 DIAGNOSIS — I482 Chronic atrial fibrillation, unspecified: Secondary | ICD-10-CM | POA: Diagnosis not present

## 2020-05-20 DIAGNOSIS — C9110 Chronic lymphocytic leukemia of B-cell type not having achieved remission: Secondary | ICD-10-CM | POA: Diagnosis not present

## 2020-05-20 DIAGNOSIS — Z8616 Personal history of COVID-19: Secondary | ICD-10-CM | POA: Diagnosis not present

## 2020-05-20 DIAGNOSIS — G609 Hereditary and idiopathic neuropathy, unspecified: Secondary | ICD-10-CM | POA: Diagnosis not present

## 2020-05-21 ENCOUNTER — Other Ambulatory Visit: Payer: Self-pay

## 2020-05-21 ENCOUNTER — Inpatient Hospital Stay: Payer: Medicare Other | Attending: Oncology

## 2020-05-21 DIAGNOSIS — Z809 Family history of malignant neoplasm, unspecified: Secondary | ICD-10-CM | POA: Insufficient documentation

## 2020-05-21 DIAGNOSIS — Z79899 Other long term (current) drug therapy: Secondary | ICD-10-CM | POA: Diagnosis not present

## 2020-05-21 DIAGNOSIS — C9110 Chronic lymphocytic leukemia of B-cell type not having achieved remission: Secondary | ICD-10-CM | POA: Diagnosis not present

## 2020-05-21 LAB — COMPREHENSIVE METABOLIC PANEL WITH GFR
ALT: 10 U/L (ref 0–44)
AST: 13 U/L — ABNORMAL LOW (ref 15–41)
Albumin: 3.7 g/dL (ref 3.5–5.0)
Alkaline Phosphatase: 72 U/L (ref 38–126)
Anion gap: 7 (ref 5–15)
BUN: 12 mg/dL (ref 8–23)
CO2: 26 mmol/L (ref 22–32)
Calcium: 8.9 mg/dL (ref 8.9–10.3)
Chloride: 108 mmol/L (ref 98–111)
Creatinine, Ser: 0.73 mg/dL (ref 0.44–1.00)
GFR, Estimated: >60 mL/min
Glucose, Bld: 142 mg/dL — ABNORMAL HIGH (ref 70–99)
Potassium: 4.3 mmol/L (ref 3.5–5.1)
Sodium: 141 mmol/L (ref 135–145)
Total Bilirubin: 0.9 mg/dL (ref 0.3–1.2)
Total Protein: 5.7 g/dL — ABNORMAL LOW (ref 6.5–8.1)

## 2020-05-21 LAB — CBC WITH DIFFERENTIAL/PLATELET
Abs Immature Granulocytes: 0.02 10*3/uL (ref 0.00–0.07)
Basophils Absolute: 0 10*3/uL (ref 0.0–0.1)
Basophils Relative: 1 %
Eosinophils Absolute: 0.2 10*3/uL (ref 0.0–0.5)
Eosinophils Relative: 5 %
HCT: 39.1 % (ref 36.0–46.0)
Hemoglobin: 12.3 g/dL (ref 12.0–15.0)
Immature Granulocytes: 0 %
Lymphocytes Relative: 39 %
Lymphs Abs: 1.9 10*3/uL (ref 0.7–4.0)
MCH: 27.6 pg (ref 26.0–34.0)
MCHC: 31.5 g/dL (ref 30.0–36.0)
MCV: 87.7 fL (ref 80.0–100.0)
Monocytes Absolute: 0.6 10*3/uL (ref 0.1–1.0)
Monocytes Relative: 13 %
Neutro Abs: 2 10*3/uL (ref 1.7–7.7)
Neutrophils Relative %: 42 %
Platelets: 107 10*3/uL — ABNORMAL LOW (ref 150–400)
RBC: 4.46 MIL/uL (ref 3.87–5.11)
RDW: 17.3 % — ABNORMAL HIGH (ref 11.5–15.5)
WBC: 4.8 10*3/uL (ref 4.0–10.5)
nRBC: 0 % (ref 0.0–0.2)

## 2020-05-21 LAB — LACTATE DEHYDROGENASE: LDH: 267 U/L — ABNORMAL HIGH (ref 98–192)

## 2020-05-21 LAB — SAVE SMEAR(SSMR), FOR PROVIDER SLIDE REVIEW

## 2020-05-22 DIAGNOSIS — A4189 Other specified sepsis: Secondary | ICD-10-CM | POA: Diagnosis not present

## 2020-05-22 DIAGNOSIS — J1282 Pneumonia due to coronavirus disease 2019: Secondary | ICD-10-CM | POA: Diagnosis not present

## 2020-05-22 DIAGNOSIS — I5030 Unspecified diastolic (congestive) heart failure: Secondary | ICD-10-CM | POA: Diagnosis not present

## 2020-05-22 DIAGNOSIS — G510 Bell's palsy: Secondary | ICD-10-CM | POA: Diagnosis not present

## 2020-05-22 DIAGNOSIS — I480 Paroxysmal atrial fibrillation: Secondary | ICD-10-CM | POA: Diagnosis not present

## 2020-05-22 DIAGNOSIS — U071 COVID-19: Secondary | ICD-10-CM | POA: Diagnosis not present

## 2020-05-22 LAB — BETA 2 MICROGLOBULIN, SERUM: Beta-2 Microglobulin: 4.1 mg/L — ABNORMAL HIGH (ref 0.6–2.4)

## 2020-05-22 LAB — THYROID PANEL WITH TSH
Free Thyroxine Index: 1.4 (ref 1.2–4.9)
T3 Uptake Ratio: 24 % (ref 24–39)
T4, Total: 5.7 ug/dL (ref 4.5–12.0)
TSH: 2.3 u[IU]/mL (ref 0.450–4.500)

## 2020-05-22 LAB — IGG, IGA, IGM
IgA: 33 mg/dL — ABNORMAL LOW (ref 64–422)
IgG (Immunoglobin G), Serum: 275 mg/dL — ABNORMAL LOW (ref 586–1602)
IgM (Immunoglobulin M), Srm: 7 mg/dL — ABNORMAL LOW (ref 26–217)

## 2020-05-22 NOTE — Progress Notes (Signed)
Tasha [name]  [phone] [phone]     ID: Tasha [name] DOB: Jun 05, 1940  MR#: [id number]  RWE#:[id number]  Patient Care Team: Tasha Orn, MD as PCP - General (Internal Medicine) Tasha Klein, MD as PCP - Cardiology (Cardiology) Tasha Klein, MD as Consulting Physician (Cardiology) Tasha [name], Tasha Dad, MD as Consulting Physician (Oncology) Tasha Sous, MD as Referring Physician (Internal Medicine) OTHER MD:  CHIEF COMPLAINT: Chronic Lymphoid Leukemia  CURRENT TREATMENT: IVIG, evusheld   INTERVAL HISTORY: Tasha [name] returns today for follow up of her chronic lymphoid leukemia. She continues under observation.   She tells me she has had some symptoms that she cannot understand.  She has discussed this with her primary care physician.  These include some swelling in the lower legs.  She complains of drenching sweats.  She is very fatigued.  She has not had fevers.  She is not aware of any adenopathy.  A detailed review of systems today was otherwise stable.   REVIEW OF SYSTEMS: [name] had COVID March 2022.  She also developed A. fib and was cardioverted 05/15/2020.  She has had no pruritus, no rash, and no weight loss.  In fact she has gained a little bit of weight, although that might be fluid weight.  There have been no unusual headaches visual changes cough phlegm production or pleurisy.  Detailed review of systems was otherwise stable   COVID 19 VACCINATION STATUS: Had COVID March 2022.   HISTORY OF CURRENT ILLNESS: Tasha [name] has been following up with Dr. Sabas [name] at [facility]. From Tasha [name] 03/23/2017 note:  Tasha [name] is a 80 y.o. [place] woman with chronic lymphocytic leukemia diagnosed in 2001, Rai stage 0. She has been followed at [facility] by Dr. Sabas [name] at [facility], who is retiring (the end of an era).  Dr. Laurance Flatten was seeing Tasha [name] on a once a year basis.  She has not required treatment to date.  Most recent labs  obtained at [facility] 03/22/2017, showed a hemoglobin of 13.8, MCV 88, white cell count 6.1, platelets 101,000, unchanged since at least August 2007); with IgA 51, IgG 421, and IgM 11.  (The IgG has dropped from 642 August 2010 to 421 March 2019).  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  Beta-2 microglobulin obtained 11/30/2000 was not elevated at 1.9.  Sed rate on the same date was 4  The patient's subsequent history is as detailed below.   PAST MEDICAL HISTORY: Past Medical History:  Diagnosis Date  . Atrial fibrillation ([place])    questionable, echo 03/23/11 - EF >55%, on warfarin  . Bell's palsy   . 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 Charcot-Marie disease   . 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 Gait disturbance   . Gallstones   . 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 IFG (impaired fasting glucose)    e  . Personal history of other diseases of circulatory system   . Tick bite   . Unspecified hereditary and idiopathic peripheral neuropathy   . Vitamin B12 deficiency   . Vitamin D deficiency   Charcoid-Marie's tooth Disease   PAST SURGICAL HISTORY: Past Surgical History:  Procedure Laterality Date  . ABDOMINAL HYSTERECTOMY  1984  fibroids  . BREAST SURGERY  1960s   benign breast tumors  . CARDIOVERSION N/A 05/15/2020   Procedure: CARDIOVERSION;  Surgeon: Tasha Klein, MD;  Location: [facility] ENDOSCOPY;  Service: Cardiovascular;  Laterality: N/A;  . CATARACT EXTRACTION W/ INTRAOCULAR LENS  IMPLANT, BILATERAL    . LAPAROSCOPIC BILATERAL SALPINGO OOPHERECTOMY  2003   ovarian cyst  Tonsillectomy as a child.    FAMILY HISTORY Family History  Problem Relation Age of Onset  .  Hypertension Mother   . Coronary artery disease Father        died at 56  . Hypertension Father   . Diabetes Father   . Coronary artery disease Brother        died at 73  . Diabetes Brother   . Hypertension Brother   . Cancer Daughter   The patient's mother died at age 22 due to stroke and neurological problems similar to the patient's. The patient's father died at age 57 due to heart problems and diabetes. The patient had 1 brother who died with heart problems. The patient has 2 sisters, one of whom has diabetes. There was a maternal grandfather with prostate cancer.  The patient denies any other cancers such as lymphoma or myeloma in the family.    GYNECOLOGIC HISTORY:  No LMP recorded. Patient has had a hysterectomy. Menarche: 80 years old Age at first live birth: 80 years old She is GXP3.  LMP: That is post hysterectomy in 1984 fibroids. She used HRT for less than 2 years with no complications.    SOCIAL HISTORY: (Updated August 2021) Tasha [name] retired in 2001 due to her CLL. She worked at [employer] in [employer]. She and her husband, Tasha [name] have been married for 36 years as of August 2020. Tasha [name] used to work for [employer], and also has been a Psychologist, occupational at [employer]. The patient's oldest daughter, Tasha [name], worked at [employer] in the billing department but her job was terminated 2020 and she is now working for [employer]. The patient's second daughter is Tasha [name], who is a principal at [employer]. The patient's son, Tasha [name], is an estimator for home remodeling... At home, is the patient, her husband, and her grandson, 27 y/o Tasha [name] ([name]'s son), who is doing very well academically and has a strong interest in math. Tasha [name] mother is "not in his life". The patient attends [employer] [facility] [employer] ([place]).      HEALTH MAINTENANCE: Social History   Tobacco Use  . Smoking status: Never Smoker  . Smokeless tobacco: Never Used  Substance Use Topics  .  Alcohol use: No    Alcohol/week: 0.0 standard drinks  . Drug use: No     Colonoscopy: refuses   PAP: s/p hysterectomy  Bone density: 2018 was normal  Mammography: "no more" after 2017 per her PCP   Allergies  Allergen Reactions  . Clindamycin Itching and Rash  . Celebrex [Celecoxib] Other (See Comments)    Hypertension, possible TIA  . Statins Other (See Comments)    Muscle aches  . Tape     Patient CAN tolerate Coban Wrap only (NO TAPE!!)  . Tricor [Fenofibrate] Other (See Comments)    Elevated liver enzymes  . Amoxicillin Rash  . Clarithromycin Rash  . Latex Rash  . Methimazole Rash    Current Outpatient Medications  Medication Sig Dispense Refill  . acetaminophen (TYLENOL) 500 MG tablet Take 500-1,000 mg by mouth 2 (two) times daily as needed (pain.).    Marland Kitchen apixaban (ELIQUIS)  5 MG TABS tablet TAKE 1 TABLET(5 MG) BY MOUTH TWICE DAILY (Patient taking differently: Take 5 mg by mouth 2 (two) times daily. (0800 & 2000)) 180 tablet 1  . cholecalciferol (VITAMIN D) 25 MCG (1000 UNIT) tablet Take 1,000 Units by mouth daily with lunch.    . digoxin (LANOXIN) 0.125 MG tablet Take 1 tablet (0.125 mg total) by mouth daily. 30 tablet 11  . flecainide (TAMBOCOR) 50 MG tablet Take 1 tablet (50 mg total) by mouth 2 (two) times daily. 60 tablet 0  . metoprolol tartrate (LOPRESSOR) 100 MG tablet Take 1 tablet (100 mg total) by mouth 2 (two) times daily. (Patient taking differently: Take 100 mg by mouth 2 (two) times daily. (0800 & 2000)) 60 tablet 2  . Polyethyl Glycol-Propyl Glycol (SYSTANE) 0.4-0.3 % SOLN Place 1-2 drops into both eyes in the morning and at bedtime.    . vitamin B-12 (CYANOCOBALAMIN) 1000 MCG tablet Take 1,000 mcg by mouth daily with lunch.     No current facility-administered medications for this visit.    OBJECTIVE: White woman using a Rollator Vitals:   05/23/20 1411  BP: 125/67  Pulse: 94  Resp: 19  Temp: 98.3 F (36.8 C)  SpO2: 98%     Body mass index is  33.52 kg/m.   Wt Readings from Last 3 Encounters:  05/23/20 207 lb 11.2 oz (94.2 kg)  05/15/20 197 lb (89.4 kg)  04/29/20 198 lb (89.8 kg)  ECOG FS:1 - Symptomatic but completely ambulatory  Sclerae unicteric, EOMs intact Wearing a mask No cervical or supraclavicular adenopathy no axillary or inguinal adenopathy  lungs no rales or rhonchi Heart regular rate and rhythm Abd soft, nontender, positive bowel sounds MSK no focal spinal tenderness, no upper extremity lymphedema Neuro: nonfocal, well oriented, appropriate affect Breasts: Deferred   LAB RESULTS:  CMP     Component Value Date/Time   NA 141 05/21/2020 1251   NA 142 05/13/2020 0849   K 4.3 05/21/2020 1251   CL 108 05/21/2020 1251   CO2 26 05/21/2020 1251   GLUCOSE 142 (H) 05/21/2020 1251   BUN 12 05/21/2020 1251   BUN 12 05/13/2020 0849   CREATININE 0.73 05/21/2020 1251   CREATININE 0.74 01/31/2018 1358   CALCIUM 8.9 05/21/2020 1251   PROT 5.7 (L) 05/21/2020 1251   ALBUMIN 3.7 05/21/2020 1251   AST 13 (L) 05/21/2020 1251   AST 11 (L) 01/31/2018 1358   ALT 10 05/21/2020 1251   ALT 13 01/31/2018 1358   ALKPHOS 72 05/21/2020 1251   BILITOT 0.9 05/21/2020 1251   BILITOT 0.6 01/31/2018 1358   GFRNONAA >60 05/21/2020 1251   GFRNONAA >60 01/31/2018 1358   GFRAA >60 09/12/2019 0853   GFRAA >60 01/31/2018 1358    No results found for: TOTALPROTELP, ALBUMINELP, A1GS, A2GS, BETS, BETA2SER, GAMS, MSPIKE, SPEI  No results found for: KPAFRELGTCHN, LAMBDASER, KAPLAMBRATIO  Lab Results  Component Value Date   WBC 4.8 05/21/2020   NEUTROABS 2.0 05/21/2020   HGB 12.3 05/21/2020   HCT 39.1 05/21/2020   MCV 87.7 05/21/2020   PLT 107 (L) 05/21/2020   No results found for: LABCA2  No components found for: UUVOZD664  No results for input(s): INR in the last 168 hours.  No results found for: LABCA2  No results found for: QIH474  No results found for: QVZ563  No results found for: OVF643  No results found for:  CA2729  No components found for: HGQUANT  No results found for: CEA1 /  No results found for: CEA1   No results found for: AFPTUMOR  No results found for: CHROMOGRNA  No results found for: HGBA, HGBA2QUANT, HGBFQUANT, HGBSQUAN (Hemoglobinopathy evaluation)   Lab Results  Component Value Date   LDH 267 (H) 05/21/2020    No results found for: IRON, TIBC, IRONPCTSAT (Iron and TIBC)  No results found for: FERRITIN  Urinalysis    Component Value Date/Time   COLORURINE AMBER (A) 04/05/2020 0606   APPEARANCEUR HAZY (A) 04/05/2020 0606   LABSPEC 1.018 04/05/2020 0606   PHURINE 5.0 04/05/2020 0606   GLUCOSEU NEGATIVE 04/05/2020 0606   HGBUR SMALL (A) 04/05/2020 0606   BILIRUBINUR NEGATIVE 04/05/2020 0606   KETONESUR 20 (A) 04/05/2020 0606   PROTEINUR 100 (A) 04/05/2020 0606   UROBILINOGEN 0.2 05/13/2009 0821   NITRITE NEGATIVE 04/05/2020 0606   LEUKOCYTESUR NEGATIVE 04/05/2020 0606    STUDIES: No results found.   ELIGIBLE FOR AVAILABLE RESEARCH PROTOCOL: no  ASSESSMENT: 80 y.o. [place], [place] woman with chronic lymphoid leukemia initially diagnosed 2001, Rai stage 0; CD 20 and CD5 positive, CD23 and CD10 negative, lambda restricted; FISH studies February 2015 showing trisomy 12 and deletion 8 (heterozygous), but no t(11,14) and unremarkable TP53 and ATM;  not requiring treatment to date  (1) total IgG less than 450 as of March 2019  (2) moderate thrombocytopenia, stable  (3) severe hypogammaglobulinemia.  (a) IVIG supplementation started May 2022  (b) evusheld started May 2022   PLAN: Raylie is now 21 years out from initial diagnosis of chronic lymphoid leukemia.  She has not required treatment until now.  I am not sure her leukemia is progressing.  She has no anemia and her white cell count and lymphocyte count are in the normal range.  The platelet count is mildly decreased but entirely stable with prior determinations.  Nevertheless she has "B" symptoms including  sweats and extreme fatigue.  I think what we are witnessing is the results of her immunocompromise rather than progression of the disease itself.  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  She understands that although she has had vaccines against SARS COVID 2 she does not have immunity even after having had the disease.  She should function as if she had not been vaccinated.  I think we need to booster B-cell function as best we can.  This means passive immunity and she will receive IVIG 3 times a year, beginning next week, and she also will receive evusheld as soon as we can arrange that for her.  She understands once she receives the first dose of evusheld that would be equivalent to her being vaccinated effectively.  She is aware of the possible toxicities side effects and complications of these agents.  I am hopeful that by providing her with passive  immunity her "B" symptoms will improve.  I am not sure her lower extremity grade 1 lymphedema will improve but I did suggest she try compression stockings for that  I will see her again in June and reassess the response to these treatments.  Total encounter time 35 minutes.Sarajane Jews C. Beryl Balz, MD  05/23/20 5:59 PM Medical Oncology and Hematology [facility] [place], [place] [address] Tel. [phone]    Fax. [phone]   I, Wilburn Mylar, am acting as scribe for Dr. Virgie [name]. Dajon Lazar.  I, Lurline Del MD, have reviewed the above documentation for accuracy and completeness, and I agree with the above.   *Total Encounter Time as defined by  the Centers for Medicare and Medicaid Services includes, in addition to the face-to-face time of a patient visit (documented in the note above) non-face-to-face time: obtaining and reviewing outside history, ordering and reviewing medications, tests or procedures, care coordination (communications with other health care professionals or caregivers) and documentation in  the medical record.

## 2020-05-23 ENCOUNTER — Inpatient Hospital Stay (HOSPITAL_BASED_OUTPATIENT_CLINIC_OR_DEPARTMENT_OTHER): Payer: Medicare Other | Admitting: Oncology

## 2020-05-23 ENCOUNTER — Other Ambulatory Visit: Payer: Self-pay

## 2020-05-23 VITALS — BP 125/67 | HR 94 | Temp 98.3°F | Resp 19 | Wt 207.7 lb

## 2020-05-23 DIAGNOSIS — D801 Nonfamilial hypogammaglobulinemia: Secondary | ICD-10-CM | POA: Diagnosis not present

## 2020-05-23 DIAGNOSIS — Z79899 Other long term (current) drug therapy: Secondary | ICD-10-CM | POA: Diagnosis not present

## 2020-05-23 DIAGNOSIS — D849 Immunodeficiency, unspecified: Secondary | ICD-10-CM | POA: Diagnosis not present

## 2020-05-23 DIAGNOSIS — Z809 Family history of malignant neoplasm, unspecified: Secondary | ICD-10-CM | POA: Diagnosis not present

## 2020-05-23 DIAGNOSIS — C9110 Chronic lymphocytic leukemia of B-cell type not having achieved remission: Secondary | ICD-10-CM | POA: Diagnosis not present

## 2020-05-27 ENCOUNTER — Other Ambulatory Visit: Payer: Self-pay | Admitting: Adult Health

## 2020-05-27 DIAGNOSIS — C9110 Chronic lymphocytic leukemia of B-cell type not having achieved remission: Secondary | ICD-10-CM

## 2020-05-27 NOTE — Progress Notes (Signed)
I connected by phone with Tasha [name] on 05/27/2020, 10:35 AM to discuss the potential use of a new treatment, tixagevimab/cilgavimab, for pre-exposure prophylaxis for prevention of coronavirus disease 2019 (COVID-19) caused by the SARS-CoV-2 virus.  This patient is a 80 y.o. female that meets the FDA criteria for Emergency Use Authorization of tixagevimab/cilgavimab for pre-exposure prophylaxis of COVID-19 disease. Pt meets following criteria:  Age >12 yr and weight > 40kg  Not currently infected with SARS-CoV-2 and has no known recent exposure to an individual infected with SARS-CoV-2 AND o Who has moderate to severe immune compromise due to a medical condition or receipt of immunosuppressive medications or treatments and may not mount an adequate immune response to COVID-19 vaccination or  o Vaccination with any available COVID-19 vaccine, according to the approved or authorized schedule, is not recommended due to a history of severe adverse reaction (e.g., severe allergic reaction) to a COVID-19 vaccine(s) and/or COVID-19 vaccine component(s).  o Patient meets the following definition of mod-severe immune compromised status: 1. Received B-cell depleting therapies (e.g. rituximab, obinutuzumab, ocrelizumab, alemtuzumab) within last 6 months & age > or = 82 and 6. Other actively treated hematologic malignancies or severe congenital immunodeficiency syndromes  I have spoken and communicated the following to the patient or parent/caregiver regarding COVID monoclonal antibody treatment:  1. FDA has authorized the emergency use of tixagevimab/cilgavimab for the pre-exposure prophylaxis of COVID-19 in patients with moderate-severe immunocompromised status, who meet above EUA criteria.  2. The significant known and potential risks and benefits of COVID monoclonal antibody, and the extent to which such potential risks and benefits are unknown.  3. Information on available alternative treatments and the  risks and benefits of those alternatives, including clinical trials.  4. The patient or parent/caregiver has the option to accept or refuse COVID monoclonal antibody treatment.  After reviewing this information with the patient, agree to receive tixagevimab/cilgavimab.  She will receive this when she comes in for her next infusion--I sent a high priority schedule message to have it listed in her appointment notes, for communication purposes.  Scot Dock, NP, 05/27/2020, 10:35 AM

## 2020-05-28 DIAGNOSIS — A4189 Other specified sepsis: Secondary | ICD-10-CM | POA: Diagnosis not present

## 2020-05-28 DIAGNOSIS — I480 Paroxysmal atrial fibrillation: Secondary | ICD-10-CM | POA: Diagnosis not present

## 2020-05-28 DIAGNOSIS — G510 Bell's palsy: Secondary | ICD-10-CM | POA: Diagnosis not present

## 2020-05-28 DIAGNOSIS — U071 COVID-19: Secondary | ICD-10-CM | POA: Diagnosis not present

## 2020-05-28 DIAGNOSIS — I5030 Unspecified diastolic (congestive) heart failure: Secondary | ICD-10-CM | POA: Diagnosis not present

## 2020-05-28 DIAGNOSIS — J1282 Pneumonia due to coronavirus disease 2019: Secondary | ICD-10-CM | POA: Diagnosis not present

## 2020-05-29 DIAGNOSIS — G510 Bell's palsy: Secondary | ICD-10-CM | POA: Diagnosis not present

## 2020-05-29 DIAGNOSIS — U071 COVID-19: Secondary | ICD-10-CM | POA: Diagnosis not present

## 2020-05-29 DIAGNOSIS — J1282 Pneumonia due to coronavirus disease 2019: Secondary | ICD-10-CM | POA: Diagnosis not present

## 2020-05-29 DIAGNOSIS — I480 Paroxysmal atrial fibrillation: Secondary | ICD-10-CM | POA: Diagnosis not present

## 2020-05-29 DIAGNOSIS — I5030 Unspecified diastolic (congestive) heart failure: Secondary | ICD-10-CM | POA: Diagnosis not present

## 2020-05-29 DIAGNOSIS — A4189 Other specified sepsis: Secondary | ICD-10-CM | POA: Diagnosis not present

## 2020-05-30 ENCOUNTER — Other Ambulatory Visit: Payer: Self-pay | Admitting: Oncology

## 2020-05-31 ENCOUNTER — Telehealth: Payer: Self-pay | Admitting: Oncology

## 2020-05-31 NOTE — Telephone Encounter (Signed)
Scheduled appts per 5/11 sch msg. Pt aware.  

## 2020-06-03 ENCOUNTER — Inpatient Hospital Stay: Payer: Medicare Other

## 2020-06-03 VITALS — BP 101/55 | HR 69 | Temp 97.7°F | Resp 16

## 2020-06-03 DIAGNOSIS — Z79899 Other long term (current) drug therapy: Secondary | ICD-10-CM | POA: Diagnosis not present

## 2020-06-03 DIAGNOSIS — D801 Nonfamilial hypogammaglobulinemia: Secondary | ICD-10-CM

## 2020-06-03 DIAGNOSIS — D849 Immunodeficiency, unspecified: Secondary | ICD-10-CM

## 2020-06-03 DIAGNOSIS — Z809 Family history of malignant neoplasm, unspecified: Secondary | ICD-10-CM | POA: Diagnosis not present

## 2020-06-03 DIAGNOSIS — C9110 Chronic lymphocytic leukemia of B-cell type not having achieved remission: Secondary | ICD-10-CM

## 2020-06-03 MED ORDER — ACETAMINOPHEN 325 MG PO TABS
650.0000 mg | ORAL_TABLET | Freq: Once | ORAL | Status: AC
Start: 2020-06-03 — End: 2020-06-03
  Administered 2020-06-03: 650 mg via ORAL

## 2020-06-03 MED ORDER — ACETAMINOPHEN 325 MG PO TABS
ORAL_TABLET | ORAL | Status: AC
  Filled 2020-06-03: qty 2

## 2020-06-03 MED ORDER — IMMUNE GLOBULIN (HUMAN) 10 GM/100ML IV SOLN
1.0000 g/kg | Freq: Once | INTRAVENOUS | Status: AC
  Administered 2020-06-03: 95 g via INTRAVENOUS
  Filled 2020-06-03: qty 800

## 2020-06-03 MED ORDER — DEXTROSE 5 % IV SOLN
INTRAVENOUS | Status: DC
  Filled 2020-06-03 (×2): qty 250

## 2020-06-03 MED ORDER — DIPHENHYDRAMINE HCL 25 MG PO TABS
25.0000 mg | ORAL_TABLET | Freq: Once | ORAL | Status: AC
  Administered 2020-06-03: 25 mg via ORAL
  Filled 2020-06-03: qty 1

## 2020-06-03 MED ORDER — DIPHENHYDRAMINE HCL 25 MG PO CAPS
ORAL_CAPSULE | ORAL | Status: AC
  Filled 2020-06-03: qty 1

## 2020-06-03 NOTE — Patient Instructions (Signed)
Immune Globulin Injection What is this medicine? IMMUNE GLOBULIN (im MUNE GLOB yoo lin) helps to prevent or reduce the severity of certain infections in patients who are at risk. This medicine is collected from the pooled blood of many donors. It is used to treat immune system problems, thrombocytopenia, and Kawasaki syndrome. This medicine may be used for other purposes; ask your health care provider or pharmacist if you have questions. COMMON BRAND NAME(S): ASCENIV, Baygam, BIVIGAM, Carimune, Carimune NF, cutaquig, Cuvitru, Flebogamma, Flebogamma DIF, GamaSTAN, GamaSTAN S/D, Gamimune N, Gammagard, Gammagard S/D, Gammaked, Gammaplex, Gammar-P IV, Gamunex, Gamunex-C, Hizentra, Iveegam, Iveegam EN, Octagam, Panglobulin, Panglobulin NF, panzyga, Polygam S/D, Privigen, Sandoglobulin, Venoglobulin-S, Vigam, Vivaglobulin, Xembify What should I tell my health care provider before I take this medicine? They need to know if you have any of these conditions:  diabetes  extremely low or no immune antibodies in the blood  heart disease  history of blood clots  hyperprolinemia  infection in the blood, sepsis  kidney disease  recently received or scheduled to receive a vaccination  an unusual or allergic reaction to human immune globulin, albumin, maltose, sucrose, other medicines, foods, dyes, or preservatives  pregnant or trying to get pregnant  breast-feeding How should I use this medicine? This medicine is for injection into a muscle or infusion into a vein or skin. It is usually given by a health care professional in a hospital or clinic setting. In rare cases, some brands of this medicine might be given at home. You will be taught how to give this medicine. Use exactly as directed. Take your medicine at regular intervals. Do not take your medicine more often than directed. Talk to your pediatrician regarding the use of this medicine in children. While this drug may be prescribed for selected  conditions, precautions do apply. Overdosage: If you think you have taken too much of this medicine contact a poison control center or emergency room at once. NOTE: This medicine is only for you. Do not share this medicine with others. What if I miss a dose? It is important not to miss your dose. Call your doctor or health care professional if you are unable to keep an appointment. If you give yourself the medicine and you miss a dose, take it as soon as you can. If it is almost time for your next dose, take only that dose. Do not take double or extra doses. What may interact with this medicine?  aspirin and aspirin-like medicines  cisplatin  cyclosporine  medicines for infection like acyclovir, adefovir, amphotericin B, bacitracin, cidofovir, foscarnet, ganciclovir, gentamicin, pentamidine, vancomycin  NSAIDS, medicines for pain and inflammation, like ibuprofen or naproxen  pamidronate  vaccines  zoledronic acid This list may not describe all possible interactions. Give your health care provider a list of all the medicines, herbs, non-prescription drugs, or dietary supplements you use. Also tell them if you smoke, drink alcohol, or use illegal drugs. Some items may interact with your medicine. What should I watch for while using this medicine? Your condition will be monitored carefully while you are receiving this medicine. This medicine is made from pooled blood donations of many different people. It may be possible to pass an infection in this medicine. However, the donors are screened for infections and all products are tested for HIV and hepatitis. The medicine is treated to kill most or all bacteria and viruses. Talk to your doctor about the risks and benefits of this medicine. Do not have vaccinations for at least   14 days before, or until at least 3 months after receiving this medicine. What side effects may I notice from receiving this medicine? Side effects that you should report  to your doctor or health care professional as soon as possible:  allergic reactions like skin rash, itching or hives, swelling of the face, lips, or tongue  blue colored lips or skin  breathing problems  chest pain or tightness  fever  signs and symptoms of aseptic meningitis such as stiff neck; sensitivity to light; headache; drowsiness; fever; nausea; vomiting; rash  signs and symptoms of a blood clot such as chest pain; shortness of breath; pain, swelling, or warmth in the leg  signs and symptoms of hemolytic anemia such as fast heartbeat; tiredness; dark yellow or brown urine; or yellowing of the eyes or skin  signs and symptoms of kidney injury like trouble passing urine or change in the amount of urine  sudden weight gain  swelling of the ankles, feet, hands Side effects that usually do not require medical attention (report to your doctor or health care professional if they continue or are bothersome):  diarrhea  flushing  headache  increased sweating  joint pain  muscle cramps  muscle pain  nausea  pain, redness, or irritation at site where injected  tiredness This list may not describe all possible side effects. Call your doctor for medical advice about side effects. You may report side effects to FDA at 1-800-FDA-1088. Where should I keep my medicine? Keep out of the reach of children. This drug is usually given in a hospital or clinic and will not be stored at home. In rare cases, some brands of this medicine may be given at home. If you are using this medicine at home, you will be instructed on how to store this medicine. Throw away any unused medicine after the expiration date on the label. NOTE: This sheet is a summary. It may not cover all possible information. If you have questions about this medicine, talk to your doctor, pharmacist, or health care provider.  2021 Elsevier/Gold Standard (2018-08-10 12:51:14)  

## 2020-06-04 ENCOUNTER — Other Ambulatory Visit: Payer: Self-pay | Admitting: Cardiovascular Disease

## 2020-06-11 DIAGNOSIS — I5030 Unspecified diastolic (congestive) heart failure: Secondary | ICD-10-CM | POA: Diagnosis not present

## 2020-06-11 DIAGNOSIS — J1282 Pneumonia due to coronavirus disease 2019: Secondary | ICD-10-CM | POA: Diagnosis not present

## 2020-06-11 DIAGNOSIS — U071 COVID-19: Secondary | ICD-10-CM | POA: Diagnosis not present

## 2020-06-11 DIAGNOSIS — I480 Paroxysmal atrial fibrillation: Secondary | ICD-10-CM | POA: Diagnosis not present

## 2020-06-11 DIAGNOSIS — A4189 Other specified sepsis: Secondary | ICD-10-CM | POA: Diagnosis not present

## 2020-06-11 DIAGNOSIS — G510 Bell's palsy: Secondary | ICD-10-CM | POA: Diagnosis not present

## 2020-06-13 ENCOUNTER — Encounter: Payer: Self-pay | Admitting: Cardiovascular Disease

## 2020-06-13 ENCOUNTER — Ambulatory Visit (INDEPENDENT_AMBULATORY_CARE_PROVIDER_SITE_OTHER): Payer: Medicare Other | Admitting: Cardiovascular Disease

## 2020-06-13 ENCOUNTER — Other Ambulatory Visit: Payer: Self-pay

## 2020-06-13 VITALS — BP 110/62 | HR 78 | Ht 66.0 in | Wt 205.8 lb

## 2020-06-13 DIAGNOSIS — I4819 Other persistent atrial fibrillation: Secondary | ICD-10-CM | POA: Diagnosis not present

## 2020-06-13 DIAGNOSIS — C9110 Chronic lymphocytic leukemia of B-cell type not having achieved remission: Secondary | ICD-10-CM | POA: Diagnosis not present

## 2020-06-13 DIAGNOSIS — Z01818 Encounter for other preprocedural examination: Secondary | ICD-10-CM

## 2020-06-13 DIAGNOSIS — E669 Obesity, unspecified: Secondary | ICD-10-CM | POA: Diagnosis not present

## 2020-06-13 DIAGNOSIS — R946 Abnormal results of thyroid function studies: Secondary | ICD-10-CM

## 2020-06-13 DIAGNOSIS — R7303 Prediabetes: Secondary | ICD-10-CM | POA: Diagnosis not present

## 2020-06-13 DIAGNOSIS — I5031 Acute diastolic (congestive) heart failure: Secondary | ICD-10-CM | POA: Diagnosis not present

## 2020-06-13 DIAGNOSIS — Z01812 Encounter for preprocedural laboratory examination: Secondary | ICD-10-CM

## 2020-06-13 MED ORDER — POTASSIUM CHLORIDE CRYS ER 20 MEQ PO TBCR: 20.0000 meq | EXTENDED_RELEASE_TABLET | Freq: Every day | ORAL | 5 refills | Status: DC

## 2020-06-13 MED ORDER — FUROSEMIDE 40 MG PO TABS: 40.0000 mg | ORAL_TABLET | Freq: Every day | ORAL | 5 refills | Status: DC

## 2020-06-13 NOTE — Patient Instructions (Signed)
Medication Instructions:  START Furosemide 40 mg once daily. Do not take if your weight is less than 195 pounds  START Potassium 20 mEq once daily. Do not take if your weight is less than 195 pounds.  *If you need a refill on your cardiac medications before your next appointment, please call your pharmacy*   Follow-Up: At [facility], you and your health needs are our priority.  As part of our continuing mission to provide you with exceptional heart care, we have created designated Provider Care Teams.  These Care Teams include your primary Cardiologist (physician) and Advanced Practice Providers (APPs -  Physician Assistants and Nurse Practitioners) who all work together to provide you with the care you need, when you need it.  We recommend signing up for the patient portal called "MyChart".  Sign up information is provided on this After Visit Summary.  MyChart is used to connect with patients for Virtual Visits (Telemedicine).  Patients are able to view lab/test results, encounter notes, upcoming appointments, etc.  Non-urgent messages can be sent to your provider as well.   To learn more about what you can do with MyChart, go to [email or web address].    Your next appointment:   Follow up in one week after the cardioversion on 07/02/20 at the Afib clinic Follow up in 3 months with Dr. Sallyanne Kuster  Other Instructions  You are scheduled for a Cardioversion on 07/02/20 with Dr. Acie Fredrickson.  Please arrive at the [facility] (Main Entrance A) at [facility]: [place], [place] [address] at 8 am. (1 hour prior to procedure)  DIET: Nothing to eat or drink after midnight except a sip of water with medications (see medication instructions below)  Medication Instructions: Hold: nothing to hold  Continue your anticoagulant: Eliquis. Please call the office at [phone] if you miss a dose.  You will need to continue your anticoagulant after your procedure until you  are told by  your provider that it is safe to stop   Labs: Your provider would like for you to return  Friday June 10th to have the following labs drawn: CBC and BMET. You do not need an appointment for the lab. Once in our office lobby there is a podium where you can sign in and ring the doorbell to alert [place] that you are here. The lab is open from 8:00 am to 4:30 pm; closed for lunch from 12:45pm-1:45pm.  You must have a responsible person to drive you home and stay in the waiting area during your procedure. Failure to do so could result in cancellation.  Bring your insurance cards.  *Special Note: Every effort is made to have your procedure done on time. Occasionally there are emergencies that occur at the hospital that may cause delays. Please be patient if a delay does occur.

## 2020-06-13 NOTE — Progress Notes (Signed)
Cardiology Office Note:    Date:  06/13/2020   ID:  IFEOLUWA BARTZ, DOB [birth year], MRN [id number]  PCP:  Lavone Orn, MD  Cardiologist:  Sanda Klein, MD    Referring MD: Lavone Orn, MD   Chief complaint: atrial fibrillation   History of Present Illness:    EDIT RICCIARDELLI is a 80 y.o. female with a hx of paroxysmal atrial tachycardia, and atrial fibrillation.  Additional medical problems include CLL with mild thrombocytopenia, multiple sclerosis with very infrequent problems, ocular migraines and idiopathic peripheral neuropathy.   He was hospitalized 03 17-03 25 with Covid pneumonia and hypoxemia, despite the fact that she had received 2 vaccine doses in a booster.  She was also hyponatremic during that admission and was found to be in persistent atrial fibrillation.  Rate control was challenging and digoxin was added to metoprolol.  TSH was suppressed and free T4 was mildly elevated so she was started on methimazole and is referred to see an endocrinologist (this will not happen until June 13).  An echocardiogram during that hospitalization showed that LVEF remains normal at 65 to 70%.  The left atrium was described as mildly dilated.  There was no significant valvular abnormality detected.  She underwent cardioversion on April 27.  Multiple shocks were administered, but each time she reverted to atrial fibrillation after just a few seconds in sinus rhythm.  She has developed edema of the lower extremities reaching up to the knees bilaterally and has shortness of breath with usual activity (NYHA functional class II).  She finds that she coughs and feels a little short of breath if she lies on her left side and feels better when she turns over onto the right.  She has not had frank orthopnea or PND.  She is compliant with anticoagulation and has not had any falls or serious bleeding problems.  She denies angina or syncope.  She is unaware of the arrhythmia.  She has noticed that her heart  rate is slower now (digoxin and flecainide were both added recently).  She does not have known structural heart disease. She had a normal echo in 2013 at [facility] (normal LV systolic function, mild LVH with septal wall thickness 1.2 cm, borderline left atrial dilation at 3.9 cm, 25 cm). She does not have known coronary artery problems.  Follow-up echo March 2022 showed EF 65 to 70%, mildly dilated left atrium.  Past Medical History:  Diagnosis Date  . Atrial fibrillation ([place])    questionable, echo 03/23/11 - EF >55%, on warfarin  . Bell's palsy   . 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 Charcot-Marie disease   . 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 Gait disturbance   . Gallstones   . 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 IFG (impaired fasting glucose)    e  . Personal history of other diseases of circulatory system   . Tick bite   . Unspecified hereditary and idiopathic peripheral neuropathy   . Vitamin B12 deficiency   . Vitamin D deficiency     Past Surgical History:  Procedure Laterality Date  . ABDOMINAL HYSTERECTOMY  1984   fibroids  . BREAST SURGERY  1960s   benign breast tumors  . CARDIOVERSION N/A 05/15/2020   Procedure: CARDIOVERSION;  Surgeon: Sanda Klein, MD;  Location: [facility] ENDOSCOPY;  Service: Cardiovascular;  Laterality: N/A;  . CATARACT EXTRACTION W/ INTRAOCULAR LENS  IMPLANT, BILATERAL    . LAPAROSCOPIC BILATERAL SALPINGO OOPHERECTOMY  2003  ovarian cyst    Current Medications: Current Meds  Medication Sig  . acetaminophen (TYLENOL) 500 MG tablet Take 500-1,000 mg by mouth 2 (two) times daily as needed (pain.).  Marland Kitchen apixaban (ELIQUIS) 5 MG TABS tablet TAKE 1 TABLET(5 MG) BY MOUTH TWICE DAILY (Patient taking differently: Take 5 mg by mouth 2 (two) times daily. (0800 & 2000))  . cholecalciferol (VITAMIN D) 25 MCG (1000 UNIT) tablet Take 1,000 Units by mouth daily with lunch.  . digoxin (LANOXIN) 0.125 MG tablet Take 1 tablet  (0.125 mg total) by mouth daily.  . flecainide (TAMBOCOR) 50 MG tablet TAKE 1 TABLET(50 MG) BY MOUTH TWICE DAILY  . furosemide (LASIX) 40 MG tablet Take 1 tablet (40 mg total) by mouth daily. Do not take if your weight is less than 195 lbs  . metoprolol tartrate (LOPRESSOR) 100 MG tablet Take 1 tablet (100 mg total) by mouth 2 (two) times daily. (Patient taking differently: Take 100 mg by mouth 2 (two) times daily. (0800 & 2000))  . Polyethyl Glycol-Propyl Glycol (SYSTANE) 0.4-0.3 % SOLN Place 1-2 drops into both eyes in the morning and at bedtime.  . potassium chloride SA (KLOR-CON M20) 20 MEQ tablet Take 1 tablet (20 mEq total) by mouth daily. Do not take if your weight is less than 195 lbs  . vitamin B-12 (CYANOCOBALAMIN) 1000 MCG tablet Take 1,000 mcg by mouth daily with lunch.     Allergies:   Clindamycin, Celebrex [celecoxib], Statins, Tape, Tricor [fenofibrate], Amoxicillin, Clarithromycin, Latex, and Methimazole   Social History   Socioeconomic History  . Marital status: Married    Spouse name: Colen  . Number of children: 3  . Years of education: college  . Highest education level: Not on file  Occupational History    Comment: Retired  Tobacco Use  . Smoking status: Never Smoker  . Smokeless tobacco: Never Used  Substance and Sexual Activity  . Alcohol use: No    Alcohol/week: 0.0 standard drinks  . Drug use: No  . Sexual activity: Not on file  Other Topics Concern  . 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 Patient has college education.    Right handed.   Caffeine- not every day . Soda or tea when she is out for dinner.   Social Determinants of Health   Financial Resource Strain: Not on file  Food Insecurity: Not on file  Transportation Needs: Not on file  Physical Activity: Not on file  Stress: Not on file  Social Connections: Not on file     Family History: The patient's family history includes Cancer in her  daughter; Coronary artery disease in her brother and father; Diabetes in her brother and father; Hypertension in her brother, father, and mother. ROS:   Please see the history of present illness.    All other systems are reviewed and are negative EKGs/Labs/Other Studies Reviewed:   11/01/2017 echo - Left ventricle: The cavity size was normal. There was severe   concentric hypertrophy. Systolic function was vigorous. The   estimated ejection fraction was in the range of 65% to 70%. Wall   motion was normal; there were no regional wall motion   abnormalities. Doppler parameters are consistent with abnormal   left ventricular relaxation (grade 1 diastolic dysfunction). - Aortic valve: Trileaflet; mildly thickened leaflets. - Mitral valve: Moderately thickened, mildly calcified leaflets . - Right ventricle: Systolic function was normal. - Right atrium: The atrium was normal  in size. - Tricuspid valve: There was mild regurgitation. - Pulmonary arteries: Systolic pressure was within the normal   range. - Pericardium, extracardiac: There was no pericardial effusion.  Echo 04/07/2018 1. Left ventricular ejection fraction, by estimation, is 65 to 70%. The  left ventricle has normal function. The left ventricle has no regional  wall motion abnormalities. Left ventricular diastolic function could not  be evaluated.  2. Right ventricular systolic function is mildly reduced. The right  ventricular size is mildly enlarged. There is mildly elevated pulmonary  artery systolic pressure. The estimated right ventricular systolic  pressure is 84.6 mmHg.  3. Left atrial size was mildly dilated.  4. Right atrial size was mildly dilated.  5. The mitral valve was not assessed. No evidence of mitral valve  regurgitation. No evidence of mitral stenosis.  6. The aortic valve was not assessed. Aortic valve regurgitation is not  visualized. No aortic stenosis is present.  7. The inferior vena cava is  normal in size with greater than 50%  respiratory variability, suggesting right atrial pressure of 3 mmHg.   EKG:  EKG is ordered today.  Personally reviewed, shows atrial fibrillation with good rate control and is otherwise normal. Recent Labs: 04/05/2020: B Natriuretic Peptide 282.2 04/10/2020: Magnesium 2.3 05/21/2020: ALT 10; BUN 12; Creatinine, Ser 0.73; Hemoglobin 12.3; Platelets 107; Potassium 4.3; Sodium 141; TSH 2.300  Recent Lipid Panel No results found for: CHOL, TRIG, HDL, CHOLHDL, VLDL, LDLCALC, LDLDIRECT  Physical Exam:    VS:  BP 110/62 (BP Location: Left Arm, Patient Position: Sitting, Cuff Size: Normal)   Pulse 78   Ht 5\' 6"  (1.676 m)   Wt 205 lb 12.8 oz (93.4 kg)   BMI 33.22 kg/m     Wt Readings from Last 3 Encounters:  06/13/20 205 lb 12.8 oz (93.4 kg)  05/23/20 207 lb 11.2 oz (94.2 kg)  05/15/20 197 lb (89.4 kg)     General: Alert, oriented x3, no distress, mildly obese Head: no evidence of trauma, PERRL, EOMI, no exophtalmos or lid lag, no myxedema, no xanthelasma; normal ears, nose and oropharynx Neck: normal jugular venous pulsations and no hepatojugular reflux; brisk carotid pulses without delay and no carotid bruits Chest: clear to auscultation, no signs of consolidation by percussion or palpation, normal fremitus, symmetrical and full respiratory excursions Cardiovascular: normal position and quality of the apical impulse, irregular rhythm, normal first and second heart sounds, no murmurs, rubs or gallops Abdomen: no tenderness or distention, no masses by palpation, no abnormal pulsatility or arterial bruits, normal bowel sounds, no hepatosplenomegaly Extremities: no clubbing, cyanosis or edema; 2+ radial, ulnar and brachial pulses bilaterally; 2+ right femoral, posterior tibial and dorsalis pedis pulses; 2+ left femoral, posterior tibial and dorsalis pedis pulses; no subclavian or femoral bruits Neurological: grossly nonfocal Psych: Normal mood and  affect     ASSESSMENT:    1. Persistent atrial fibrillation ([place])   2. Pre-procedure lab exam   3. Acute diastolic heart failure ([place])   4. Abnormal thyroid function test   5. Prediabetes   6. Mild obesity   7. CLL (chronic lymphocytic leukemia) ([facility])    PLAN:    In order of problems listed above:  1. AFib: This is her first episode of persistent atrial fibrillation.  She has a good chance of return to normal rhythm since her left atrium is only mildly dilated then she does not have much in the way of structural heart disease.  However, she failed to maintain sinus  rhythm after cardioversion.  The arrhythmia appears to be causing heart failure decompensation.  She is about 10 pounds above her pre-arrhythmia weight.  She has been on uninterrupted anticoagulation.  We will schedule for repeat attempt at cardioversion.  We will try to diurese a little bit first and she prefers to have the procedure done after June 9, when school is out.Marland Kitchen CHADSVasc 3 (age 65, gender).  Continue metoprolol and digoxin for rate control, continue flecainide and Eliquis. 2. Ac diast HF: It seems that she has developed some findings of congestive heart failure with biventricular manifestations, likely due to the lengthy persistent episode of atrial fibrillation (despite the fact that she is well rate controlled).  Start furosemide 40 mg once daily and potassium supplements.  Dry weight seems to be around 197-201 pounds.  Asked her to stop taking the furosemide on days when her weight is 195 pounds or less.  We will recheck potassium and renal function tests just before her cardioversion. 3. Abnormal thyroid tests: Abnormal labs in March were probably caused by sick euthyroid syndrome rather than true thyrotoxicosis.  Labs rechecked in April and May were normal. 4. Prediabetes: A1c 6.1% off medications. 5. Mild obesity: Weight loss would help with correction of metabolic abnormalities and reduce the likelihood of atrial  fibrillation recurrence. 6. CLL: Mild stable thrombocytopenia.  She is not anemic.  The risks of benefits of cardioversion and sedation were discussed again in detail with the patient and she agrees to proceed.  Patient Instructions  Medication Instructions:  START Furosemide 40 mg once daily. Do not take if your weight is less than 195 pounds  START Potassium 20 mEq once daily. Do not take if your weight is less than 195 pounds.  *If you need a refill on your cardiac medications before your next appointment, please call your pharmacy*   Follow-Up: At [facility], you and your health needs are our priority.  As part of our continuing mission to provide you with exceptional heart care, we have created designated Provider Care Teams.  These Care Teams include your primary Cardiologist (physician) and Advanced Practice Providers (APPs -  Physician Assistants and Nurse Practitioners) who all work together to provide you with the care you need, when you need it.  We recommend signing up for the patient portal called "MyChart".  Sign up information is provided on this After Visit Summary.  MyChart is used to connect with patients for Virtual Visits (Telemedicine).  Patients are able to view lab/test results, encounter notes, upcoming appointments, etc.  Non-urgent messages can be sent to your provider as well.   To learn more about what you can do with MyChart, go to [email or web address].    Your next appointment:   Follow up in one week after the cardioversion on 07/02/20 at the Afib clinic Follow up in 3 months with Dr. Sallyanne Kuster  Other Instructions  You are scheduled for a Cardioversion on 07/02/20 with Dr. Acie Fredrickson.  Please arrive at the [facility] (Main Entrance A) at [facility]: [place], [place] [address] at 8 am. (1 hour prior to procedure)  DIET: Nothing to eat or drink after midnight except a sip of water with medications (see medication instructions  below)  Medication Instructions: Hold: nothing to hold  Continue your anticoagulant: Eliquis. Please call the office at [phone] if you miss a dose.  You will need to continue your anticoagulant after your procedure until you  are told by your provider that it  is safe to stop   Labs: Your provider would like for you to return  Friday June 10th to have the following labs drawn: CBC and BMET. You do not need an appointment for the lab. Once in our office lobby there is a podium where you can sign in and ring the doorbell to alert [place] that you are here. The lab is open from 8:00 am to 4:30 pm; closed for lunch from 12:45pm-1:45pm.  You must have a responsible person to drive you home and stay in the waiting area during your procedure. Failure to do so could result in cancellation.  Bring your insurance cards.  *Special Note: Every effort is made to have your procedure done on time. Occasionally there are emergencies that occur at the hospital that may cause delays. Please be patient if a delay does occur.      Signed, Sanda Klein, MD  06/13/2020 11:11 AM    [place]

## 2020-06-13 NOTE — H&P (View-Only) (Signed)
Cardiology Office Note:    Date:  06/13/2020   ID:  Tasha [name], DOB [birth year], MRN [id number]  PCP:  Lavone Orn, MD  Cardiologist:  Sanda Klein, MD    Referring MD: Lavone Orn, MD   Chief complaint: atrial fibrillation   History of Present Illness:    Tasha [name] is a 80 y.o. female with a hx of paroxysmal atrial tachycardia, and atrial fibrillation.  Additional medical problems include CLL with mild thrombocytopenia, multiple sclerosis with very infrequent problems, ocular migraines and idiopathic peripheral neuropathy.   He was hospitalized 03 17-03 25 with Covid pneumonia and hypoxemia, despite the fact that she had received 2 vaccine doses in a booster.  She was also hyponatremic during that admission and was found to be in persistent atrial fibrillation.  Rate control was challenging and digoxin was added to metoprolol.  TSH was suppressed and free T4 was mildly elevated so she was started on methimazole and is referred to see an endocrinologist (this will not happen until June 13).  An echocardiogram during that hospitalization showed that LVEF remains normal at 65 to 70%.  The left atrium was described as mildly dilated.  There was no significant valvular abnormality detected.  She underwent cardioversion on April 27.  Multiple shocks were administered, but each time she reverted to atrial fibrillation after just a few seconds in sinus rhythm.  She has developed edema of the lower extremities reaching up to the knees bilaterally and has shortness of breath with usual activity (NYHA functional class II).  She finds that she coughs and feels a little short of breath if she lies on her left side and feels better when she turns over onto the right.  She has not had frank orthopnea or PND.  She is compliant with anticoagulation and has not had any falls or serious bleeding problems.  She denies angina or syncope.  She is unaware of the arrhythmia.  She has noticed that her heart  rate is slower now (digoxin and flecainide were both added recently).  She does not have known structural heart disease. She had a normal echo in 2013 at [facility] (normal LV systolic function, mild LVH with septal wall thickness 1.2 cm, borderline left atrial dilation at 3.9 cm, 25 cm). She does not have known coronary artery problems.  Follow-up echo March 2022 showed EF 65 to 70%, mildly dilated left atrium.  Past Medical History:  Diagnosis Date  . Atrial fibrillation ([place])    questionable, echo 03/23/11 - EF >55%, on warfarin  . Bell's palsy   . 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 Charcot-Marie disease   . 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 Gait disturbance   . Gallstones   . 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 IFG (impaired fasting glucose)    e  . Personal history of other diseases of circulatory system   . Tick bite   . Unspecified hereditary and idiopathic peripheral neuropathy   . Vitamin B12 deficiency   . Vitamin D deficiency     Past Surgical History:  Procedure Laterality Date  . ABDOMINAL HYSTERECTOMY  1984   fibroids  . BREAST SURGERY  1960s   benign breast tumors  . CARDIOVERSION N/A 05/15/2020   Procedure: CARDIOVERSION;  Surgeon: Sanda Klein, MD;  Location: [facility];  Service: Cardiovascular;  Laterality: N/A;  . CATARACT EXTRACTION W/ INTRAOCULAR LENS  IMPLANT, BILATERAL    . LAPAROSCOPIC BILATERAL SALPINGO OOPHERECTOMY  2003  ovarian cyst    Current Medications: Current Meds  Medication Sig  . acetaminophen (TYLENOL) 500 MG tablet Take 500-1,000 mg by mouth 2 (two) times daily as needed (pain.).  Marland Kitchen apixaban (ELIQUIS) 5 MG TABS tablet TAKE 1 TABLET(5 MG) BY MOUTH TWICE DAILY (Patient taking differently: Take 5 mg by mouth 2 (two) times daily. (0800 & 2000))  . cholecalciferol (VITAMIN D) 25 MCG (1000 UNIT) tablet Take 1,000 Units by mouth daily with lunch.  . digoxin (LANOXIN) 0.125 MG tablet Take 1 tablet  (0.125 mg total) by mouth daily.  . flecainide (TAMBOCOR) 50 MG tablet TAKE 1 TABLET(50 MG) BY MOUTH TWICE DAILY  . furosemide (LASIX) 40 MG tablet Take 1 tablet (40 mg total) by mouth daily. Do not take if your weight is less than 195 lbs  . metoprolol tartrate (LOPRESSOR) 100 MG tablet Take 1 tablet (100 mg total) by mouth 2 (two) times daily. (Patient taking differently: Take 100 mg by mouth 2 (two) times daily. (0800 & 2000))  . Polyethyl Glycol-Propyl Glycol (SYSTANE) 0.4-0.3 % SOLN Place 1-2 drops into both eyes in the morning and at bedtime.  . potassium chloride SA (KLOR-CON M20) 20 MEQ tablet Take 1 tablet (20 mEq total) by mouth daily. Do not take if your weight is less than 195 lbs  . vitamin B-12 (CYANOCOBALAMIN) 1000 MCG tablet Take 1,000 mcg by mouth daily with lunch.     Allergies:   Clindamycin, Celebrex [celecoxib], Statins, Tape, Tricor [fenofibrate], Amoxicillin, Clarithromycin, Latex, and Methimazole   Social History   Socioeconomic History  . Marital status: Married    Spouse name: Colen  . Number of children: 3  . Years of education: college  . Highest education level: Not on file  Occupational History    Comment: Retired  Tobacco Use  . Smoking status: Never Smoker  . Smokeless tobacco: Never Used  Substance and Sexual Activity  . Alcohol use: No    Alcohol/week: 0.0 standard drinks  . Drug use: No  . Sexual activity: Not on file  Other Topics Concern  . 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 Patient has college education.    Right handed.   Caffeine- not every day . Soda or tea when she is out for dinner.   Social Determinants of Health   Financial Resource Strain: Not on file  Food Insecurity: Not on file  Transportation Needs: Not on file  Physical Activity: Not on file  Stress: Not on file  Social Connections: Not on file     Family History: The patient's family history includes Cancer in her  daughter; Coronary artery disease in her brother and father; Diabetes in her brother and father; Hypertension in her brother, father, and mother. ROS:   Please see the history of present illness.    All other systems are reviewed and are negative EKGs/Labs/Other Studies Reviewed:   11/01/2017 echo - Left ventricle: The cavity size was normal. There was severe   concentric hypertrophy. Systolic function was vigorous. The   estimated ejection fraction was in the range of 65% to 70%. Wall   motion was normal; there were no regional wall motion   abnormalities. Doppler parameters are consistent with abnormal   left ventricular relaxation (grade 1 diastolic dysfunction). - Aortic valve: Trileaflet; mildly thickened leaflets. - Mitral valve: Moderately thickened, mildly calcified leaflets . - Right ventricle: Systolic function was normal. - Right atrium: The atrium was normal  in size. - Tricuspid valve: There was mild regurgitation. - Pulmonary arteries: Systolic pressure was within the normal   range. - Pericardium, extracardiac: There was no pericardial effusion.  Echo 04/07/2018 1. Left ventricular ejection fraction, by estimation, is 65 to 70%. The  left ventricle has normal function. The left ventricle has no regional  wall motion abnormalities. Left ventricular diastolic function could not  be evaluated.  2. Right ventricular systolic function is mildly reduced. The right  ventricular size is mildly enlarged. There is mildly elevated pulmonary  artery systolic pressure. The estimated right ventricular systolic  pressure is 65.7 mmHg.  3. Left atrial size was mildly dilated.  4. Right atrial size was mildly dilated.  5. The mitral valve was not assessed. No evidence of mitral valve  regurgitation. No evidence of mitral stenosis.  6. The aortic valve was not assessed. Aortic valve regurgitation is not  visualized. No aortic stenosis is present.  7. The inferior vena cava is  normal in size with greater than 50%  respiratory variability, suggesting right atrial pressure of 3 mmHg.   EKG:  EKG is ordered today.  Personally reviewed, shows atrial fibrillation with good rate control and is otherwise normal. Recent Labs: 04/05/2020: B Natriuretic Peptide 282.2 04/10/2020: Magnesium 2.3 05/21/2020: ALT 10; BUN 12; Creatinine, Ser 0.73; Hemoglobin 12.3; Platelets 107; Potassium 4.3; Sodium 141; TSH 2.300  Recent Lipid Panel No results found for: CHOL, TRIG, HDL, CHOLHDL, VLDL, LDLCALC, LDLDIRECT  Physical Exam:    VS:  BP 110/62 (BP Location: Left Arm, Patient Position: Sitting, Cuff Size: Normal)   Pulse 78   Ht 5\' 6"  (1.676 m)   Wt 205 lb 12.8 oz (93.4 kg)   BMI 33.22 kg/m     Wt Readings from Last 3 Encounters:  06/13/20 205 lb 12.8 oz (93.4 kg)  05/23/20 207 lb 11.2 oz (94.2 kg)  05/15/20 197 lb (89.4 kg)     General: Alert, oriented x3, no distress, mildly obese Head: no evidence of trauma, PERRL, EOMI, no exophtalmos or lid lag, no myxedema, no xanthelasma; normal ears, nose and oropharynx Neck: normal jugular venous pulsations and no hepatojugular reflux; brisk carotid pulses without delay and no carotid bruits Chest: clear to auscultation, no signs of consolidation by percussion or palpation, normal fremitus, symmetrical and full respiratory excursions Cardiovascular: normal position and quality of the apical impulse, irregular rhythm, normal first and second heart sounds, no murmurs, rubs or gallops Abdomen: no tenderness or distention, no masses by palpation, no abnormal pulsatility or arterial bruits, normal bowel sounds, no hepatosplenomegaly Extremities: no clubbing, cyanosis or edema; 2+ radial, ulnar and brachial pulses bilaterally; 2+ right femoral, posterior tibial and dorsalis pedis pulses; 2+ left femoral, posterior tibial and dorsalis pedis pulses; no subclavian or femoral bruits Neurological: grossly nonfocal Psych: Normal mood and  affect     ASSESSMENT:    1. Persistent atrial fibrillation ([place])   2. Pre-procedure lab exam   3. Acute diastolic heart failure ([place])   4. Abnormal thyroid function test   5. Prediabetes   6. Mild obesity   7. CLL (chronic lymphocytic leukemia) ([facility])    PLAN:    In order of problems listed above:  1. AFib: This is her first episode of persistent atrial fibrillation.  She has a good chance of return to normal rhythm since her left atrium is only mildly dilated then she does not have much in the way of structural heart disease.  However, she failed to maintain sinus  rhythm after cardioversion.  The arrhythmia appears to be causing heart failure decompensation.  She is about 10 pounds above her pre-arrhythmia weight.  She has been on uninterrupted anticoagulation.  We will schedule for repeat attempt at cardioversion.  We will try to diurese a little bit first and she prefers to have the procedure done after June 9, when school is out.Marland Kitchen CHADSVasc 3 (age 82, gender).  Continue metoprolol and digoxin for rate control, continue flecainide and Eliquis. 2. Ac diast HF: It seems that she has developed some findings of congestive heart failure with biventricular manifestations, likely due to the lengthy persistent episode of atrial fibrillation (despite the fact that she is well rate controlled).  Start furosemide 40 mg once daily and potassium supplements.  Dry weight seems to be around 197-201 pounds.  Asked her to stop taking the furosemide on days when her weight is 195 pounds or less.  We will recheck potassium and renal function tests just before her cardioversion. 3. Abnormal thyroid tests: Abnormal labs in March were probably caused by sick euthyroid syndrome rather than true thyrotoxicosis.  Labs rechecked in April and May were normal. 4. Prediabetes: A1c 6.1% off medications. 5. Mild obesity: Weight loss would help with correction of metabolic abnormalities and reduce the likelihood of atrial  fibrillation recurrence. 6. CLL: Mild stable thrombocytopenia.  She is not anemic.  The risks of benefits of cardioversion and sedation were discussed again in detail with the patient and she agrees to proceed.  Patient Instructions  Medication Instructions:  START Furosemide 40 mg once daily. Do not take if your weight is less than 195 pounds  START Potassium 20 mEq once daily. Do not take if your weight is less than 195 pounds.  *If you need a refill on your cardiac medications before your next appointment, please call your pharmacy*   Follow-Up: At [facility], you and your health needs are our priority.  As part of our continuing mission to provide you with exceptional heart care, we have created designated Provider Care Teams.  These Care Teams include your primary Cardiologist (physician) and Advanced Practice Providers (APPs -  Physician Assistants and Nurse Practitioners) who all work together to provide you with the care you need, when you need it.  We recommend signing up for the patient portal called "MyChart".  Sign up information is provided on this After Visit Summary.  MyChart is used to connect with patients for Virtual Visits (Telemedicine).  Patients are able to view lab/test results, encounter notes, upcoming appointments, etc.  Non-urgent messages can be sent to your provider as well.   To learn more about what you can do with MyChart, go to [email or web address].    Your next appointment:   Follow up in one week after the cardioversion on 07/02/20 at the Afib clinic Follow up in 3 months with Dr. Sallyanne Kuster  Other Instructions  You are scheduled for a Cardioversion on 07/02/20 with Dr. Acie Fredrickson.  Please arrive at the [facility] (Main Entrance A) at [facility]: [place], [place] [address] at 8 am. (1 hour prior to procedure)  DIET: Nothing to eat or drink after midnight except a sip of water with medications (see medication instructions  below)  Medication Instructions: Hold: nothing to hold  Continue your anticoagulant: Eliquis. Please call the office at [phone] if you miss a dose.  You will need to continue your anticoagulant after your procedure until you  are told by your provider that it  is safe to stop   Labs: Your provider would like for you to return  Friday June 10th to have the following labs drawn: CBC and BMET. You do not need an appointment for the lab. Once in our office lobby there is a podium where you can sign in and ring the doorbell to alert [place] that you are here. The lab is open from 8:00 am to 4:30 pm; closed for lunch from 12:45pm-1:45pm.  You must have a responsible person to drive you home and stay in the waiting area during your procedure. Failure to do so could result in cancellation.  Bring your insurance cards.  *Special Note: Every effort is made to have your procedure done on time. Occasionally there are emergencies that occur at the hospital that may cause delays. Please be patient if a delay does occur.      Signed, Sanda Klein, MD  06/13/2020 11:11 AM    [place]

## 2020-06-27 DIAGNOSIS — Z01812 Encounter for preprocedural laboratory examination: Secondary | ICD-10-CM | POA: Diagnosis not present

## 2020-06-27 DIAGNOSIS — I4819 Other persistent atrial fibrillation: Secondary | ICD-10-CM | POA: Diagnosis not present

## 2020-06-29 LAB — CBC
Hematocrit: 41.9 % (ref 34.0–46.6)
Hemoglobin: 13.3 g/dL (ref 11.1–15.9)
MCH: 27.3 pg (ref 26.6–33.0)
MCHC: 31.7 g/dL (ref 31.5–35.7)
MCV: 86 fL (ref 79–97)
Platelets: 94 10*3/uL — CL (ref 150–450)
RBC: 4.87 x10E6/uL (ref 3.77–5.28)
RDW: 14.5 % (ref 11.7–15.4)
WBC: 3.4 10*3/uL (ref 3.4–10.8)

## 2020-06-29 LAB — BASIC METABOLIC PANEL
BUN/Creatinine Ratio: 21 (ref 12–28)
BUN: 15 mg/dL (ref 8–27)
CO2: 22 mmol/L (ref 20–29)
Calcium: 9 mg/dL (ref 8.7–10.3)
Chloride: 103 mmol/L (ref 96–106)
Creatinine, Ser: 0.71 mg/dL (ref 0.57–1.00)
Glucose: 184 mg/dL — ABNORMAL HIGH (ref 65–99)
Potassium: 4.4 mmol/L (ref 3.5–5.2)
Sodium: 141 mmol/L (ref 134–144)
eGFR: 87 mL/min/{1.73_m2} (ref >59)

## 2020-07-01 ENCOUNTER — Encounter: Payer: Self-pay | Admitting: Internal Medicine

## 2020-07-01 ENCOUNTER — Ambulatory Visit (INDEPENDENT_AMBULATORY_CARE_PROVIDER_SITE_OTHER): Payer: Medicare Other | Admitting: Internal Medicine

## 2020-07-01 ENCOUNTER — Other Ambulatory Visit: Payer: Self-pay

## 2020-07-01 VITALS — BP 122/78 | HR 97 | Ht 66.0 in | Wt 199.0 lb

## 2020-07-01 DIAGNOSIS — E0590 Thyrotoxicosis, unspecified without thyrotoxic crisis or storm: Secondary | ICD-10-CM | POA: Diagnosis not present

## 2020-07-01 DIAGNOSIS — E0781 Sick-euthyroid syndrome: Secondary | ICD-10-CM | POA: Diagnosis not present

## 2020-07-01 LAB — T4, FREE: Free T4: 0.66 ng/dL (ref 0.60–1.60)

## 2020-07-01 LAB — TSH: TSH: 2.25 u[IU]/mL (ref 0.35–4.50)

## 2020-07-01 LAB — T3, FREE: T3, Free: 3.3 pg/mL (ref 2.3–4.2)

## 2020-07-01 MED ORDER — POTASSIUM CHLORIDE CRYS ER 20 MEQ PO TBCR: 20.0000 meq | EXTENDED_RELEASE_TABLET | ORAL | 3 refills | Status: DC

## 2020-07-01 NOTE — Progress Notes (Signed)
Name: Tasha [name]  MRN/ DOB: [id number], 01-04-1941    Age/ Sex: 80 y.o., female    PCP: Lavone Orn, MD   Reason for Endocrinology Evaluation: Hyperthyroidism     Date of Initial Endocrinology Evaluation: 07/01/2020     HPI: [name] is a 80 y.o. female with a past medical history of PAF, CLL , low bone density and dyslipidemia. The patient presented for initial endocrinology clinic visit on 07/01/2020 for consultative assistance with her hyperthyroidism.   She was noted with hyperthyroidism during admission for COVID -19 infection in 03/2020 with a TSh of 0.121 uIU/mL and was started on Methimazole at the time for just a short period of time ~ a couple of weeks.    TRAb was negative < 1.10 IU/L   Recently has noted stable weight   Has A.Fib and scheduled for cardioversion tomorrow , denies palpitations  Denies constipation or diarrhea  Denies local neck symptoms NO tremors  Has noted excessive sweating   Father with thyroid disease  No Biotin intake      HISTORY:  Past Medical History:  Past Medical History:  Diagnosis Date  . Atrial fibrillation ([place])    questionable, echo 03/23/11 - EF >55%, on warfarin  . Bell's palsy   . 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 Charcot-Marie disease   . 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 Gait disturbance   . Gallstones   . 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 IFG (impaired fasting glucose)    e  . Personal history of other diseases of circulatory system   . Tick bite   . Unspecified hereditary and idiopathic peripheral neuropathy   . Vitamin B12 deficiency   . Vitamin D deficiency    Past Surgical History:  Past Surgical History:  Procedure Laterality Date  . ABDOMINAL HYSTERECTOMY  1984   fibroids  . BREAST SURGERY  1960s   benign breast tumors  . CARDIOVERSION N/A 05/15/2020   Procedure: CARDIOVERSION;  Surgeon: Sanda Klein, MD;  Location: [facility];   Service: Cardiovascular;  Laterality: N/A;  . CATARACT EXTRACTION W/ INTRAOCULAR LENS  IMPLANT, BILATERAL    . LAPAROSCOPIC BILATERAL SALPINGO OOPHERECTOMY  2003   ovarian cyst    Social History:  reports that she has never smoked. She has never used smokeless tobacco. She reports that she does not drink alcohol and does not use drugs. Family History: family history includes Cancer in her daughter; Coronary artery disease in her brother and father; Diabetes in her brother and father; Hypertension in her brother, father, and mother.   HOME MEDICATIONS: Allergies as of 07/01/2020       Reactions   Clindamycin Itching, Rash   Celebrex [celecoxib] Other (See Comments)   Hypertension, possible TIA   Statins Other (See Comments)   Muscle aches   Tape    Patient CAN tolerate [place] Wrap only (NO TAPE!!)   Tricor [fenofibrate] Other (See Comments)   Elevated liver enzymes   Amoxicillin Rash   Clarithromycin Rash   Latex Rash   Methimazole Rash        Medication List        Accurate as of July 01, 2020  8:40 AM. If you have any questions, ask your nurse or doctor.          acetaminophen 500 MG tablet Commonly known as: TYLENOL Take 500-1,000 mg by mouth 2 (two) times daily as needed (pain.).   digoxin  0.125 MG tablet Commonly known as: LANOXIN Take 1 tablet (0.125 mg total) by mouth daily.   Eliquis 5 MG Tabs tablet Generic drug: apixaban TAKE 1 TABLET(5 MG) BY MOUTH TWICE DAILY What changed:  how much to take how to take this when to take this additional instructions   flecainide 50 MG tablet Commonly known as: TAMBOCOR TAKE 1 TABLET(50 MG) BY MOUTH TWICE DAILY What changed: See the new instructions.   furosemide 40 MG tablet Commonly known as: LASIX Take 1 tablet (40 mg total) by mouth daily. Do not take if your weight is less than 195 lbs What changed: when to take this   metoprolol tartrate 100 MG tablet Commonly known as: LOPRESSOR Take 1 tablet (100 mg  total) by mouth 2 (two) times daily. What changed: additional instructions   potassium chloride SA 20 MEQ tablet Commonly known as: [place]-Con M20 Take 1 tablet (20 mEq total) by mouth See admin instructions. Do not take if your weight is less than 195 lbs   Systane 0.4-0.3 % Soln Generic drug: Polyethyl Glycol-Propyl Glycol Place 1-2 drops into both eyes in the morning and at bedtime.          REVIEW OF SYSTEMS: A comprehensive ROS was conducted with the patient and is negative except as per HPI and below:  ROS     OBJECTIVE:  VS: BP 122/78   Pulse 97   Ht 5\' 6"  (1.676 m)   Wt 199 lb (90.3 kg)   SpO2 98%   BMI 32.12 kg/m    Wt Readings from Last 3 Encounters:  07/01/20 199 lb (90.3 kg)  06/13/20 205 lb 12.8 oz (93.4 kg)  05/23/20 207 lb 11.2 oz (94.2 kg)     EXAM: General: Pt appears well and is in NAD  Eyes: External eye exam normal without stare, lid lag or exophthalmos.    Neck: General: Supple without adenopathy. Thyroid: Thyroid size normal.  No goiter or nodules appreciated. No thyroid bruit.  Lungs: Clear with good BS bilat with no rales, rhonchi, or wheezes  Heart: Auscultation: RRR.  Abdomen: Normoactive bowel sounds, soft, nontender, without masses or organomegaly palpable  Extremities:  BL LE: 1+  pretibial edema normal ROM and strength.  Skin: Hair: Texture and amount normal with gender appropriate distribution Skin Inspection: No rashes Skin Palpation: Skin temperature, texture, and thickness normal to palpation  Mental Status: Judgment, insight: Intact Orientation: Oriented to time, place, and person Mood and affect: No depression, anxiety, or agitation     DATA REVIEWED:   Results for NORITA, MEIGS (MRN [id number]) as of 07/01/2020 15:40  Ref. 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  Patient will remain off methimazole No treatment is necessary anymore Follow-up as needed  Signed electronically by: Mack Guise, MD  [facility]  [facility] [address] [place], [place] [address] Phone: [phone] FAX: [phone]   CC: Lavone Orn, MD [facility] [address] [employer] [address] [place] [address] Phone: [phone] Fax: [phone]   Return to Endocrinology clinic  as below: Future Appointments  Date Time Provider [place]  07/04/2020 10:00 AM [facility] None  07/10/2020 12:30 PM Magrinat, Virgie Dad, MD [facility] None  07/11/2020 10:30 AM Sherran Needs, NP [facility] None  09/11/2020  9:00 AM [facility] None  09/11/2020  9:30 AM Magrinat, Virgie Dad, MD [facility] None  09/25/2020  9:20 AM Croitoru, Dani Gobble, MD [facility]

## 2020-07-02 ENCOUNTER — Ambulatory Visit (HOSPITAL_COMMUNITY)
Admission: RE | Admit: 2020-07-02 | Discharge: 2020-07-02 | Disposition: A | Discharge status: Home / Self Care | Payer: Medicare Other | Attending: Cardiovascular Disease | Admitting: Cardiovascular Disease

## 2020-07-02 ENCOUNTER — Ambulatory Visit (HOSPITAL_COMMUNITY): Payer: Medicare Other | Admitting: Certified Registered Nurse Anesthetist

## 2020-07-02 ENCOUNTER — Other Ambulatory Visit: Payer: Self-pay

## 2020-07-02 ENCOUNTER — Encounter (HOSPITAL_COMMUNITY): Payer: Self-pay | Admitting: Cardiovascular Disease

## 2020-07-02 ENCOUNTER — Encounter (HOSPITAL_COMMUNITY)
Admission: RE | Disposition: A | Discharge status: Home / Self Care | Payer: Self-pay | Source: Home / Self Care | Attending: Cardiovascular Disease

## 2020-07-02 DIAGNOSIS — E669 Obesity, unspecified: Secondary | ICD-10-CM | POA: Diagnosis not present

## 2020-07-02 DIAGNOSIS — C9110 Chronic lymphocytic leukemia of B-cell type not having achieved remission: Secondary | ICD-10-CM | POA: Diagnosis not present

## 2020-07-02 DIAGNOSIS — I4819 Other persistent atrial fibrillation: Secondary | ICD-10-CM | POA: Diagnosis not present

## 2020-07-02 DIAGNOSIS — E876 Hypokalemia: Secondary | ICD-10-CM | POA: Diagnosis not present

## 2020-07-02 DIAGNOSIS — Z7901 Long term (current) use of anticoagulants: Secondary | ICD-10-CM | POA: Insufficient documentation

## 2020-07-02 DIAGNOSIS — R946 Abnormal results of thyroid function studies: Secondary | ICD-10-CM | POA: Insufficient documentation

## 2020-07-02 DIAGNOSIS — I5031 Acute diastolic (congestive) heart failure: Secondary | ICD-10-CM | POA: Diagnosis not present

## 2020-07-02 DIAGNOSIS — E871 Hypo-osmolality and hyponatremia: Secondary | ICD-10-CM | POA: Diagnosis not present

## 2020-07-02 DIAGNOSIS — Z6833 Body mass index (BMI) 33.0-33.9, adult: Secondary | ICD-10-CM | POA: Insufficient documentation

## 2020-07-02 DIAGNOSIS — Z79899 Other long term (current) drug therapy: Secondary | ICD-10-CM | POA: Insufficient documentation

## 2020-07-02 DIAGNOSIS — R7303 Prediabetes: Secondary | ICD-10-CM | POA: Diagnosis not present

## 2020-07-02 DIAGNOSIS — E559 Vitamin D deficiency, unspecified: Secondary | ICD-10-CM | POA: Diagnosis not present

## 2020-07-02 HISTORY — PX: CARDIOVERSION: SHX1299

## 2020-07-02 SURGERY — CARDIOVERSION
Anesthesia: General

## 2020-07-02 MED ORDER — LIDOCAINE HCL (CARDIAC) PF 100 MG/5ML IV SOSY
PREFILLED_SYRINGE | INTRAVENOUS | Status: DC | PRN
  Administered 2020-07-02: 60 mg via INTRATRACHEAL

## 2020-07-02 MED ORDER — SODIUM CHLORIDE 0.9 % IV SOLN: INTRAVENOUS | Status: DC | PRN

## 2020-07-02 MED ORDER — PROPOFOL 10 MG/ML IV BOLUS
INTRAVENOUS | Status: DC | PRN
  Administered 2020-07-02: 50 mg via INTRAVENOUS

## 2020-07-02 NOTE — CV Procedure (Signed)
    Cardioversion Note  Tasha [name] [id number] April 25, 1940  Procedure: DC Cardioversion Indications: atrial fib   Procedure Details Consent: Obtained Time Out: Verified patient identification, verified procedure, site/side was marked, verified correct patient position, special equipment/implants available, Radiology Safety Procedures followed,  medications/allergies/relevent history reviewed, required imaging and test results available.  Performed  The patient has been on adequate anticoagulation.  The patient received IV Lidocaine 60 mg IV followed by Propofol 50 mg IV  for sedation.  Synchronous cardioversion was performed at  200  joules.  The cardioversion was successful.     Complications: No apparent complications Patient did tolerate procedure well.   Thayer Headings, Brooke Bonito., MD, [facility] 07/02/2020, 9:15 AM

## 2020-07-02 NOTE — Discharge Instructions (Signed)

## 2020-07-02 NOTE — Transfer of Care (Signed)
Immediate Anesthesia Transfer of Care Note  Patient: Tasha [name]  Procedure(s) Performed: CARDIOVERSION  Patient Location: Endoscopy Unit  Anesthesia Type:General  Level of Consciousness: drowsy  Airway & Oxygen Therapy: Patient Spontanous Breathing  Post-op Assessment: Report given to RN and Post -op Vital signs reviewed and stable  Post vital signs: Reviewed and stable  Last Vitals:  Vitals Value Taken Time  BP 114/46 0916  Temp    Pulse 64 0916  Resp 20 0916  SpO2 98 0916    Last Pain:  Vitals:   07/02/20 0835  TempSrc: Temporal  PainSc: 0-No pain         Complications: No notable events documented.

## 2020-07-02 NOTE — Anesthesia Postprocedure Evaluation (Signed)
Anesthesia Post Note  Patient: Tasha [name]  Procedure(s) Performed: CARDIOVERSION     Patient location during evaluation: PACU Anesthesia Type: General Level of consciousness: awake and alert, oriented and patient cooperative Pain management: pain level controlled Vital Signs Assessment: post-procedure vital signs reviewed and stable Respiratory status: spontaneous breathing, nonlabored ventilation and respiratory function stable Cardiovascular status: blood pressure returned to baseline and stable Postop Assessment: no apparent nausea or vomiting Anesthetic complications: no   No notable events documented.  Last Vitals:  Vitals:   07/02/20 0918 07/02/20 0930  BP: (!) 102/45 (!) 109/45  Pulse: 63 63  Resp: (!) 22 19  Temp:    SpO2: 97% 98%    Last Pain:  Vitals:   07/02/20 0930  TempSrc:   PainSc: 0-No pain                 [name]

## 2020-07-02 NOTE — Interval H&P Note (Signed)
History and Physical Interval Note:  07/02/2020 8:56 AM  Tasha [name]  has presented today for surgery, with the diagnosis of AFIB.  The various methods of treatment have been discussed with the patient and family. After consideration of risks, benefits and other options for treatment, the patient has consented to  Procedure(s): CARDIOVERSION (N/A) as a surgical intervention.  The patient's history has been reviewed, patient examined, no change in status, stable for surgery.  I have reviewed the patient's chart and labs.  Questions were answered to the patient's satisfaction.     Mertie Moores

## 2020-07-02 NOTE — Anesthesia Procedure Notes (Signed)
Procedure Name: General with mask airway Date/Time: 07/02/2020 9:10 AM Performed by: Colin Benton, CRNA Pre-anesthesia Checklist: Patient identified, Emergency Drugs available, Suction available and Patient being monitored Patient Re-evaluated:Patient Re-evaluated prior to induction Oxygen Delivery Method: Ambu bag Preoxygenation: Pre-oxygenation with 100% oxygen Induction Type: IV induction Ventilation: Mask ventilation without difficulty Placement Confirmation: positive ETCO2 Dental Injury: Teeth and Oropharynx as per pre-operative assessment

## 2020-07-02 NOTE — Anesthesia Preprocedure Evaluation (Signed)
Anesthesia Evaluation  Patient identified by MRN, date of birth, ID band Patient awake    Reviewed: Allergy & Precautions, NPO status , Patient's Chart, lab work & pertinent test results  Airway Mallampati: III  TM Distance: >3 FB Neck ROM: Full  Mouth opening: Limited Mouth Opening  Dental no notable dental hx. (+) Teeth Intact, Dental Advisory Given   Pulmonary  COVID feb-march 2022, some residual weakness   Pulmonary exam normal breath sounds clear to auscultation       Cardiovascular Normal cardiovascular exam+ dysrhythmias Atrial Fibrillation  Rhythm:Regular Rate:Normal  Echo 03/2020: 1. Left ventricular ejection fraction, by estimation, is 65 to 70%. The  left ventricle has normal function. The left ventricle has no regional  wall motion abnormalities. Left ventricular diastolic function could not  be evaluated.  2. Right ventricular systolic function is mildly reduced. The right  ventricular size is mildly enlarged. There is mildly elevated pulmonary  artery systolic pressure. The estimated right ventricular systolic  pressure is 29.9 mmHg.  3. Left atrial size was mildly dilated.  4. Right atrial size was mildly dilated.  5. The mitral valve was not assessed. No evidence of mitral valve  regurgitation. No evidence of mitral stenosis.  6. The aortic valve was not assessed. Aortic valve regurgitation is not  visualized. No aortic stenosis is present.  7. The inferior vena cava is normal in size with greater than 50%  respiratory variability, suggesting right atrial pressure of 3 mmHg.    Neuro/Psych  Neuromuscular disease (MS) negative psych ROS   GI/Hepatic negative GI ROS, Neg liver ROS,   Endo/Other  Hyperthyroidism   Renal/GU negative Renal ROS  negative genitourinary   Musculoskeletal negative musculoskeletal ROS (+)   Abdominal   Peds negative pediatric ROS (+)  Hematology Hx CLL plt 94    Anesthesia Other Findings   Reproductive/Obstetrics negative OB ROS                             Anesthesia Physical Anesthesia Plan  ASA: 3  Anesthesia Plan: General   Post-op Pain Management:    Induction: Intravenous  PONV Risk Score and Plan: TIVA and Treatment may vary due to age or medical condition  Airway Management Planned: Natural Airway and Mask  Additional Equipment: None  Intra-op Plan:   Post-operative Plan:   Informed Consent: I have reviewed the patients History and Physical, chart, labs and discussed the procedure including the risks, benefits and alternatives for the proposed anesthesia with the patient or authorized representative who has indicated his/her understanding and acceptance.       Plan Discussed with: CRNA  Anesthesia Plan Comments:         Anesthesia Quick Evaluation

## 2020-07-04 ENCOUNTER — Other Ambulatory Visit: Payer: Self-pay

## 2020-07-04 ENCOUNTER — Inpatient Hospital Stay: Payer: Medicare Other | Attending: Oncology

## 2020-07-04 ENCOUNTER — Encounter (HOSPITAL_COMMUNITY): Payer: Self-pay | Admitting: Cardiovascular Disease

## 2020-07-04 DIAGNOSIS — D696 Thrombocytopenia, unspecified: Secondary | ICD-10-CM | POA: Diagnosis not present

## 2020-07-04 DIAGNOSIS — C9110 Chronic lymphocytic leukemia of B-cell type not having achieved remission: Secondary | ICD-10-CM | POA: Diagnosis not present

## 2020-07-04 DIAGNOSIS — Z809 Family history of malignant neoplasm, unspecified: Secondary | ICD-10-CM | POA: Diagnosis not present

## 2020-07-04 DIAGNOSIS — Z90710 Acquired absence of both cervix and uterus: Secondary | ICD-10-CM | POA: Insufficient documentation

## 2020-07-04 DIAGNOSIS — D801 Nonfamilial hypogammaglobulinemia: Secondary | ICD-10-CM | POA: Insufficient documentation

## 2020-07-04 DIAGNOSIS — Z79899 Other long term (current) drug therapy: Secondary | ICD-10-CM | POA: Diagnosis not present

## 2020-07-04 LAB — CBC WITH DIFFERENTIAL/PLATELET
Abs Immature Granulocytes: 0.01 10*3/uL (ref 0.00–0.07)
Basophils Absolute: 0 10*3/uL (ref 0.0–0.1)
Basophils Relative: 1 %
Eosinophils Absolute: 0.1 10*3/uL (ref 0.0–0.5)
Eosinophils Relative: 4 %
HCT: 43.3 % (ref 36.0–46.0)
Hemoglobin: 13.8 g/dL (ref 12.0–15.0)
Immature Granulocytes: 0 %
Lymphocytes Relative: 41 %
Lymphs Abs: 1.4 10*3/uL (ref 0.7–4.0)
MCH: 26.7 pg (ref 26.0–34.0)
MCHC: 31.9 g/dL (ref 30.0–36.0)
MCV: 83.9 fL (ref 80.0–100.0)
Monocytes Absolute: 0.4 10*3/uL (ref 0.1–1.0)
Monocytes Relative: 12 %
Neutro Abs: 1.4 10*3/uL — ABNORMAL LOW (ref 1.7–7.7)
Neutrophils Relative %: 42 %
Platelets: 90 10*3/uL — ABNORMAL LOW (ref 150–400)
RBC: 5.16 MIL/uL — ABNORMAL HIGH (ref 3.87–5.11)
RDW: 14.5 % (ref 11.5–15.5)
WBC: 3.4 10*3/uL — ABNORMAL LOW (ref 4.0–10.5)
nRBC: 0 % (ref 0.0–0.2)

## 2020-07-04 LAB — COMPREHENSIVE METABOLIC PANEL
ALT: 9 U/L (ref 0–44)
AST: 13 U/L — ABNORMAL LOW (ref 15–41)
Albumin: 3.6 g/dL (ref 3.5–5.0)
Alkaline Phosphatase: 73 U/L (ref 38–126)
Anion gap: 9 (ref 5–15)
BUN: 13 mg/dL (ref 8–23)
CO2: 25 mmol/L (ref 22–32)
Calcium: 9 mg/dL (ref 8.9–10.3)
Chloride: 109 mmol/L (ref 98–111)
Creatinine, Ser: 0.74 mg/dL (ref 0.44–1.00)
GFR, Estimated: >60 mL/min (ref >60)
Glucose, Bld: 144 mg/dL — ABNORMAL HIGH (ref 70–99)
Potassium: 4.3 mmol/L (ref 3.5–5.1)
Sodium: 143 mmol/L (ref 135–145)
Total Bilirubin: 0.6 mg/dL (ref 0.3–1.2)
Total Protein: 6.2 g/dL — ABNORMAL LOW (ref 6.5–8.1)

## 2020-07-04 LAB — LACTATE DEHYDROGENASE: LDH: 255 U/L — ABNORMAL HIGH (ref 98–192)

## 2020-07-04 LAB — SAVE SMEAR(SSMR), FOR PROVIDER SLIDE REVIEW

## 2020-07-05 LAB — THYROID PANEL WITH TSH
Free Thyroxine Index: 1.3 (ref 1.2–4.9)
T3 Uptake Ratio: 21 % — ABNORMAL LOW (ref 24–39)
T4, Total: 6.3 ug/dL (ref 4.5–12.0)
TSH: 2.19 u[IU]/mL (ref 0.450–4.500)

## 2020-07-05 LAB — IGG, IGA, IGM
IgA: 34 mg/dL — ABNORMAL LOW (ref 64–422)
IgG (Immunoglobin G), Serum: 765 mg/dL (ref 586–1602)
IgM (Immunoglobulin M), Srm: 8 mg/dL — ABNORMAL LOW (ref 26–217)

## 2020-07-05 LAB — BETA 2 MICROGLOBULIN, SERUM: Beta-2 Microglobulin: 4.1 mg/L — ABNORMAL HIGH (ref 0.6–2.4)

## 2020-07-09 NOTE — Progress Notes (Signed)
Tasha [name]  [phone] [phone]     ID: Tasha [name] DOB: [birth year]  MR#: [id number]  WVP#:[id number]  [name] Care Team: Lavone Orn, MD as PCP - General (Internal Medicine) Sanda Klein, MD as PCP - Cardiology (Cardiology) Croitoru, Dani Gobble, MD as Consulting Physician (Cardiology) Mariha Sleeper, Virgie Dad, MD as Consulting Physician (Oncology) Sabas Sous, MD as Referring Physician (Internal Medicine) OTHER MD:  CHIEF COMPLAINT: Chronic Lymphoid Leukemia  CURRENT TREATMENT: IVIG, evusheld   INTERVAL HISTORY: Tasha [name] returns today for follow up of her chronic lymphoid leukemia. She continues under observation.   She received IVIG in May.  She tells me she had a reaction and that she always has a reaction to almost anything.  She did not receive evusheld at that time   REVIEW OF SYSTEMS: Tasha [name] tells me Tasha most recent cardioversion actually worked and as a result she has a little bit more energy.  She does pretty much everything in Tasha house since she says her husband after his heart issues last year sits all day watching TV or sleeps.  He does not help her.  Her son and is in and out of Tasha grandsons life and that is a major source of concern to her.  According to what she tells me Tasha boy who is now 72 is doing very well in school and is very interested in science.   COVID 19 VACCINATION STATUS: Had COVID March 2022.   HISTORY OF CURRENT ILLNESS: Tasha [name] has been following up with Dr. Sabas Sous at [facility]. From Dr. Tawanna Sat 03/23/2017 note:  Tasha [name] is a 80 y.o. [place] woman with chronic lymphocytic leukemia diagnosed in 2001, Rai stage 0. She has been followed at [facility] by Dr. Sabas Sous at [facility], who is retiring (Tasha end of an era).  Dr. Laurance Flatten was seeing Tasha [name] on a once a year basis.  She has not required treatment to date.  Most recent labs obtained at [facility] 03/22/2017, showed a hemoglobin of 13.8, MCV 88, white cell  count 6.1, platelets 101,000, unchanged since at least August 2007); with IgA 51, IgG 421, and IgM 11.  (Tasha IgG has dropped from 642 August 2010 to 421 March 2019).  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  Beta-2 microglobulin obtained 11/30/2000 was not elevated at 1.9.  Sed rate on Tasha same date was 4  Tasha [name]'s subsequent history is as detailed below.   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   IFG (impaired fasting glucose)    e   Personal history of other diseases of circulatory system    Tick bite    Unspecified hereditary and idiopathic peripheral neuropathy    Vitamin B12 deficiency    Vitamin D deficiency   Charcoid-Marie's tooth Disease   PAST SURGICAL HISTORY: Past Surgical History:  Procedure Laterality Date   ABDOMINAL HYSTERECTOMY  1984  fibroids   BREAST SURGERY  1960s   benign breast tumors   CARDIOVERSION N/A 05/15/2020   Procedure: CARDIOVERSION;  Surgeon: Sanda Klein, MD;  Location: [facility];  Service: Cardiovascular;  Laterality: N/A;   CARDIOVERSION N/A 07/02/2020   Procedure: CARDIOVERSION;  Surgeon: Acie Fredrickson Wonda Cheng, MD;  Location: [facility] ENDOSCOPY;  Service: Cardiovascular;  Laterality: N/A;   CATARACT EXTRACTION W/ INTRAOCULAR LENS  IMPLANT, BILATERAL     LAPAROSCOPIC BILATERAL SALPINGO OOPHERECTOMY  2003   ovarian cyst  Tonsillectomy as a child.    FAMILY  HISTORY Family History  Problem Relation Age of Onset   Hypertension Mother    Coronary artery disease Father        died at 57   Hypertension Father    Diabetes Father    Coronary artery disease Brother        died at 87   Diabetes Brother    Hypertension Brother    Cancer Daughter   Tasha [name]'s mother died at age 42 due to stroke and neurological problems similar to Tasha [name]'s. Tasha [name]'s father died at age 63 due to heart problems and diabetes. Tasha [name] had 1 brother who died with heart problems. Tasha [name] has 2 sisters, one of whom has diabetes. There was a maternal grandfather with prostate cancer.  Tasha [name] denies any other cancers such as lymphoma or myeloma in Tasha family.    GYNECOLOGIC HISTORY:  No LMP recorded. [name] has had a hysterectomy. Menarche: 80 years old Age at first live birth: 80 years old She is GXP3.  LMP: That is post hysterectomy in 1984 fibroids. She used HRT for less than 2 years with no complications.    SOCIAL HISTORY: (Updated August 2021) Tasha [name] retired in 2001 due to her CLL. She worked at [employer] in [employer]. She and her husband, [name] have been married for 77 years as of August 2020. [name] used to work for [employer], and also has been a Psychologist, occupational at [employer]. Tasha [name]'s oldest daughter, [name], worked at [employer] in Tasha billing department but her job was terminated 2020 and she is now working for [employer]. Tasha [name]'s second daughter is [name], who is a principal at [employer]. Tasha [name]'s son, [name], is an estimator for home remodeling... At home, is Tasha [name], her husband, and her grandson, 69 y/o Tasha [name] ([name]'s son), who is doing very well academically and has a strong interest in math. Tasha [name]'s mother is "not in his life". Tasha [name] attends [employer].      HEALTH MAINTENANCE: Social History   Tobacco Use   Smoking status: Never   Smokeless  tobacco: Never  Substance Use Topics   Alcohol use: No    Alcohol/week: 0.0 standard drinks   Drug use: No     Colonoscopy: refuses   PAP: s/p hysterectomy  Bone density: 2018 was normal  Mammography: "no more" after 2017 per her PCP   Allergies  Allergen Reactions   Clindamycin Itching and Rash   Celebrex [Celecoxib] Other (See Comments)    Hypertension, possible TIA   Statins Other (See Comments)    Muscle aches   Tape     [name] CAN tolerate Coban Wrap only (NO TAPE!!)   Tricor [Fenofibrate] Other (See Comments)    Elevated liver enzymes   Amoxicillin Rash   Clarithromycin Rash   Latex Rash   Methimazole Rash    Current Outpatient Medications  Medication Sig Dispense Refill  acetaminophen (TYLENOL) 500 MG tablet Take 500-1,000 mg by mouth 2 (two) times daily as needed (pain.).     apixaban (ELIQUIS) 5 MG TABS tablet TAKE 1 TABLET(5 MG) BY MOUTH TWICE DAILY ([name] taking differently: Take 5 mg by mouth 2 (two) times daily. (0800 & 2000)) 180 tablet 1   digoxin (LANOXIN) 0.125 MG tablet Take 1 tablet (0.125 mg total) by mouth daily. 30 tablet 11   flecainide (TAMBOCOR) 50 MG tablet TAKE 1 TABLET(50 MG) BY MOUTH TWICE DAILY ([name] taking differently: Take 50 mg by mouth 2 (two) times daily.) 60 tablet 11   furosemide (LASIX) 40 MG tablet Take 1 tablet (40 mg total) by mouth daily. Do not take if your weight is less than 195 lbs ([name] taking differently: Take 40 mg by mouth See admin instructions. Do not take if your weight is less than 195 lbs) 30 tablet 5   metoprolol tartrate (LOPRESSOR) 100 MG tablet Take 1 tablet (100 mg total) by mouth 2 (two) times daily. ([name] taking differently: Take 100 mg by mouth 2 (two) times daily. (0800 & 2000)) 60 tablet 2   Polyethyl Glycol-Propyl Glycol (SYSTANE) 0.4-0.3 % SOLN Place 1-2 drops into both eyes in Tasha morning and at bedtime.     potassium chloride SA (KLOR-CON M20) 20 MEQ tablet Take 1 tablet (20 mEq total) by mouth See  admin instructions. Do not take if your weight is less than 195 lbs 90 tablet 3   No current facility-administered medications for this visit.    OBJECTIVE: White woman using a Rollator Vitals:   07/10/20 1228  BP: (!) 123/52  Pulse: 65  Resp: 18  Temp: (!) 97.5 F (36.4 C)  SpO2: 98%     Body mass index is 32.6 kg/m.   Wt Readings from Last 3 Encounters:  07/10/20 202 lb (91.6 kg)  07/02/20 198 lb 13.7 oz (90.2 kg)  07/01/20 199 lb (90.3 kg)  ECOG FS:1 - Symptomatic but completely ambulatory  Sclerae unicteric, EOMs intact Wearing a mask No cervical or supraclavicular adenopathy Lungs no rales or rhonchi Heart regular rate and rhythm Abd soft, nontender, positive bowel sounds MSK no focal spinal tenderness Neuro: nonfocal, well oriented, appropriate affect Breasts: Deferred   LAB RESULTS:  CMP     Component Value Date/Time   NA 143 07/04/2020 1011   NA 141 06/27/2020 1041   K 4.3 07/04/2020 1011   CL 109 07/04/2020 1011   CO2 25 07/04/2020 1011   GLUCOSE 144 (H) 07/04/2020 1011   BUN 13 07/04/2020 1011   BUN 15 06/27/2020 1041   CREATININE 0.74 07/04/2020 1011   CREATININE 0.74 01/31/2018 1358   CALCIUM 9.0 07/04/2020 1011   PROT 6.2 (L) 07/04/2020 1011   ALBUMIN 3.6 07/04/2020 1011   AST 13 (L) 07/04/2020 1011   AST 11 (L) 01/31/2018 1358   ALT 9 07/04/2020 1011   ALT 13 01/31/2018 1358   ALKPHOS 73 07/04/2020 1011   BILITOT 0.6 07/04/2020 1011   BILITOT 0.6 01/31/2018 1358   GFRNONAA >60 07/04/2020 1011   GFRNONAA >60 01/31/2018 1358   GFRAA >60 09/12/2019 0853   GFRAA >60 01/31/2018 1358    No results found for: TOTALPROTELP, ALBUMINELP, A1GS, A2GS, BETS, BETA2SER, GAMS, MSPIKE, SPEI  No results found for: KPAFRELGTCHN, LAMBDASER, KAPLAMBRATIO  Lab Results  Component Value Date   WBC 3.4 (L) 07/04/2020   NEUTROABS 1.4 (L) 07/04/2020   HGB 13.8 07/04/2020   HCT 43.3 07/04/2020   MCV 83.9  07/04/2020   PLT 90 (L) 07/04/2020   No results  found for: LABCA2  No components found for: [id number]  No results for input(s): INR in Tasha last 168 hours.  No results found for: LABCA2  No results found for: UDJ497  No results found for: WYO378  No results found for: HYI502  No results found for: CA2729  No components found for: HGQUANT  No results found for: CEA1 / No results found for: CEA1   No results found for: AFPTUMOR  No results found for: CHROMOGRNA  No results found for: HGBA, HGBA2QUANT, HGBFQUANT, HGBSQUAN (Hemoglobinopathy evaluation)   Lab Results  Component Value Date   LDH 255 (H) 07/04/2020    No results found for: IRON, TIBC, IRONPCTSAT (Iron and TIBC)  No results found for: FERRITIN  Urinalysis    Component Value Date/Time   COLORURINE AMBER (A) 04/05/2020 0606   APPEARANCEUR HAZY (A) 04/05/2020 0606   LABSPEC 1.018 04/05/2020 0606   PHURINE 5.0 04/05/2020 0606   GLUCOSEU NEGATIVE 04/05/2020 0606   HGBUR SMALL (A) 04/05/2020 0606   BILIRUBINUR NEGATIVE 04/05/2020 0606   KETONESUR 20 (A) 04/05/2020 0606   PROTEINUR 100 (A) 04/05/2020 0606   UROBILINOGEN 0.2 05/13/2009 0821   NITRITE NEGATIVE 04/05/2020 0606   LEUKOCYTESUR NEGATIVE 04/05/2020 0606    STUDIES: No results found.   ELIGIBLE FOR AVAILABLE RESEARCH PROTOCOL: no  ASSESSMENT: 80 y.o. 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  (a) IVIG supplementation started May 2022  (b) evusheld pending   PLAN: Tasha [name] is now 21 years out from initial diagnosis of chronic lymphoid leukemia.  Her disease is very stable and does not require specific  treatment at this point.  She does require supportive care and I am setting her up for a repeat IVIG infusion in September.  I had written for evusheld for her but she did not think she wanted to do that because she always gets a reaction to treatments.  We discussed Tasha fact that she likely is not making any antibodies at all despite having had COVID and being vaccinated.  She needs a passive vaccine and that should be repeated every 6 months.  She will see me again in September (she did not want to return before then because she has a lot of canning to do she says) and she will receive IVIG and NebuSal at that time  Total encounter time 20 minutes.Sarajane Jews C. Ellery Meroney, MD  07/10/20 1:13 PM Medical Oncology and Hematology [facility] [place], [place] [address] Tel. [phone]    Fax. [phone]   I, Wilburn Mylar, am acting as scribe for Dr. Virgie Dad. Janea Schwenn.  I, Lurline Del MD, have reviewed Tasha above documentation for accuracy and completeness, and I agree with Tasha above.   *Total Encounter Time as defined by T[facility] for Medicare and Medicaid Services includes, in addition to Tasha face-to-face time of a [name] visit (documented in Tasha note above) non-face-to-face time: obtaining and reviewing outside history, ordering and reviewing medications, tests or procedures, care coordination (communications with other health care professionals or caregivers) and documentation in  Tasha medical record.

## 2020-07-10 ENCOUNTER — Other Ambulatory Visit: Payer: Self-pay

## 2020-07-10 ENCOUNTER — Inpatient Hospital Stay (HOSPITAL_BASED_OUTPATIENT_CLINIC_OR_DEPARTMENT_OTHER): Payer: Medicare Other | Admitting: Oncology

## 2020-07-10 ENCOUNTER — Other Ambulatory Visit: Payer: Medicare Other

## 2020-07-10 VITALS — BP 123/52 | HR 65 | Temp 97.5°F | Resp 18 | Ht 66.0 in | Wt 202.0 lb

## 2020-07-10 DIAGNOSIS — D849 Immunodeficiency, unspecified: Secondary | ICD-10-CM | POA: Diagnosis not present

## 2020-07-10 DIAGNOSIS — C9110 Chronic lymphocytic leukemia of B-cell type not having achieved remission: Secondary | ICD-10-CM | POA: Diagnosis not present

## 2020-07-10 DIAGNOSIS — Z90710 Acquired absence of both cervix and uterus: Secondary | ICD-10-CM | POA: Diagnosis not present

## 2020-07-10 DIAGNOSIS — Z809 Family history of malignant neoplasm, unspecified: Secondary | ICD-10-CM | POA: Diagnosis not present

## 2020-07-10 DIAGNOSIS — Z79899 Other long term (current) drug therapy: Secondary | ICD-10-CM | POA: Diagnosis not present

## 2020-07-10 DIAGNOSIS — D801 Nonfamilial hypogammaglobulinemia: Secondary | ICD-10-CM | POA: Diagnosis not present

## 2020-07-10 DIAGNOSIS — D696 Thrombocytopenia, unspecified: Secondary | ICD-10-CM | POA: Diagnosis not present

## 2020-07-11 ENCOUNTER — Ambulatory Visit (HOSPITAL_COMMUNITY): Payer: Medicare Other | Admitting: Nurse Practitioner

## 2020-07-15 ENCOUNTER — Telehealth: Payer: Self-pay | Admitting: Cardiovascular Disease

## 2020-07-15 NOTE — Telephone Encounter (Signed)
Pt is calling in to check if she is supposed to be taking the same meds since her Cardioversion. Please advise pt further

## 2020-07-15 NOTE — Telephone Encounter (Signed)
Pt is returning call to [facility] from earlier this morning. Please advise

## 2020-07-15 NOTE — Telephone Encounter (Signed)
Left message to call back  

## 2020-07-15 NOTE — Telephone Encounter (Signed)
Spoke with pt and advised that per chart review, no changes were made to her medication post cardioversion. Pt verbalized understanding to continue with current medication regimen.

## 2020-07-16 ENCOUNTER — Other Ambulatory Visit: Payer: Self-pay | Admitting: Adult Health

## 2020-07-16 NOTE — Progress Notes (Signed)
error 

## 2020-07-25 ENCOUNTER — Other Ambulatory Visit: Payer: Self-pay

## 2020-07-25 ENCOUNTER — Ambulatory Visit (HOSPITAL_COMMUNITY)
Admission: RE | Admit: 2020-07-25 | Discharge: 2020-07-25 | Disposition: A | Discharge status: Home / Self Care | Payer: Medicare Other | Source: Ambulatory Visit | Attending: Nurse Practitioner | Admitting: Nurse Practitioner

## 2020-07-25 ENCOUNTER — Encounter (HOSPITAL_COMMUNITY): Payer: Self-pay | Admitting: Nurse Practitioner

## 2020-07-25 VITALS — BP 94/48 | HR 68 | Ht 66.0 in | Wt 201.4 lb

## 2020-07-25 DIAGNOSIS — Z8249 Family history of ischemic heart disease and other diseases of the circulatory system: Secondary | ICD-10-CM | POA: Diagnosis not present

## 2020-07-25 DIAGNOSIS — D6869 Other thrombophilia: Secondary | ICD-10-CM | POA: Diagnosis not present

## 2020-07-25 DIAGNOSIS — I4819 Other persistent atrial fibrillation: Secondary | ICD-10-CM

## 2020-07-25 DIAGNOSIS — Z7901 Long term (current) use of anticoagulants: Secondary | ICD-10-CM | POA: Insufficient documentation

## 2020-07-25 DIAGNOSIS — Z8616 Personal history of COVID-19: Secondary | ICD-10-CM | POA: Insufficient documentation

## 2020-07-25 DIAGNOSIS — Z79899 Other long term (current) drug therapy: Secondary | ICD-10-CM | POA: Diagnosis not present

## 2020-07-25 DIAGNOSIS — G35 Multiple sclerosis: Secondary | ICD-10-CM | POA: Insufficient documentation

## 2020-07-25 NOTE — Patient Instructions (Signed)
Stop flecainide 

## 2020-07-25 NOTE — Progress Notes (Signed)
Primary Care Physician: Lavone Orn, MD Referring Physician: Dr. Lottie Rater is a 80 y.o. female with a h/o of multiple sclerosis, CLL, PAF-who presented  to [facility] March 2022, with weakness and diarrhea-she was found to have COVID-19 infection as well as pnuemonia.    Hospital course complicated by A. fib with RVR-she was also found to have new onset hyperthyroidism. She was started on metoprolol and digoxin and rate well controlled.  She was seen by Dr. Loletha Grayer on f/u visit 04/29/20. She was set up for cardioversion  05/15/20 which was not successful. She was started on flecainide 50 mg bid and then set up for cardioversion again 07/02/20. This CV was successful but then had ERAF as she is back in afib today. Pt was unaware. She felt she was in rhythm and states she sees HR"s in the 60-80 range at home.   Discussed with pt AAD therapy, amiodarone or Tikosyn, as iut appers she has failed flecainide. I would be concerned to start amiodarone with recent thyroid issues. She does not like the hospital stay with tikosyn and for now feels good and would like to continue with rate control. I will stop flecainde today, continue with current rate control.  Today, she denies symptoms of palpitations, chest pain, shortness of breath, orthopnea, PND, lower extremity edema, dizziness, presyncope, syncope, or neurologic sequela. The patient is tolerating medications without difficulties and is otherwise without complaint today.   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   IFG (impaired fasting glucose)    e   Personal history of other diseases of circulatory system    Tick bite    Unspecified  hereditary and idiopathic peripheral neuropathy    Vitamin B12 deficiency    Vitamin D deficiency    Past Surgical History:  Procedure Laterality Date   ABDOMINAL HYSTERECTOMY  1984   fibroids   BREAST SURGERY  1960s   benign breast tumors   CARDIOVERSION N/A 05/15/2020   Procedure: CARDIOVERSION;  Surgeon: Sanda Klein, MD;  Location: [facility];  Service: Cardiovascular;  Laterality: N/A;   CARDIOVERSION N/A 07/02/2020   Procedure: CARDIOVERSION;  Surgeon: Thayer Headings, MD;  Location[facility];  Service: Cardiovascular;  Laterality: N/A;   CATARACT EXTRACTION W/ INTRAOCULAR LENS  IMPLANT, BILATERAL     LAPAROSCOPIC BILATERAL SALPINGO OOPHERECTOMY  2003   ovarian cyst    Current Outpatient Medications  Medication Sig Dispense Refill   acetaminophen (TYLENOL) 500 MG tablet Take 500-1,000 mg by mouth 2 (two) times daily as needed (pain.).     apixaban (ELIQUIS) 5 MG TABS tablet TAKE 1 TABLET(5 MG) BY MOUTH TWICE DAILY (Patient taking differently: Take 5 mg by mouth 2 (two) times daily. (0800 & 2000)) 180 tablet 1   digoxin (LANOXIN) 0.125 MG tablet Take 1 tablet (0.125 mg total) by mouth daily. 30 tablet 11   furosemide (LASIX) 40 MG tablet Take 1 tablet (40 mg total) by mouth daily. Do not take if your weight is less than 195 lbs (Patient taking differently: Take 40 mg by mouth See admin instructions. Do not take if your weight is less than 195  lbs) 30 tablet 5   metoprolol tartrate (LOPRESSOR) 100 MG tablet Take 1 tablet (100 mg total) by mouth 2 (two) times daily. (Patient taking differently: Take 100 mg by mouth 2 (two) times daily. (0800 & 2000)) 60 tablet 2   Polyethyl Glycol-Propyl Glycol (SYSTANE) 0.4-0.3 % SOLN Place 1-2 drops into both eyes in the morning and at bedtime.     potassium chloride SA (KLOR-CON M20) 20 MEQ tablet Take 1 tablet (20 mEq total) by mouth See admin instructions. Do not take if your weight is less than 195 lbs 90 tablet 3   No current  facility-administered medications for this encounter.    Allergies  Allergen Reactions   Clindamycin Itching and Rash   Celebrex [Celecoxib] Other (See Comments)    Hypertension, possible TIA   Statins Other (See Comments)    Muscle aches   Tape     Patient CAN tolerate [place] Wrap only (NO TAPE!!)   Tricor [Fenofibrate] Other (See Comments)    Elevated liver enzymes   Amoxicillin Rash   Clarithromycin Rash   Latex Rash   Methimazole Rash    Social History   Socioeconomic History   Marital status: Married    Spouse name: Designer, industrial/product   Number of children: 3   Years of education: college   Highest education level: Not on file  Occupational History    Comment: Retired  Tobacco Use   Smoking status: Never   Smokeless tobacco: Never  Substance and Sexual Activity   Alcohol use: No    Alcohol/week: 0.0 standard drinks   Drug use: No   Sexual activity: Not on file  Other Topics Concern   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 Patient has college education.    Right handed.   Caffeine- not every day . Soda or tea when she is out for dinner.   Social Determinants of Health   Financial Resource Strain: Not on file  Food Insecurity: Not on file  Transportation Needs: Not on file  Physical Activity: Not on file  Stress: Not on file  Social Connections: Not on file  Intimate Partner Violence: Not on file    Family History  Problem Relation Age of Onset   Hypertension Mother    Coronary artery disease Father        died at 67   Hypertension Father    Diabetes Father    Coronary artery disease Brother        died at 31   Diabetes Brother    Hypertension Brother    Cancer Daughter     ROS- All systems are reviewed and negative except as per the HPI above  Physical Exam: Vitals:   07/25/20 0901  BP: (!) 94/48  Pulse: 68  Weight: 91.4 kg  Height: 5\' 6"  (1.676 m)   Wt Readings from Last 3 Encounters:  07/25/20  91.4 kg  07/10/20 91.6 kg  07/02/20 90.2 kg    Labs: Lab Results  Component Value Date   NA 143 07/04/2020   K 4.3 07/04/2020   CL 109 07/04/2020   CO2 25 07/04/2020   GLUCOSE 144 (H) 07/04/2020   BUN 13 07/04/2020   CREATININE 0.74 07/04/2020   CALCIUM 9.0 07/04/2020   PHOS 3.4 04/10/2020   MG 2.3 04/10/2020   No results found for: INR No results found for: CHOL, HDL, LDLCALC, TRIG   GEN- The patient is well appearing, alert and  oriented x 3 today.   Head- normocephalic, atraumatic Eyes-  Sclera clear, conjunctiva pink Ears- hearing intact Oropharynx- clear Neck- supple, no JVP Lymph- no cervical lymphadenopathy Lungs- Clear to ausculation bilaterally, normal work of breathing Heart- irregular rate and rhythm, no murmurs, rubs or gallops, PMI not laterally displaced GI- soft, NT, ND, + BS Extremities- no clubbing, cyanosis, or edema MS- no significant deformity or atrophy Skin- no rash or lesion Psych- euthymic mood, full affect Neuro- strength and sensation are intact  EKG-afib at 68 bpm, qrs int 102 ms, qtc 365 ms   Echo-  1. Left ventricular ejection fraction, by estimation, is 65 to 70%. The  left ventricle has normal function. The left ventricle has no regional  wall motion abnormalities. Left ventricular diastolic function could not  be evaluated.   2. Right ventricular systolic function is mildly reduced. The right  ventricular size is mildly enlarged. There is mildly elevated pulmonary  artery systolic pressure. The estimated right ventricular systolic  pressure is 65.5 mmHg.   3. Left atrial size was mildly dilated.   4. Right atrial size was mildly dilated.   5. The mitral valve was not assessed. No evidence of mitral valve  regurgitation. No evidence of mitral stenosis.   6. The aortic valve was not assessed. Aortic valve regurgitation is not  visualized. No aortic stenosis is present.   7. The inferior vena cava is normal in size with greater than  50%  respiratory variability, suggesting right atrial pressure of 3 mmHg.   Assessment and Plan:  1. Persistent  afib  Appeared in March in the setting of  hyperthyroidism and covid /pneumonia Failed first cardioversion and ERAF after second cardioversion with flecainide on board She is in rate controlled afib today and feels well, was not aware that she was in afib  I feel that flecainide needs to be stopped as not being effective I discussed amiodarone vrs Tikosyn I fear amiodarone may flare her hyperthyroidism  She does not care for the hospital stay needed for Tikosyn She wishes at this time  to continue rate control  Continue digoxin and metoprolol at current doses   2. CHA2DS2VASc  score of at least 3 Continue eliquis 5 mg bid   F/u with Dr. Loletha Grayer as scheduled 9/7    Geroge Baseman. Aldair Rickel, [facility] [address] [place], [place] [address] [phone]    F/u with

## 2020-08-02 DIAGNOSIS — E0590 Thyrotoxicosis, unspecified without thyrotoxic crisis or storm: Secondary | ICD-10-CM | POA: Diagnosis not present

## 2020-08-02 DIAGNOSIS — I482 Chronic atrial fibrillation, unspecified: Secondary | ICD-10-CM | POA: Diagnosis not present

## 2020-08-02 DIAGNOSIS — G609 Hereditary and idiopathic neuropathy, unspecified: Secondary | ICD-10-CM | POA: Diagnosis not present

## 2020-08-06 ENCOUNTER — Other Ambulatory Visit: Payer: Self-pay | Admitting: Cardiovascular Disease

## 2020-08-16 ENCOUNTER — Emergency Department (HOSPITAL_COMMUNITY)
Admission: EM | Admit: 2020-08-16 | Discharge: 2020-08-16 | Disposition: A | Discharge status: Home / Self Care | Payer: Medicare Other | Attending: Emergency Medicine | Admitting: Emergency Medicine

## 2020-08-16 ENCOUNTER — Emergency Department (HOSPITAL_COMMUNITY): Payer: Medicare Other

## 2020-08-16 ENCOUNTER — Encounter (HOSPITAL_COMMUNITY): Payer: Self-pay

## 2020-08-16 ENCOUNTER — Other Ambulatory Visit: Payer: Self-pay

## 2020-08-16 DIAGNOSIS — S92901A Unspecified fracture of right foot, initial encounter for closed fracture: Secondary | ICD-10-CM

## 2020-08-16 DIAGNOSIS — W19XXXA Unspecified fall, initial encounter: Secondary | ICD-10-CM | POA: Diagnosis not present

## 2020-08-16 DIAGNOSIS — S92411A Displaced fracture of proximal phalanx of right great toe, initial encounter for closed fracture: Secondary | ICD-10-CM | POA: Diagnosis not present

## 2020-08-16 DIAGNOSIS — S99921A Unspecified injury of right foot, initial encounter: Secondary | ICD-10-CM | POA: Diagnosis present

## 2020-08-16 DIAGNOSIS — S0990XA Unspecified injury of head, initial encounter: Secondary | ICD-10-CM | POA: Insufficient documentation

## 2020-08-16 DIAGNOSIS — Z91040 Latex allergy status: Secondary | ICD-10-CM | POA: Diagnosis not present

## 2020-08-16 DIAGNOSIS — S92244A Nondisplaced fracture of medial cuneiform of right foot, initial encounter for closed fracture: Secondary | ICD-10-CM | POA: Diagnosis not present

## 2020-08-16 DIAGNOSIS — S92354A Nondisplaced fracture of fifth metatarsal bone, right foot, initial encounter for closed fracture: Secondary | ICD-10-CM | POA: Diagnosis not present

## 2020-08-16 DIAGNOSIS — R519 Headache, unspecified: Secondary | ICD-10-CM | POA: Diagnosis not present

## 2020-08-16 MED ORDER — ACETAMINOPHEN 500 MG PO TABS
1000.0000 mg | ORAL_TABLET | Freq: Once | ORAL | Status: AC
  Administered 2020-08-16: 1000 mg via ORAL
  Filled 2020-08-16: qty 2

## 2020-08-16 MED ORDER — ACETAMINOPHEN 500 MG PO TABS: 1000.0000 mg | ORAL_TABLET | Freq: Once | ORAL | Status: DC

## 2020-08-16 NOTE — ED Triage Notes (Signed)
Pt reports her legs gave out and and she hurt her right foot. Pt reports breaking the same foot last year. Pt also hx of MS.

## 2020-08-16 NOTE — ED Provider Notes (Signed)
[facility] DEPT Provider Note   CSN: [id number] Arrival date & time: 08/16/20  1621     History Chief Complaint  Patient presents with   Tasha [name] is a 80 y.o. female.  HPI Patient has history of MS and some instability.  She uses a walker to help with stability issues.  She reports she was standing at sink doing dishes and then she turned to get her walker and her knees gave out.  She reports that caused her to fall and twist at the same time.  She reports that she injured her right foot and has a lot of pain and swelling at the great toe and in the forefoot.  Patient reports she did strike her head although she does not have any headache.  No visual changes.  No nausea or vomiting.  Patient denies any injury to her neck, chest or abdomen.  She reports she had some pain in the center of her back but that has resolved now.  Patient does take Eliquis.    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   IFG (impaired fasting glucose)    e   Personal history of other diseases of circulatory system    Tick bite    Unspecified hereditary and idiopathic peripheral neuropathy    Vitamin B12 deficiency    Vitamin D deficiency     Patient Active Problem List   Diagnosis Date Noted   Euthyroid sick syndrome 07/01/2020   Hyperthyroidism 07/01/2020   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   Hypogammaglobulinemia ([place]) 05/23/2020   Persistent atrial fibrillation ([place])    Gastroenteritis due to 2019 novel coronavirus 04/05/2020   Sepsis ([place]) 04/05/2020   Generalized weakness 04/05/2020   Hypokalemia 04/05/2020   Hyponatremia 04/05/2020    Paroxysmal atrial fibrillation ([place]) 10/22/2017   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   Thrombocytopenia ([place]) 07/19/2017   Chronic lymphoid leukemia ([place]) 07/14/2017   Tinnitus 05/26/2016   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   Atrial tachycardia ([place]) 03/23/2013   Vitamin D deficiency    Vitamin B12 deficiency    Hereditary and idiopathic peripheral neuropathy    Gait disturbance     Past Surgical History:  Procedure Laterality Date   ABDOMINAL HYSTERECTOMY  1984   fibroids   BREAST SURGERY  1960s   benign breast tumors   CARDIOVERSION N/A 05/15/2020   Procedure: CARDIOVERSION;  Surgeon: Sanda Klein, MD;  Location: [facility];  Service: Cardiovascular;  Laterality: N/A;   CARDIOVERSION N/A 07/02/2020   Procedure: CARDIOVERSION;  Surgeon: Thayer Headings, MD;  [facility];  Service: Cardiovascular;  Laterality: N/A;   CATARACT EXTRACTION W/ INTRAOCULAR LENS  IMPLANT, BILATERAL     LAPAROSCOPIC BILATERAL SALPINGO OOPHERECTOMY  2003   ovarian cyst     OB History   No obstetric history on file.     Family History  Problem Relation Age of Onset   Hypertension Mother    Coronary artery disease Father        died at 30   Hypertension Father    Diabetes Father    Coronary artery disease Brother  died at 42   Diabetes Brother    Hypertension Brother    Cancer Daughter     Social History   Tobacco Use   Smoking status: Never   Smokeless tobacco: Never  Substance Use Topics   Alcohol use: No    Alcohol/week: 0.0 standard drinks   Drug use: No    Home Medications Prior to Admission medications   Medication Sig Start Date End Date Taking? Authorizing Provider  acetaminophen (TYLENOL) 500 MG tablet Take 500-1,000 mg by mouth 2 (two) times daily as needed (pain.).    [provider]  apixaban (ELIQUIS) 5 MG TABS tablet TAKE 1 TABLET(5 MG) BY MOUTH TWICE DAILY Patient taking differently: Take 5 mg by mouth 2 (two) times daily. (0800 & 2000) 04/29/20    Croitoru, Mihai, MD  digoxin (LANOXIN) 0.125 MG tablet Take 1 tablet (0.125 mg total) by mouth daily. 05/09/20   Croitoru, Mihai, MD  furosemide (LASIX) 40 MG tablet Take 1 tablet (40 mg total) by mouth daily. Do not take if your weight is less than 195 lbs Patient taking differently: Take 40 mg by mouth See admin instructions. Do not take if your weight is less than 195 lbs 06/13/20 09/11/20  Croitoru, Mihai, MD  metoprolol tartrate (LOPRESSOR) 100 MG tablet TAKE 1 TABLET(100 MG) BY MOUTH TWICE DAILY 08/06/20   Croitoru, Mihai, MD  Polyethyl Glycol-Propyl Glycol (SYSTANE) 0.4-0.3 % SOLN Place 1-2 drops into both eyes in the morning and at bedtime.    [provider]  potassium chloride SA (KLOR-CON M20) 20 MEQ tablet Take 1 tablet (20 mEq total) by mouth See admin instructions. Do not take if your weight is less than 195 lbs 07/01/20 09/29/20  Shamleffer, Melanie Crazier, MD    Allergies    Clindamycin, Celebrex [celecoxib], Statins, Tape, Tricor [fenofibrate], Amoxicillin, Clarithromycin, Latex, and Methimazole  Review of Systems   Review of Systems 10 systems reviewed and negative except as per HPI Physical Exam Updated Vital Signs BP 129/65 (BP Location: Right Arm)   Pulse 70   Temp 98 F (36.7 C) (Oral)   Resp 18   Wt 90.3 kg   SpO2 100%   BMI 32.13 kg/m   Physical Exam Constitutional:      Appearance: Normal appearance.  HENT:     Head: Normocephalic and atraumatic.     Mouth/Throat:     Pharynx: Oropharynx is clear.  Eyes:     Extraocular Movements: Extraocular movements intact.     Conjunctiva/sclera: Conjunctivae normal.  Neck:     Comments: No midline C-spine tenderness Cardiovascular:     Rate and Rhythm: Normal rate.  Pulmonary:     Effort: Pulmonary effort is normal.     Breath sounds: Normal breath sounds.  Chest:     Chest wall: No tenderness.  Abdominal:     General: There is no distension.     Palpations: Abdomen is soft.     Tenderness: There is  no abdominal tenderness.  Musculoskeletal:     Comments: Normal range of motion upper extremities.  Patient has moderate to large swelling and ecchymosis around the great toe on the right.  Moderate swelling of the forefoot with tenderness to palpation throughout all of the aspect of the forefoot.  Foot is warm and dry with good cap refill.  No significant pain around the [place] lie or the ankle.  No pain to compression of the calf or knee.  No effusion of the knee.  Skin:  General: Skin is warm and dry.  Neurological:     General: No focal deficit present.     Mental Status: She is alert and oriented to person, place, and time.     Motor: No weakness.     Coordination: Coordination normal.  Psychiatric:        Mood and Affect: Mood normal.    ED Results / Procedures / Treatments   Labs (all labs ordered are listed, but only abnormal results are displayed) Labs Reviewed - No data to display  EKG None  Radiology CT Head Wo Contrast  Result Date: 08/16/2020 CLINICAL DATA:  Fall, headache EXAM: CT HEAD WITHOUT CONTRAST TECHNIQUE: Contiguous axial images were obtained from the base of the skull through the vertex without intravenous contrast. COMPARISON:  05/13/2009 FINDINGS: Brain: Mild age related volume loss. No acute intracranial abnormality. Specifically, no hemorrhage, hydrocephalus, mass lesion, acute infarction, or significant intracranial injury. Vascular: No hyperdense vessel or unexpected calcification. Skull: No acute calvarial abnormality. Sinuses/Orbits: No acute findings Other: No IMPRESSION: No acute intracranial abnormality. Electronically Signed   By: Rolm Baptise M.D.   On: 08/16/2020 20:19   DG Foot Complete Right  Result Date: 08/16/2020 CLINICAL DATA:  Right foot pain after the patient's leg gave way resulting in a fall. Initial encounter. EXAM: RIGHT FOOT COMPLETE - 3+ VIEW COMPARISON:  Plain films right foot 04/05/2019. FINDINGS: Bones are osteopenic. There are  acute nondisplaced or mildly displaced fractures through the necks of the second through fifth metatarsals. Concavity at the base of the articular surface of the great toe is consistent with fracture. This fracture appears remote. No other fracture is identified. IMPRESSION: Acute nondisplaced or minimally displaced fractures of the necks of the second through fifth metatarsals. Fracture through the base of the proximal phalanx of the great toe is new since the comparison exam but appears remote. It cannot be definitively characterized. Osteopenia. Electronically Signed   By: Inge Rise M.D.   On: 08/16/2020 20:40    Procedures Procedures   Medications Ordered in ED Medications  acetaminophen (TYLENOL) tablet 1,000 mg (1,000 mg Oral Patient Refused/Not Given 08/16/20 2139)  acetaminophen (TYLENOL) tablet 1,000 mg (1,000 mg Oral Given 08/16/20 1937)    ED Course  I have reviewed the triage vital signs and the nursing notes.  Pertinent labs & imaging results that were available during my care of the patient were reviewed by me and considered in my medical decision making (see chart for details).    MDM Rules/Calculators/A&P                           Patient does have multiple fractures of the forefoot on the right these are nondisplaced.  Patient has mild to moderate swelling.  She is on anticoagulants.  CT head obtained and negative.  Patient does not have headache or confusion.  At this time stable for discharge.  Plan for significant elevating and icing and limited weightbearing.  Recommend follow-up with orthopedics.    Final Clinical Impression(s) / ED Diagnoses Final diagnoses:  Multiple closed fractures of right foot, initial encounter  Fall, initial encounter  Injury of head, initial encounter    Rx / DC Orders ED Discharge Orders     None        Charlesetta Shanks, MD 08/17/20 [phone]

## 2020-08-16 NOTE — Discharge Instructions (Addendum)
1.  Call your orthopedic doctor for recheck within the next 3 to 5 days. 2.  Elevate your foot is much as possible and apply well wrapped ice packs around it.  You may get a lot of swelling over the next couple of days.  You may also try applying wrapped Ace bandage for several hours at a time.  If you are getting a lot of swelling of your toes beyond the bandage loosen it. 3.  Use the cam walker boot when you are transferring to go to the bathroom or other activities.  Always have your walker. 4.  Take extra strength Tylenol every 6 hours for pain. 5.  Return to the emergency department if you are having new, worsening or concerning symptoms

## 2020-08-20 DIAGNOSIS — S92331A Displaced fracture of third metatarsal bone, right foot, initial encounter for closed fracture: Secondary | ICD-10-CM | POA: Diagnosis not present

## 2020-08-20 DIAGNOSIS — S92321A Displaced fracture of second metatarsal bone, right foot, initial encounter for closed fracture: Secondary | ICD-10-CM | POA: Diagnosis not present

## 2020-08-20 DIAGNOSIS — S92341A Displaced fracture of fourth metatarsal bone, right foot, initial encounter for closed fracture: Secondary | ICD-10-CM | POA: Diagnosis not present

## 2020-08-20 DIAGNOSIS — S92491A Other fracture of right great toe, initial encounter for closed fracture: Secondary | ICD-10-CM | POA: Diagnosis not present

## 2020-09-11 ENCOUNTER — Ambulatory Visit: Payer: Medicare Other | Admitting: Oncology

## 2020-09-11 ENCOUNTER — Other Ambulatory Visit: Payer: Medicare Other

## 2020-09-25 ENCOUNTER — Other Ambulatory Visit: Payer: Self-pay

## 2020-09-25 ENCOUNTER — Ambulatory Visit (INDEPENDENT_AMBULATORY_CARE_PROVIDER_SITE_OTHER): Payer: Medicare Other | Admitting: Cardiovascular Disease

## 2020-09-25 VITALS — BP 98/56 | HR 60 | Ht 67.0 in | Wt 197.0 lb

## 2020-09-25 DIAGNOSIS — R55 Syncope and collapse: Secondary | ICD-10-CM

## 2020-09-25 DIAGNOSIS — E669 Obesity, unspecified: Secondary | ICD-10-CM

## 2020-09-25 DIAGNOSIS — I4819 Other persistent atrial fibrillation: Secondary | ICD-10-CM

## 2020-09-25 DIAGNOSIS — R946 Abnormal results of thyroid function studies: Secondary | ICD-10-CM

## 2020-09-25 DIAGNOSIS — I5032 Chronic diastolic (congestive) heart failure: Secondary | ICD-10-CM

## 2020-09-25 DIAGNOSIS — R7303 Prediabetes: Secondary | ICD-10-CM | POA: Diagnosis not present

## 2020-09-25 DIAGNOSIS — C9110 Chronic lymphocytic leukemia of B-cell type not having achieved remission: Secondary | ICD-10-CM

## 2020-09-25 NOTE — Progress Notes (Signed)
Cardiology Office Note:    Date:  09/25/2020   ID:  Tasha [name], DOB 08/02/1940, MRN [id number]  PCP:  Lavone Orn, MD  Cardiologist:  Sanda Klein, MD    Referring MD: Lavone Orn, MD   Chief complaint: atrial fibrillation   History of Present Illness:    Tasha [name] is a 80 y.o. female with a hx of paroxysmal atrial tachycardia, and atrial fibrillation.  Additional medical problems include CLL with mild thrombocytopenia, multiple sclerosis with very infrequent problems, ocular migraines and idiopathic peripheral neuropathy.   She had a syncopal event in June.  She was simply standing up at the kitchen counter after washing a couple of dishes, suddenly saw bright lights and then lost consciousness.  She had serious injuries with a transmetatarsal fracture of her right foot.  She had a lot of swelling and has not yet started physical therapy.  Unclear whether she was going to need surgery.  She has a orthopedic appointment next week.  The injury has seriously limited her mobility since she also has multiple sclerosis and peripheral neuropathy and has very limited use of her left lower extremity.  She has been virtually immobile, using a bedside commode.  She has not experienced syncopal events before or since.  She struggled with paroxysmal atrial fibrillation for about 13 years, until she developed persistent atrial fibrillation after COVID-19 infection with pneumonia and transient hyperthyroidism in March 2022.  Initial cardioversion failed and she was started on flecainide.  The flecainide did cause some hallucination like side effects.  She underwent another cardioversion but had early recurrence of atrial fibrillation and subsequently decided to manage this with rate control (she was afraid of the interaction between amiodarone and her thyroid condition; did not want to do hospitalization for dofetilide).  She has been on combination metoprolol and digoxin ever since.    She had  some issues with fluid gain and shortness of breath early on, when the atrial fibrillation had poorly controlled rates.  She has not experienced problems with shortness of breath or fluid gain recently.  She has a prescription for furosemide if her weight exceeds 195 pounds but has not needed to take it in months.  She checks her heart rate at home with a pulse oximeter several times a day and has seen values between 42 and 85.  She never has tachycardia.  She is unaware of the arrhythmia.  She has not had dizziness.  Her blood pressure is running low relatively low today at 98/56.  She is not dizzy or lightheaded.  She has not had any further falls and does not have any bleeding problems.  She is compliant with Eliquis twice daily.  He was hospitalized 03 17-03 25 with Covid pneumonia and hypoxemia, despite the fact that she had received 2 vaccine doses in a booster.  She was also hyponatremic during that admission and was found to be in persistent atrial fibrillation.  Rate control was challenging and digoxin was added to metoprolol.  TSH was suppressed and free T4 was mildly elevated so she was started on methimazole and is referred to see an endocrinologist (this will not happen until June 13).  An echocardiogram during that hospitalization showed that LVEF remains normal at 65 to 70%.  The left atrium was described as mildly dilated.  There was no significant valvular abnormality detected.  She underwent cardioversion on May 15, 2020.  Multiple shocks were administered, but each time she reverted to atrial fibrillation after  just a few seconds in sinus rhythm.  She has developed edema of the lower extremities reaching up to the knees bilaterally and has shortness of breath with usual activity (NYHA functional class II).  She finds that she coughs and feels a little short of breath if she lies on her left side and feels better when she turns over onto the right.  She has not had frank orthopnea or PND.   She is compliant with anticoagulation and has not had any falls or serious bleeding problems.  She denies angina or syncope.  She is unaware of the arrhythmia.  She has noticed that her heart rate is slower now (digoxin and flecainide were both added recently).  She does not have known structural heart disease. She had a normal echo in 2013 at [place] (normal LV systolic function, mild LVH with septal wall thickness 1.2 cm, borderline left atrial dilation at 3.9 cm, 25 cm). She does not have known coronary artery problems.  Follow-up echo March 2022 showed EF 65 to 70%, mildly dilated left atrium.  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   IFG (impaired fasting glucose)    e   Personal history of other diseases of circulatory system    Tick bite    Unspecified hereditary and idiopathic peripheral neuropathy    Vitamin B12 deficiency    Vitamin D deficiency     Past Surgical History:  Procedure Laterality Date   ABDOMINAL HYSTERECTOMY  1984   fibroids   BREAST SURGERY  1960s   benign breast tumors   CARDIOVERSION N/A 05/15/2020   Procedure: CARDIOVERSION;  Surgeon: Sanda Klein, MD;  Location: [facility];  Service: Cardiovascular;  Laterality: N/A;   CARDIOVERSION N/A 07/02/2020   Procedure: CARDIOVERSION;  Surgeon: Thayer Headings, MD;  Location: [facility];  Service: Cardiovascular;  Laterality: N/A;   CATARACT EXTRACTION W/ INTRAOCULAR LENS  IMPLANT, BILATERAL     LAPAROSCOPIC BILATERAL SALPINGO OOPHERECTOMY  2003   ovarian cyst    Current Medications: Current Meds  Medication Sig   acetaminophen (TYLENOL) 500 MG tablet Take 500-1,000 mg by mouth 2 (two) times daily as  needed (pain.).   apixaban (ELIQUIS) 5 MG TABS tablet TAKE 1 TABLET(5 MG) BY MOUTH TWICE DAILY (Patient taking differently: Take 5 mg by mouth 2 (two) times daily. (0800 & 2000))   cyanocobalamin 1000 MCG tablet Take 1,000 mcg by mouth daily.   metoprolol tartrate (LOPRESSOR) 100 MG tablet TAKE 1 TABLET(100 MG) BY MOUTH TWICE DAILY   Polyethyl Glycol-Propyl Glycol (SYSTANE) 0.4-0.3 % SOLN Place 1-2 drops into both eyes in the morning and at bedtime.   potassium chloride SA (KLOR-CON M20) 20 MEQ tablet Take 1 tablet (20 mEq total) by mouth See admin instructions. Do not take if your weight is less than 195 lbs (Patient taking differently: Take 20 mEq by mouth daily as needed. Do not take if your weight is less than 195 lbs)   VITAMIN D, CHOLECALCIFEROL, PO Take 1,000 Units by mouth daily.   [DISCONTINUED] digoxin (LANOXIN) 0.125 MG tablet Take 1 tablet (0.125 mg total) by mouth daily.     Allergies:   Clindamycin, Celebrex [  celecoxib], Statins, Tape, Tricor [fenofibrate], Amoxicillin, Clarithromycin, Latex, and Methimazole   Social History   Socioeconomic History   Marital status: Married    Spouse name: Designer, industrial/product   Number of children: 3   Years of education: college   Highest education level: Not on file  Occupational History    Comment: Retired  Tobacco Use   Smoking status: Never   Smokeless tobacco: Never  Substance and Sexual Activity   Alcohol use: No    Alcohol/week: 0.0 standard drinks   Drug use: No   Sexual activity: Not on file  Other Topics Concern   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 Patient has college education.    Right handed.   Caffeine- not every day . Soda or tea when she is out for dinner.   Social Determinants of Health   Financial Resource Strain: Not on file  Food Insecurity: Not on file  Transportation Needs: Not on file  Physical Activity: Not on file  Stress: Not on file  Social Connections: Not on  file     Family History: The patient's family history includes Cancer in her daughter; Coronary artery disease in her brother and father; Diabetes in her brother and father; Hypertension in her brother, father, and mother. ROS:   Please see the history of present illness.    All other systems are reviewed and are negative EKGs/Labs/Other Studies Reviewed:   11/01/2017 echo - Left ventricle: The cavity size was normal. There was severe   concentric hypertrophy. Systolic function was vigorous. The   estimated ejection fraction was in the range of 65% to 70%. Wall   motion was normal; there were no regional wall motion   abnormalities. Doppler parameters are consistent with abnormal   left ventricular relaxation (grade 1 diastolic dysfunction). - Aortic valve: Trileaflet; mildly thickened leaflets. - Mitral valve: Moderately thickened, mildly calcified leaflets . - Right ventricle: Systolic function was normal. - Right atrium: The atrium was normal in size. - Tricuspid valve: There was mild regurgitation. - Pulmonary arteries: Systolic pressure was within the normal   range. - Pericardium, extracardiac: There was no pericardial effusion.  Echo 04/07/2018  1. Left ventricular ejection fraction, by estimation, is 65 to 70%. The  left ventricle has normal function. The left ventricle has no regional  wall motion abnormalities. Left ventricular diastolic function could not  be evaluated.   2. Right ventricular systolic function is mildly reduced. The right  ventricular size is mildly enlarged. There is mildly elevated pulmonary  artery systolic pressure. The estimated right ventricular systolic  pressure is [id number] mmHg.   3. Left atrial size was mildly dilated.   4. Right atrial size was mildly dilated.   5. The mitral valve was not assessed. No evidence of mitral valve  regurgitation. No evidence of mitral stenosis.   6. The aortic valve was not assessed. Aortic valve regurgitation is  not  visualized. No aortic stenosis is present.   7. The inferior vena cava is normal in size with greater than 50%  respiratory variability, suggesting right atrial pressure of 3 mmHg.   EKG:  EKG is ordered today.  Personally reviewed shows atrial fibrillation with controlled ventricular rate, otherwise normal.  QTc 400 ms. Recent Labs: 04/05/2020: B Natriuretic Peptide 282.2 04/10/2020: Magnesium 2.3 07/04/2020: ALT 9; BUN 13; Creatinine, Ser 0.74; Hemoglobin 13.8; Platelets 90; Potassium 4.3; Sodium 143; TSH 2.190  Recent Lipid Panel No results found  for: CHOL, TRIG, HDL, CHOLHDL, VLDL, LDLCALC, LDLDIRECT  Physical Exam:    VS:  BP (!) 98/56 (BP Location: Left Arm, Patient Position: Sitting, Cuff Size: Normal)   Pulse 60   Ht '5\' 7"'$  (1.702 m)   Wt 197 lb (89.4 kg)   BMI 30.85 kg/m     Wt Readings from Last 3 Encounters:  09/25/20 197 lb (89.4 kg)  08/16/20 199 lb 1.2 oz (90.3 kg)  07/25/20 201 lb 6.4 oz (91.4 kg)    General: Alert, oriented x3, no distress, borderline obese Head: no evidence of trauma, PERRL, EOMI, no exophtalmos or lid lag, no myxedema, no xanthelasma; normal ears, nose and oropharynx Neck: normal jugular venous pulsations and no hepatojugular reflux; brisk carotid pulses without delay and no carotid bruits Chest: clear to auscultation, no signs of consolidation by percussion or palpation, normal fremitus, symmetrical and full respiratory excursions Cardiovascular: normal position and quality of the apical impulse, regular rhythm, normal first and second heart sounds, no murmurs, rubs or gallops Abdomen: no tenderness or distention, no masses by palpation, no abnormal pulsatility or arterial bruits, normal bowel sounds, no hepatosplenomegaly Extremities: no clubbing, cyanosis or edema; orthopedic boot on right lower extremity; 2+ radial, ulnar and brachial pulses bilaterally; 2+ right femoral, posterior tibial and dorsalis pedis pulses; 2+ left femoral, posterior  tibial and dorsalis pedis pulses; no subclavian or femoral bruits Neurological: grossly nonfocal Psych: Normal mood and affect    ASSESSMENT:    1. Persistent atrial fibrillation ([place])   2. Syncope and collapse   3. Chronic diastolic heart failure ([place])   4. Abnormal thyroid function test   5. Prediabetes   6. Mild obesity   7. CLL (chronic lymphocytic leukemia) ([facility])     PLAN:    In order of problems listed above:  Syncope: The very abrupt nature of her syncopal event, without being associated with a change in position is highly concerning for an arrhythmic event.  It is conceivable that she may even have had spontaneous termination of atrial fibrillation with a posttermination pause.  Recommend an implantable loop recorder. This procedure has been fully reviewed with the patient and written informed consent has been obtained.  The event does not appear compatible with hypotension. AFib: Now being managed as a persistent arrhythmia with rate control only.  Failed multiple attempts at cardioversion, even when on antiarrhythmic medications.  Ventricular rate appears to be slow (although I warned her that the pulse oximeter likely underestimates the true heart rate).  We will stop the digoxin and continue on metoprolol only.   CHADSVasc 3 (age 2, gender).  On Eliquis.  Continue metoprolol and digoxin for rate control.  Chr diast HF: She developed biventricular signs of heart failure when she had prolonged atrial fibrillation with rapid ventricular rates.  Once she achieved good rate control she has not required diuretics.  Normal LVEF by echo. Abnormal thyroid tests: I am not convinced she truly had hyperthyroidism, but most likely had sick euthyroid syndrome.  Most recent thyroid labs were normal. Prediabetes: A1c 6.1% off medications. Mild obesity: Weight loss would help with correction of metabolic abnormalities. CLL: Mild stable thrombocytopenia most recently 90,000.  She is not anemic.   Most recent hemoglobin 13.8  The risks of benefits of cardioversion and sedation were discussed again in detail with the patient and she agrees to proceed.  Patient Instructions  Medication Instructions:  STOP the Digoxin  *If you need a refill on your cardiac medications before your next  appointment, please call your pharmacy*   Lab Work: None ordered If you have labs (blood work) drawn today and your tests are completely normal, you will receive your results only by: [place] (if you have MyChart) OR A paper copy in the mail If you have any lab test that is abnormal or we need to change your treatment, we will call you to review the results.   Testing/Procedures: None ordered   Follow-Up: At [facility], you and your health needs are our priority.  As part of our continuing mission to provide you with exceptional heart care, we have created designated Provider Care Teams.  These Care Teams include your primary Cardiologist (physician) and Advanced Practice Providers (APPs -  Physician Assistants and Nurse Practitioners) who all work together to provide you with the care you need, when you need it.  We recommend signing up for the patient portal called "MyChart".  Sign up information is provided on this After Visit Summary.  MyChart is used to connect with patients for Virtual Visits (Telemedicine).  Patients are able to view lab/test results, encounter notes, upcoming appointments, etc.  Non-urgent messages can be sent to your provider as well.   To learn more about what you can do with MyChart, go to [email or web address].    Your next appointment:   6 month(s)  The format for your next appointment:   In Person  Provider:   You may see Sanda Klein, MD or one of the following Advanced Practice Providers on your designated Care Team:   Almyra Deforest, PA-C Fabian Sharp, [place] or  Roby Lofts, PA-C    Signed, Sanda Klein, MD  09/25/2020 10:01 AM    [place]

## 2020-09-25 NOTE — Patient Instructions (Signed)
Medication Instructions:  STOP the Digoxin  *If you need a refill on your cardiac medications before your next appointment, please call your pharmacy*   Lab Work: None ordered If you have labs (blood work) drawn today and your tests are completely normal, you will receive your results only by: [place] (if you have MyChart) OR A paper copy in the mail If you have any lab test that is abnormal or we need to change your treatment, we will call you to review the results.   Testing/Procedures: None ordered   Follow-Up: At [facility], you and your health needs are our priority.  As part of our continuing mission to provide you with exceptional heart care, we have created designated Provider Care Teams.  These Care Teams include your primary Cardiologist (physician) and Advanced Practice Providers (APPs -  Physician Assistants and Nurse Practitioners) who all work together to provide you with the care you need, when you need it.  We recommend signing up for the patient portal called "MyChart".  Sign up information is provided on this After Visit Summary.  MyChart is used to connect with patients for Virtual Visits (Telemedicine).  Patients are able to view lab/test results, encounter notes, upcoming appointments, etc.  Non-urgent messages can be sent to your provider as well.   To learn more about what you can do with MyChart, go to [email or web address].    Your next appointment:   6 month(s)  The format for your next appointment:   In Person  Provider:   You may see Sanda Klein, MD or one of the following Advanced Practice Providers on your designated Care Team:   Almyra Deforest, PA-C Fabian Sharp, PA-C or  Roby Lofts, [place]

## 2020-10-01 DIAGNOSIS — S92331D Displaced fracture of third metatarsal bone, right foot, subsequent encounter for fracture with routine healing: Secondary | ICD-10-CM | POA: Diagnosis not present

## 2020-10-01 DIAGNOSIS — S92341D Displaced fracture of fourth metatarsal bone, right foot, subsequent encounter for fracture with routine healing: Secondary | ICD-10-CM | POA: Diagnosis not present

## 2020-10-01 DIAGNOSIS — S92491D Other fracture of right great toe, subsequent encounter for fracture with routine healing: Secondary | ICD-10-CM | POA: Diagnosis not present

## 2020-10-01 DIAGNOSIS — S92321D Displaced fracture of second metatarsal bone, right foot, subsequent encounter for fracture with routine healing: Secondary | ICD-10-CM | POA: Diagnosis not present

## 2020-10-08 ENCOUNTER — Other Ambulatory Visit: Payer: Self-pay | Admitting: Cardiovascular Disease

## 2020-10-08 DIAGNOSIS — I4819 Other persistent atrial fibrillation: Secondary | ICD-10-CM

## 2020-10-08 NOTE — Telephone Encounter (Signed)
Prescription refill request for Eliquis received. Indication:afib Last office visit:croitoru 09/25/20 Scr:0.74 07/04/20 Age: 36f Weight:89.4kg

## 2020-10-09 NOTE — Progress Notes (Signed)
[place]  [phone] [phone]     ID: Tasha [name] DOB: 1940-07-04  MR#: [id number]  WVP#:[id number]  Patient Care Team: Lavone Orn, MD as PCP - General (Internal Medicine) Sanda Klein, MD as PCP - Cardiology (Cardiology) Croitoru, Dani Gobble, MD as Consulting Physician (Cardiology) Hafsah Hendler, Virgie Dad, MD as Consulting Physician (Oncology) Sabas Sous, MD as Referring Physician (Internal Medicine) OTHER MD:  CHIEF COMPLAINT: Chronic Lymphoid Leukemia  CURRENT TREATMENT: IVIG, evusheld   INTERVAL HISTORY: Tasha [name] returns today for follow up of her chronic lymphoid leukemia. She continues under observation.  She is accompanied by her daughter Tasha [name].  She received IVIG in May.  She tells me she had a reaction and that she always has a reaction to almost anything.  She did not receive evusheld at that time.  We repeated labs late June and she had an excellent IgG level at that time  On 08/16/2020 she was seen at the emergency department with multiple closed fractures of her right foot after syncopal episode/fall.  Recall that she has a history of MS, some instability, and does use a walker.  On 09/25/2020 she was evaluated by cardiology with concerns that the syncopal episode in July may have been due to arrhythmia.  The patient does have a history of atrial fibrillation.  An implantable loop recorder was recommended.   REVIEW OF SYSTEMS: Tasha [name] continues to work on the broken foot/ankle problem.  She thinks she will be ready for rehab in the next couple of weeks.  She says once this problem has been taking care of she will have her implantable monitor placed.  She has not seen her neurologist in several years and wonders if this is a good time to get back to her.  Aside from that a detailed review of systems today was stable   COVID 19 VACCINATION STATUS: Had COVID March 2022, received antibodies.  Also had the [place] vaccine x2 followed by 1  booster   HISTORY OF CURRENT ILLNESS: Tasha [name] has been following up with Dr. Sabas Sous at [facility]. From Dr. Tawanna Sat 03/23/2017 note:  Tasha [name] is a 80 y.o. [place] woman with chronic lymphocytic leukemia diagnosed in 2001, Rai stage 0. She has been followed at [facility] by Dr. Sabas Sous at [facility], who is retiring (the end of an era).  Dr. Laurance Flatten was seeing Tasha [name] on a once a year basis.  She has not required treatment to date.  Most recent labs obtained at [facility] 03/22/2017, showed a hemoglobin of 13.8, MCV 88, white cell count 6.1, platelets 101,000, unchanged since at least August 2007); with IgA 51, IgG 421, and IgM 11.  (The IgG has dropped from 642 August 2010 to 421 March 2019).  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  Beta-2 microglobulin obtained 11/30/2000 was not elevated at 1.9.  Sed rate on the same date was 4  The patient's subsequent history is as detailed below.   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   IFG (impaired fasting glucose)    e   Personal history of other diseases of circulatory system    Tick bite    Unspecified hereditary and idiopathic peripheral neuropathy    Vitamin B12 deficiency    Vitamin D deficiency   Charcoid-Marie's tooth Disease   PAST SURGICAL HISTORY: Past Surgical History:  Procedure Laterality Date   ABDOMINAL HYSTERECTOMY  1984    fibroids   BREAST SURGERY  1960s   benign breast tumors   CARDIOVERSION N/A 05/15/2020   Procedure: CARDIOVERSION;  Surgeon: Sanda Klein, MD;  Location: [place];  Service: Cardiovascular;  Laterality: N/A;   CARDIOVERSION N/A 07/02/2020   Procedure: CARDIOVERSION;  Surgeon: Thayer Headings, MD;  Location: [place];  Service: Cardiovascular;  Laterality: N/A;   CATARACT EXTRACTION W/ INTRAOCULAR LENS  IMPLANT, BILATERAL     LAPAROSCOPIC BILATERAL SALPINGO OOPHERECTOMY  2003   ovarian cyst  Tonsillectomy as a child.    FAMILY HISTORY Family History  Problem Relation Age of Onset   Hypertension Mother    Coronary artery disease Father        died at 28   Hypertension Father    Diabetes Father    Coronary artery disease Brother        died at 33   Diabetes Brother    Hypertension Brother    Cancer Daughter   The patient's mother died at age 57 due to stroke and neurological problems similar to the patient's. The patient's father died at age 7 due to heart problems and diabetes. The patient had 1 brother who died with heart problems. The patient has 2 sisters, one of whom has diabetes. There was a maternal grandfather with prostate cancer.  The patient denies any other cancers such as lymphoma or myeloma in the family.    GYNECOLOGIC HISTORY:  No LMP recorded. Patient has had a hysterectomy. Menarche: 80 years old Age at first live birth: 80 years old She is GXP3.  LMP: That is post hysterectomy in 1984 fibroids. She used HRT for less than 2 years with no complications.    SOCIAL HISTORY: (Updated August 2021) Tasha [name] retired in 2001 due to her CLL. She worked at [employer] in [employer]. She and her husband, [name] have been married for 55 years as of August 2020. [name] used to work for [employer], and also has been a Psychologist, occupational at [employer]. The patient's oldest daughter, [name], worked at [employer] in the billing department but her job was  terminated 2020 and she is now working for [employer]. The patient's second daughter is Tasha [name], who is a principal at [employer]. The patient's son, [name], is an estimator for home remodeling... At home, is the patient, her husband, and her grandson, 2 y/o Haze Boyden ([name]'s son), who is doing very well academically and has a strong interest in math. [name]'s mother is "not in his life". The patient attends [facility] [employer].     Healthcare power of attorney: The patient's daughter Tasha [name] is her healthcare power of attorney  HEALTH MAINTENANCE: Social History   Tobacco Use   Smoking status: Never   Smokeless tobacco: Never  Substance Use Topics   Alcohol use: No    Alcohol/week: 0.0 standard drinks   Drug use: No     Colonoscopy: refuses   PAP: s/p hysterectomy  Bone density: 2018 was  normal  Mammography: "no more" after 2017 per her PCP   Allergies  Allergen Reactions   Clindamycin Itching and Rash   Celebrex [Celecoxib] Other (See Comments)    Hypertension, possible TIA   Statins Other (See Comments)    Muscle aches   Tape     Patient CAN tolerate Coban Wrap only (NO TAPE!!)   Tricor [Fenofibrate] Other (See Comments)    Elevated liver enzymes   Amoxicillin Rash   Clarithromycin Rash   Latex Rash   Methimazole Rash    Current Outpatient Medications  Medication Sig Dispense Refill   acetaminophen (TYLENOL) 500 MG tablet Take 500-1,000 mg by mouth 2 (two) times daily as needed (pain.).     cyanocobalamin 1000 MCG tablet Take 1,000 mcg by mouth daily.     ELIQUIS 5 MG TABS tablet TAKE 1 TABLET(5 MG) BY MOUTH TWICE DAILY 180 tablet 1   furosemide (LASIX) 40 MG tablet Take 1 tablet (40 mg total) by mouth daily. Do not take if your weight is less than 195 lbs (Patient not taking: Reported on 09/25/2020) 30 tablet 5   metoprolol tartrate (LOPRESSOR) 100 MG tablet TAKE 1 TABLET(100 MG) BY MOUTH TWICE DAILY 180 tablet 3   Polyethyl Glycol-Propyl Glycol  (SYSTANE) 0.4-0.3 % SOLN Place 1-2 drops into both eyes in the morning and at bedtime.     potassium chloride SA (KLOR-CON M20) 20 MEQ tablet Take 1 tablet (20 mEq total) by mouth See admin instructions. Do not take if your weight is less than 195 lbs (Patient taking differently: Take 20 mEq by mouth daily as needed. Do not take if your weight is less than 195 lbs) 90 tablet 3   VITAMIN D, CHOLECALCIFEROL, PO Take 1,000 Units by mouth daily.     No current facility-administered medications for this visit.    OBJECTIVE: White woman examined in a wheelchair Vitals:   10/10/20 1200  BP: (!) 119/36  Pulse: 89  Resp: 18  Temp: (!) 97.5 F (36.4 C)  SpO2: 99%      There is no height or weight on file to calculate BMI.   Wt Readings from Last 3 Encounters:  09/25/20 197 lb (89.4 kg)  08/16/20 199 lb 1.2 oz (90.3 kg)  07/25/20 201 lb 6.4 oz (91.4 kg)  ECOG FS:1 - Symptomatic but completely ambulatory  Sclerae unicteric, EOMs intact Wearing a mask No cervical or supraclavicular adenopathy, no axillary adenopathy Lungs no rales or rhonchi Heart regular rate and rhythm Abd soft, nontender, positive bowel sounds MSK no focal spinal tenderness, no upper extremity lymphedema Neuro: nonfocal, well oriented, appropriate affect Breasts: Deferred   LAB RESULTS:  CMP     Component Value Date/Time   NA 143 07/04/2020 1011   NA 141 06/27/2020 1041   K 4.3 07/04/2020 1011   CL 109 07/04/2020 1011   CO2 25 07/04/2020 1011   GLUCOSE 144 (H) 07/04/2020 1011   BUN 13 07/04/2020 1011   BUN 15 06/27/2020 1041   CREATININE 0.74 07/04/2020 1011   CREATININE 0.74 01/31/2018 1358   CALCIUM 9.0 07/04/2020 1011   PROT 6.2 (L) 07/04/2020 1011   ALBUMIN 3.6 07/04/2020 1011   AST 13 (L) 07/04/2020 1011   AST 11 (L) 01/31/2018 1358   ALT 9 07/04/2020 1011   ALT 13 01/31/2018 1358   ALKPHOS 73 07/04/2020 1011   BILITOT 0.6 07/04/2020 1011   BILITOT 0.6 01/31/2018 1358   GFRNONAA >60 07/04/2020  1011   GFRNONAA >60 01/31/2018  [place] 09/12/2019 0853   GFRAA >60 01/31/2018 1358    No results found for: TOTALPROTELP, ALBUMINELP, A1GS, A2GS, BETS, BETA2SER, GAMS, MSPIKE, SPEI  No results found for: KPAFRELGTCHN, LAMBDASER, [facility]  Lab Results  Component Value Date   WBC 5.2 10/10/2020   NEUTROABS 2.8 10/10/2020   HGB 13.4 10/10/2020   HCT 39.3 10/10/2020   MCV 83.6 10/10/2020   PLT 106 (L) 10/10/2020   No results found for: LABCA2  No components found for: HDQQIW979  No results for input(s): INR in the last 168 hours.  No results found for: LABCA2  No results found for: GXQ119  No results found for: ERD408  No results found for: XKG818  No results found for: CA2729  No components found for: HGQUANT  No results found for: CEA1 / No results found for: CEA1   No results found for: AFPTUMOR  No results found for: CHROMOGRNA  No results found for: HGBA, HGBA2QUANT, HGBFQUANT, HGBSQUAN (Hemoglobinopathy evaluation)   Lab Results  Component Value Date   LDH 255 (H) 07/04/2020    No results found for: IRON, TIBC, IRONPCTSAT (Iron and TIBC)  No results found for: FERRITIN  Urinalysis    Component Value Date/Time   COLORURINE AMBER (A) 04/05/2020 0606   APPEARANCEUR HAZY (A) 04/05/2020 0606   LABSPEC 1.018 04/05/2020 0606   PHURINE 5.0 04/05/2020 0606   GLUCOSEU NEGATIVE 04/05/2020 0606   HGBUR SMALL (A) 04/05/2020 0606   BILIRUBINUR NEGATIVE 04/05/2020 0606   KETONESUR 20 (A) 04/05/2020 0606   PROTEINUR 100 (A) 04/05/2020 0606   UROBILINOGEN 0.2 05/13/2009 0821   NITRITE NEGATIVE 04/05/2020 0606   LEUKOCYTESUR NEGATIVE 04/05/2020 0606    STUDIES: No results found.   ELIGIBLE FOR AVAILABLE RESEARCH PROTOCOL: no  ASSESSMENT: 80 y.o. 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  Her disease is very stable and does not require specific treatment at this point.  She has not had unusual infections despite the low IgG titers she has had in the past.  We obtained a repeat today but those results are pending.  She has had 3 vaccine doses for COVID as well as the disease itself earlier this year.  She is unlikely to make great response to vaccines but I recommend that she go ahead and get the current Pfizer version.  Her major issues right now are recovering from the ankle fracture, finding out whether she had a significant arrhythmia causing her recent fall, and I think it would be prudent for her to get back to her neurologist whom she has not seen in several years so I went ahead and placed that referral.  Otherwise with her excellent counts today I am scheduling her to see is 1 more time a year from now at which point she may or may not decide that further follow-up here will not be necessary  Total encounter time 25 minutes.*  Total encounter time 20 minutes.Tasha [name] C. Wm Sahagun, MD  10/10/20 12:04 PM Medical Oncology and Hematology [facility] [place], [place] [address] Tel. [phone]    Fax. [phone]   I, Wilburn Mylar, am acting as scribe for Dr. Virgie Dad. Aiken Withem.  I, Lurline Del MD, have reviewed the above documentation for accuracy and completeness, and I agree with the above.   *Total Encounter Time as defined by the Centers for Medicare and Medicaid Services includes, in addition to the face-to-face time of a patient visit  (documented in the note above) non-face-to-face time: obtaining and reviewing outside history, ordering and reviewing medications, tests or procedures, care coordination (communications with other health care professionals or caregivers) and documentation in the medical record.

## 2020-10-10 ENCOUNTER — Inpatient Hospital Stay (HOSPITAL_BASED_OUTPATIENT_CLINIC_OR_DEPARTMENT_OTHER): Payer: Medicare Other | Admitting: Oncology

## 2020-10-10 ENCOUNTER — Ambulatory Visit: Payer: Medicare Other

## 2020-10-10 ENCOUNTER — Other Ambulatory Visit: Payer: Self-pay

## 2020-10-10 ENCOUNTER — Inpatient Hospital Stay: Payer: Medicare Other

## 2020-10-10 ENCOUNTER — Inpatient Hospital Stay: Payer: Medicare Other | Attending: Oncology

## 2020-10-10 VITALS — BP 119/36 | HR 89 | Temp 97.5°F | Resp 18

## 2020-10-10 DIAGNOSIS — Z809 Family history of malignant neoplasm, unspecified: Secondary | ICD-10-CM | POA: Insufficient documentation

## 2020-10-10 DIAGNOSIS — I4891 Unspecified atrial fibrillation: Secondary | ICD-10-CM | POA: Insufficient documentation

## 2020-10-10 DIAGNOSIS — C9110 Chronic lymphocytic leukemia of B-cell type not having achieved remission: Secondary | ICD-10-CM

## 2020-10-10 DIAGNOSIS — G35 Multiple sclerosis: Secondary | ICD-10-CM | POA: Diagnosis not present

## 2020-10-10 DIAGNOSIS — Z8616 Personal history of COVID-19: Secondary | ICD-10-CM | POA: Insufficient documentation

## 2020-10-10 DIAGNOSIS — Z90710 Acquired absence of both cervix and uterus: Secondary | ICD-10-CM | POA: Insufficient documentation

## 2020-10-10 DIAGNOSIS — D696 Thrombocytopenia, unspecified: Secondary | ICD-10-CM | POA: Insufficient documentation

## 2020-10-10 LAB — CBC WITH DIFFERENTIAL/PLATELET
Abs Immature Granulocytes: 0.02 10*3/uL (ref 0.00–0.07)
Basophils Absolute: 0 10*3/uL (ref 0.0–0.1)
Basophils Relative: 0 %
Eosinophils Absolute: 0.2 10*3/uL (ref 0.0–0.5)
Eosinophils Relative: 4 %
HCT: 39.3 % (ref 36.0–46.0)
Hemoglobin: 13.4 g/dL (ref 12.0–15.0)
Immature Granulocytes: 0 %
Lymphocytes Relative: 30 %
Lymphs Abs: 1.6 10*3/uL (ref 0.7–4.0)
MCH: 28.5 pg (ref 26.0–34.0)
MCHC: 34.1 g/dL (ref 30.0–36.0)
MCV: 83.6 fL (ref 80.0–100.0)
Monocytes Absolute: 0.6 10*3/uL (ref 0.1–1.0)
Monocytes Relative: 12 %
Neutro Abs: 2.8 10*3/uL (ref 1.7–7.7)
Neutrophils Relative %: 54 %
Platelets: 106 10*3/uL — ABNORMAL LOW (ref 150–400)
RBC: 4.7 MIL/uL (ref 3.87–5.11)
RDW: 15.7 % — ABNORMAL HIGH (ref 11.5–15.5)
WBC: 5.2 10*3/uL (ref 4.0–10.5)
nRBC: 0 % (ref 0.0–0.2)

## 2020-10-10 LAB — COMPREHENSIVE METABOLIC PANEL
ALT: 9 U/L (ref 0–44)
AST: 10 U/L — ABNORMAL LOW (ref 15–41)
Albumin: 4 g/dL (ref 3.5–5.0)
Alkaline Phosphatase: 79 U/L (ref 38–126)
Anion gap: 8 (ref 5–15)
BUN: 19 mg/dL (ref 8–23)
CO2: 24 mmol/L (ref 22–32)
Calcium: 9.6 mg/dL (ref 8.9–10.3)
Chloride: 108 mmol/L (ref 98–111)
Creatinine, Ser: 0.75 mg/dL (ref 0.44–1.00)
GFR, Estimated: >60 mL/min (ref >60)
Glucose, Bld: 115 mg/dL — ABNORMAL HIGH (ref 70–99)
Potassium: 4.1 mmol/L (ref 3.5–5.1)
Sodium: 140 mmol/L (ref 135–145)
Total Bilirubin: 1 mg/dL (ref 0.3–1.2)
Total Protein: 6.1 g/dL — ABNORMAL LOW (ref 6.5–8.1)

## 2020-10-10 LAB — SAVE SMEAR(SSMR), FOR PROVIDER SLIDE REVIEW

## 2020-10-10 LAB — LACTATE DEHYDROGENASE: LDH: 255 U/L — ABNORMAL HIGH (ref 98–192)

## 2020-10-11 LAB — IGG, IGA, IGM
IgA: 31 mg/dL — ABNORMAL LOW (ref 64–422)
IgG (Immunoglobin G), Serum: 437 mg/dL — ABNORMAL LOW (ref 586–1602)
IgM (Immunoglobulin M), Srm: 12 mg/dL — ABNORMAL LOW (ref 26–217)

## 2020-10-11 LAB — BETA 2 MICROGLOBULIN, SERUM: Beta-2 Microglobulin: 3.6 mg/L — ABNORMAL HIGH (ref 0.6–2.4)

## 2020-10-23 ENCOUNTER — Telehealth: Payer: Self-pay | Admitting: Hematology and Oncology

## 2020-10-23 NOTE — Telephone Encounter (Signed)
Sch per 9/23 los, pt aware

## 2020-10-25 DIAGNOSIS — I4819 Other persistent atrial fibrillation: Secondary | ICD-10-CM | POA: Diagnosis not present

## 2020-10-25 DIAGNOSIS — D696 Thrombocytopenia, unspecified: Secondary | ICD-10-CM | POA: Diagnosis not present

## 2020-10-25 DIAGNOSIS — S99101D Unspecified physeal fracture of right metatarsal, subsequent encounter for fracture with routine healing: Secondary | ICD-10-CM | POA: Diagnosis not present

## 2020-10-25 DIAGNOSIS — G510 Bell's palsy: Secondary | ICD-10-CM | POA: Diagnosis not present

## 2020-10-25 DIAGNOSIS — G35 Multiple sclerosis: Secondary | ICD-10-CM | POA: Diagnosis not present

## 2020-10-25 DIAGNOSIS — S92331D Displaced fracture of third metatarsal bone, right foot, subsequent encounter for fracture with routine healing: Secondary | ICD-10-CM | POA: Diagnosis not present

## 2020-10-25 DIAGNOSIS — S92341D Displaced fracture of fourth metatarsal bone, right foot, subsequent encounter for fracture with routine healing: Secondary | ICD-10-CM | POA: Diagnosis not present

## 2020-10-25 DIAGNOSIS — E559 Vitamin D deficiency, unspecified: Secondary | ICD-10-CM | POA: Diagnosis not present

## 2020-10-25 DIAGNOSIS — E538 Deficiency of other specified B group vitamins: Secondary | ICD-10-CM | POA: Diagnosis not present

## 2020-10-25 DIAGNOSIS — E0590 Thyrotoxicosis, unspecified without thyrotoxic crisis or storm: Secondary | ICD-10-CM | POA: Diagnosis not present

## 2020-10-25 DIAGNOSIS — Z8616 Personal history of COVID-19: Secondary | ICD-10-CM | POA: Diagnosis not present

## 2020-10-25 DIAGNOSIS — S92321D Displaced fracture of second metatarsal bone, right foot, subsequent encounter for fracture with routine healing: Secondary | ICD-10-CM | POA: Diagnosis not present

## 2020-10-25 DIAGNOSIS — G600 Hereditary motor and sensory neuropathy: Secondary | ICD-10-CM | POA: Diagnosis not present

## 2020-10-25 DIAGNOSIS — Z7901 Long term (current) use of anticoagulants: Secondary | ICD-10-CM | POA: Diagnosis not present

## 2020-10-25 DIAGNOSIS — S92351D Displaced fracture of fifth metatarsal bone, right foot, subsequent encounter for fracture with routine healing: Secondary | ICD-10-CM | POA: Diagnosis not present

## 2020-11-01 DIAGNOSIS — I4819 Other persistent atrial fibrillation: Secondary | ICD-10-CM | POA: Diagnosis not present

## 2020-11-01 DIAGNOSIS — S92321D Displaced fracture of second metatarsal bone, right foot, subsequent encounter for fracture with routine healing: Secondary | ICD-10-CM | POA: Diagnosis not present

## 2020-11-01 DIAGNOSIS — S92341D Displaced fracture of fourth metatarsal bone, right foot, subsequent encounter for fracture with routine healing: Secondary | ICD-10-CM | POA: Diagnosis not present

## 2020-11-01 DIAGNOSIS — S99101D Unspecified physeal fracture of right metatarsal, subsequent encounter for fracture with routine healing: Secondary | ICD-10-CM | POA: Diagnosis not present

## 2020-11-01 DIAGNOSIS — S92351D Displaced fracture of fifth metatarsal bone, right foot, subsequent encounter for fracture with routine healing: Secondary | ICD-10-CM | POA: Diagnosis not present

## 2020-11-01 DIAGNOSIS — S92331D Displaced fracture of third metatarsal bone, right foot, subsequent encounter for fracture with routine healing: Secondary | ICD-10-CM | POA: Diagnosis not present

## 2020-11-07 DIAGNOSIS — S92321D Displaced fracture of second metatarsal bone, right foot, subsequent encounter for fracture with routine healing: Secondary | ICD-10-CM | POA: Diagnosis not present

## 2020-11-07 DIAGNOSIS — I4819 Other persistent atrial fibrillation: Secondary | ICD-10-CM | POA: Diagnosis not present

## 2020-11-07 DIAGNOSIS — S92341D Displaced fracture of fourth metatarsal bone, right foot, subsequent encounter for fracture with routine healing: Secondary | ICD-10-CM | POA: Diagnosis not present

## 2020-11-07 DIAGNOSIS — S99101D Unspecified physeal fracture of right metatarsal, subsequent encounter for fracture with routine healing: Secondary | ICD-10-CM | POA: Diagnosis not present

## 2020-11-07 DIAGNOSIS — S92351D Displaced fracture of fifth metatarsal bone, right foot, subsequent encounter for fracture with routine healing: Secondary | ICD-10-CM | POA: Diagnosis not present

## 2020-11-07 DIAGNOSIS — S92331D Displaced fracture of third metatarsal bone, right foot, subsequent encounter for fracture with routine healing: Secondary | ICD-10-CM | POA: Diagnosis not present

## 2020-11-08 DIAGNOSIS — Z23 Encounter for immunization: Secondary | ICD-10-CM | POA: Diagnosis not present

## 2020-11-13 DIAGNOSIS — S92341D Displaced fracture of fourth metatarsal bone, right foot, subsequent encounter for fracture with routine healing: Secondary | ICD-10-CM | POA: Diagnosis not present

## 2020-11-13 DIAGNOSIS — S92331D Displaced fracture of third metatarsal bone, right foot, subsequent encounter for fracture with routine healing: Secondary | ICD-10-CM | POA: Diagnosis not present

## 2020-11-13 DIAGNOSIS — S92321D Displaced fracture of second metatarsal bone, right foot, subsequent encounter for fracture with routine healing: Secondary | ICD-10-CM | POA: Diagnosis not present

## 2020-11-13 DIAGNOSIS — S99101D Unspecified physeal fracture of right metatarsal, subsequent encounter for fracture with routine healing: Secondary | ICD-10-CM | POA: Diagnosis not present

## 2020-11-13 DIAGNOSIS — S92351D Displaced fracture of fifth metatarsal bone, right foot, subsequent encounter for fracture with routine healing: Secondary | ICD-10-CM | POA: Diagnosis not present

## 2020-11-13 DIAGNOSIS — I4819 Other persistent atrial fibrillation: Secondary | ICD-10-CM | POA: Diagnosis not present

## 2020-11-20 DIAGNOSIS — S92341D Displaced fracture of fourth metatarsal bone, right foot, subsequent encounter for fracture with routine healing: Secondary | ICD-10-CM | POA: Diagnosis not present

## 2020-11-20 DIAGNOSIS — S92351D Displaced fracture of fifth metatarsal bone, right foot, subsequent encounter for fracture with routine healing: Secondary | ICD-10-CM | POA: Diagnosis not present

## 2020-11-20 DIAGNOSIS — I4819 Other persistent atrial fibrillation: Secondary | ICD-10-CM | POA: Diagnosis not present

## 2020-11-20 DIAGNOSIS — S99101D Unspecified physeal fracture of right metatarsal, subsequent encounter for fracture with routine healing: Secondary | ICD-10-CM | POA: Diagnosis not present

## 2020-11-20 DIAGNOSIS — S92321D Displaced fracture of second metatarsal bone, right foot, subsequent encounter for fracture with routine healing: Secondary | ICD-10-CM | POA: Diagnosis not present

## 2020-11-20 DIAGNOSIS — S92331D Displaced fracture of third metatarsal bone, right foot, subsequent encounter for fracture with routine healing: Secondary | ICD-10-CM | POA: Diagnosis not present

## 2020-11-24 DIAGNOSIS — G600 Hereditary motor and sensory neuropathy: Secondary | ICD-10-CM | POA: Diagnosis not present

## 2020-11-24 DIAGNOSIS — S92331D Displaced fracture of third metatarsal bone, right foot, subsequent encounter for fracture with routine healing: Secondary | ICD-10-CM | POA: Diagnosis not present

## 2020-11-24 DIAGNOSIS — E0590 Thyrotoxicosis, unspecified without thyrotoxic crisis or storm: Secondary | ICD-10-CM | POA: Diagnosis not present

## 2020-11-24 DIAGNOSIS — S92341D Displaced fracture of fourth metatarsal bone, right foot, subsequent encounter for fracture with routine healing: Secondary | ICD-10-CM | POA: Diagnosis not present

## 2020-11-24 DIAGNOSIS — E538 Deficiency of other specified B group vitamins: Secondary | ICD-10-CM | POA: Diagnosis not present

## 2020-11-24 DIAGNOSIS — Z7901 Long term (current) use of anticoagulants: Secondary | ICD-10-CM | POA: Diagnosis not present

## 2020-11-24 DIAGNOSIS — I4819 Other persistent atrial fibrillation: Secondary | ICD-10-CM | POA: Diagnosis not present

## 2020-11-24 DIAGNOSIS — S92351D Displaced fracture of fifth metatarsal bone, right foot, subsequent encounter for fracture with routine healing: Secondary | ICD-10-CM | POA: Diagnosis not present

## 2020-11-24 DIAGNOSIS — E559 Vitamin D deficiency, unspecified: Secondary | ICD-10-CM | POA: Diagnosis not present

## 2020-11-24 DIAGNOSIS — S92321D Displaced fracture of second metatarsal bone, right foot, subsequent encounter for fracture with routine healing: Secondary | ICD-10-CM | POA: Diagnosis not present

## 2020-11-24 DIAGNOSIS — G35 Multiple sclerosis: Secondary | ICD-10-CM | POA: Diagnosis not present

## 2020-11-24 DIAGNOSIS — G510 Bell's palsy: Secondary | ICD-10-CM | POA: Diagnosis not present

## 2020-11-24 DIAGNOSIS — D696 Thrombocytopenia, unspecified: Secondary | ICD-10-CM | POA: Diagnosis not present

## 2020-11-24 DIAGNOSIS — Z8616 Personal history of COVID-19: Secondary | ICD-10-CM | POA: Diagnosis not present

## 2020-11-24 DIAGNOSIS — S99101D Unspecified physeal fracture of right metatarsal, subsequent encounter for fracture with routine healing: Secondary | ICD-10-CM | POA: Diagnosis not present

## 2020-11-27 DIAGNOSIS — S92351D Displaced fracture of fifth metatarsal bone, right foot, subsequent encounter for fracture with routine healing: Secondary | ICD-10-CM | POA: Diagnosis not present

## 2020-11-27 DIAGNOSIS — S99101D Unspecified physeal fracture of right metatarsal, subsequent encounter for fracture with routine healing: Secondary | ICD-10-CM | POA: Diagnosis not present

## 2020-11-27 DIAGNOSIS — S92331D Displaced fracture of third metatarsal bone, right foot, subsequent encounter for fracture with routine healing: Secondary | ICD-10-CM | POA: Diagnosis not present

## 2020-11-27 DIAGNOSIS — S92321D Displaced fracture of second metatarsal bone, right foot, subsequent encounter for fracture with routine healing: Secondary | ICD-10-CM | POA: Diagnosis not present

## 2020-11-27 DIAGNOSIS — I4819 Other persistent atrial fibrillation: Secondary | ICD-10-CM | POA: Diagnosis not present

## 2020-11-27 DIAGNOSIS — S92341D Displaced fracture of fourth metatarsal bone, right foot, subsequent encounter for fracture with routine healing: Secondary | ICD-10-CM | POA: Diagnosis not present

## 2020-12-04 DIAGNOSIS — S92351D Displaced fracture of fifth metatarsal bone, right foot, subsequent encounter for fracture with routine healing: Secondary | ICD-10-CM | POA: Diagnosis not present

## 2020-12-04 DIAGNOSIS — I4819 Other persistent atrial fibrillation: Secondary | ICD-10-CM | POA: Diagnosis not present

## 2020-12-04 DIAGNOSIS — S99101D Unspecified physeal fracture of right metatarsal, subsequent encounter for fracture with routine healing: Secondary | ICD-10-CM | POA: Diagnosis not present

## 2020-12-04 DIAGNOSIS — S92341D Displaced fracture of fourth metatarsal bone, right foot, subsequent encounter for fracture with routine healing: Secondary | ICD-10-CM | POA: Diagnosis not present

## 2020-12-04 DIAGNOSIS — S92331D Displaced fracture of third metatarsal bone, right foot, subsequent encounter for fracture with routine healing: Secondary | ICD-10-CM | POA: Diagnosis not present

## 2020-12-04 DIAGNOSIS — S92321D Displaced fracture of second metatarsal bone, right foot, subsequent encounter for fracture with routine healing: Secondary | ICD-10-CM | POA: Diagnosis not present

## 2020-12-11 DIAGNOSIS — S92341D Displaced fracture of fourth metatarsal bone, right foot, subsequent encounter for fracture with routine healing: Secondary | ICD-10-CM | POA: Diagnosis not present

## 2020-12-11 DIAGNOSIS — S99101D Unspecified physeal fracture of right metatarsal, subsequent encounter for fracture with routine healing: Secondary | ICD-10-CM | POA: Diagnosis not present

## 2020-12-11 DIAGNOSIS — S92331D Displaced fracture of third metatarsal bone, right foot, subsequent encounter for fracture with routine healing: Secondary | ICD-10-CM | POA: Diagnosis not present

## 2020-12-11 DIAGNOSIS — I4819 Other persistent atrial fibrillation: Secondary | ICD-10-CM | POA: Diagnosis not present

## 2020-12-11 DIAGNOSIS — S92351D Displaced fracture of fifth metatarsal bone, right foot, subsequent encounter for fracture with routine healing: Secondary | ICD-10-CM | POA: Diagnosis not present

## 2020-12-11 DIAGNOSIS — S92321D Displaced fracture of second metatarsal bone, right foot, subsequent encounter for fracture with routine healing: Secondary | ICD-10-CM | POA: Diagnosis not present

## 2020-12-19 DIAGNOSIS — S92351D Displaced fracture of fifth metatarsal bone, right foot, subsequent encounter for fracture with routine healing: Secondary | ICD-10-CM | POA: Diagnosis not present

## 2020-12-19 DIAGNOSIS — S92321D Displaced fracture of second metatarsal bone, right foot, subsequent encounter for fracture with routine healing: Secondary | ICD-10-CM | POA: Diagnosis not present

## 2020-12-19 DIAGNOSIS — S92341D Displaced fracture of fourth metatarsal bone, right foot, subsequent encounter for fracture with routine healing: Secondary | ICD-10-CM | POA: Diagnosis not present

## 2020-12-19 DIAGNOSIS — I4819 Other persistent atrial fibrillation: Secondary | ICD-10-CM | POA: Diagnosis not present

## 2020-12-19 DIAGNOSIS — S99101D Unspecified physeal fracture of right metatarsal, subsequent encounter for fracture with routine healing: Secondary | ICD-10-CM | POA: Diagnosis not present

## 2020-12-19 DIAGNOSIS — S92331D Displaced fracture of third metatarsal bone, right foot, subsequent encounter for fracture with routine healing: Secondary | ICD-10-CM | POA: Diagnosis not present

## 2021-02-11 DIAGNOSIS — Z87898 Personal history of other specified conditions: Secondary | ICD-10-CM | POA: Diagnosis not present

## 2021-02-11 DIAGNOSIS — G609 Hereditary and idiopathic neuropathy, unspecified: Secondary | ICD-10-CM | POA: Diagnosis not present

## 2021-02-11 DIAGNOSIS — I482 Chronic atrial fibrillation, unspecified: Secondary | ICD-10-CM | POA: Diagnosis not present

## 2021-03-24 DIAGNOSIS — H524 Presbyopia: Secondary | ICD-10-CM | POA: Diagnosis not present

## 2021-03-24 DIAGNOSIS — Z961 Presence of intraocular lens: Secondary | ICD-10-CM | POA: Diagnosis not present

## 2021-04-18 ENCOUNTER — Other Ambulatory Visit: Payer: Self-pay

## 2021-04-18 DIAGNOSIS — I4819 Other persistent atrial fibrillation: Secondary | ICD-10-CM

## 2021-04-18 MED ORDER — APIXABAN 5 MG PO TABS: ORAL_TABLET | ORAL | 1 refills | Status: DC

## 2021-04-18 NOTE — Telephone Encounter (Signed)
Prescription refill request for Eliquis received. ?Indication:Afib ?Last office visit:9/22 ?Scr:0.7 ?Age: 81 ?Weight:89.4 kg ? ?Prescription refilled ? ?

## 2021-05-08 ENCOUNTER — Other Ambulatory Visit: Payer: Self-pay | Admitting: Cardiovascular Disease

## 2021-05-20 IMAGING — DX DG CHEST 1V PORT
1 series · 1 of 1 positions shown · non-contrast
Comparison: 04/05/2019

CLINICAL DATA: Sob,COVID positive

EXAM:
PORTABLE CHEST 1 VIEW

[chest]
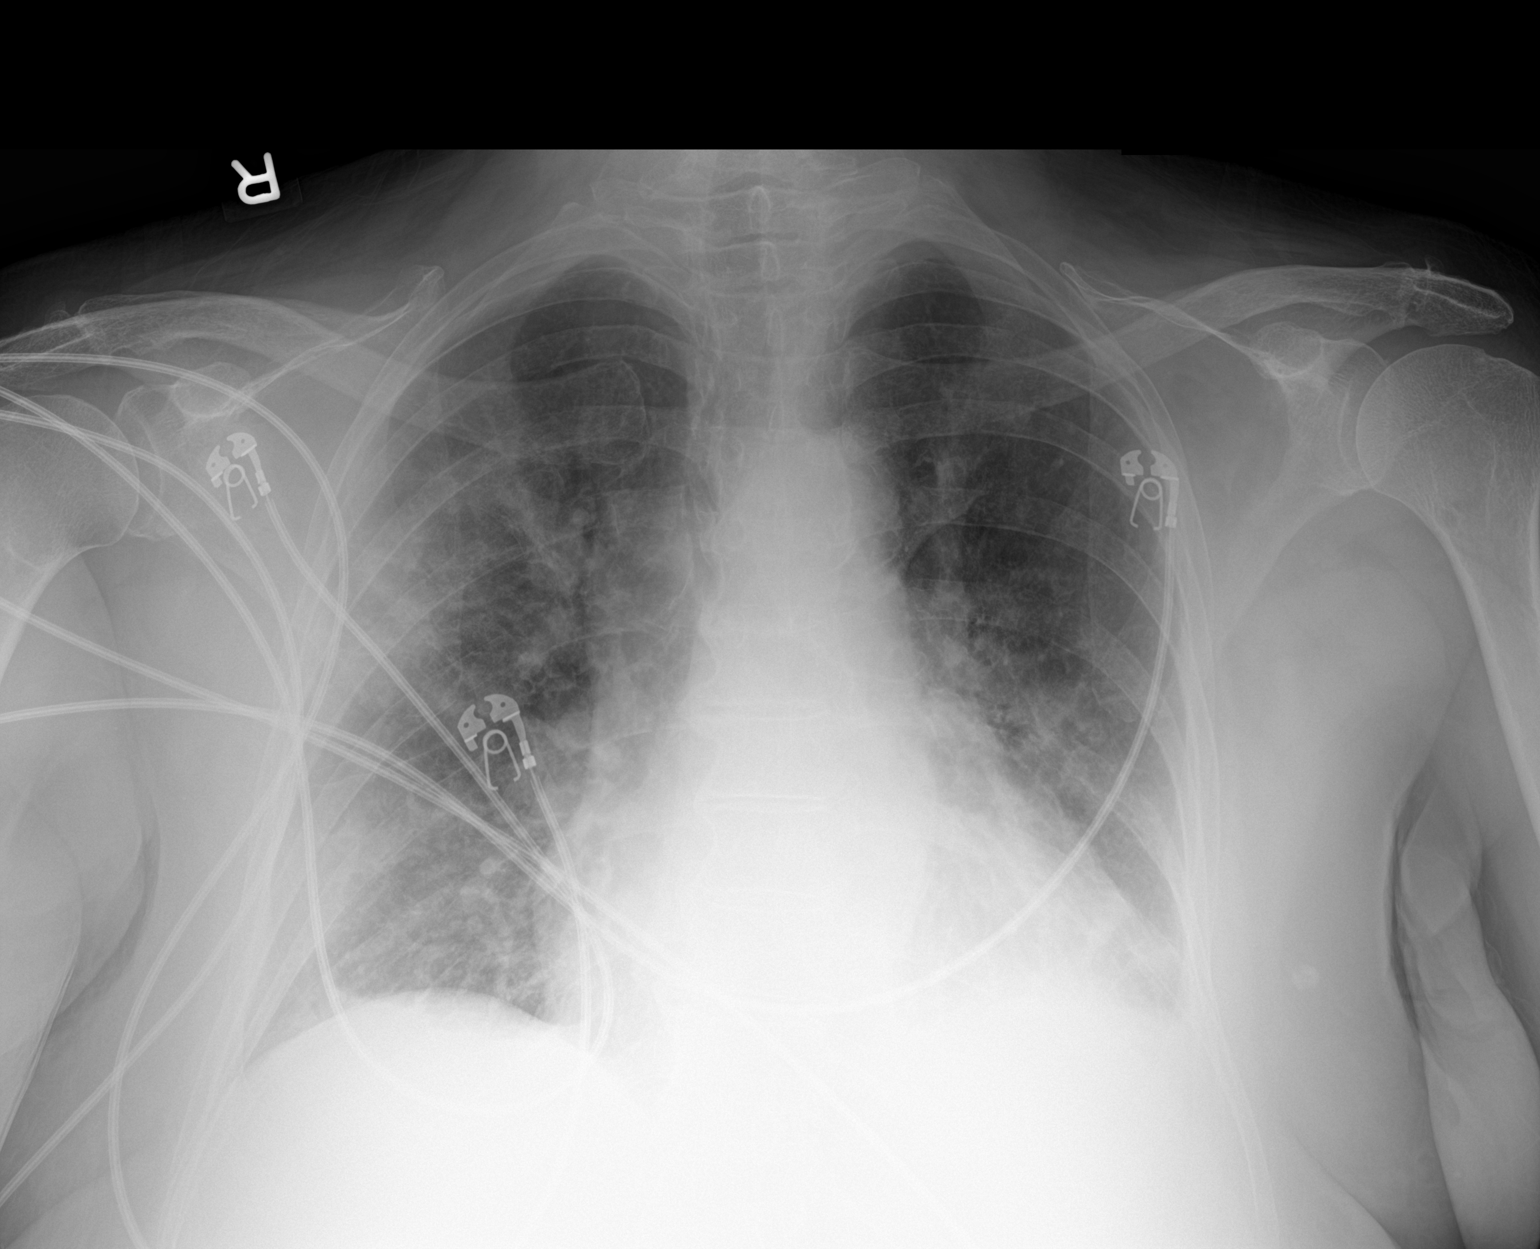

[1 of 1 positions shown; findings below may reference images not displayed]

FINDINGS: Stable cardiac silhouette. Normal increase in mild airspace disease.
Mild increased central venous congestion. Small RIGHT effusion.
IMPRESSION: Subtle increase in pulmonary airspace disease. Findings concerning
for early pneumonia versus pulmonary edema.

## 2021-06-03 ENCOUNTER — Ambulatory Visit: Payer: Medicare Other | Admitting: Physician Assistant

## 2021-06-16 NOTE — Progress Notes (Unsigned)
Office Visit    Patient Name: Tasha [name] Date of Encounter: 06/17/2021  Primary Care Provider:  Lavone Orn, MD Primary Cardiologist:  Sanda Klein, MD  Chief Complaint    81 year old female with a history of paroxysmal atrial tachycardia, permanent atrial fibrillation, syncope, chronic diastolic heart failure, prediabetes, CLL with mild thrombocytopenia, multiple sclerosis, ocular migraines, abnormal thyroid tests, and idiopathic peripheral neuropathy who presents for follow-up related to syncope and atrial fibrillation.  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   IFG (impaired fasting glucose)    e   Personal history of other diseases of circulatory system    Tick bite    Unspecified hereditary and idiopathic peripheral neuropathy    Vitamin B12 deficiency    Vitamin D deficiency    Past Surgical History:  Procedure Laterality Date   ABDOMINAL HYSTERECTOMY  1984   fibroids   BREAST SURGERY  1960s   benign breast tumors   CARDIOVERSION N/A 05/15/2020   Procedure: CARDIOVERSION;  Surgeon: Sanda Klein, MD;  Location: [facility];  Service: Cardiovascular;  Laterality: N/A;   CARDIOVERSION N/A 07/02/2020   Procedure: CARDIOVERSION;  Surgeon: Thayer Headings, MD;  Location: [facility];  Service: Cardiovascular;  Laterality: N/A;   CATARACT EXTRACTION W/ INTRAOCULAR LENS  IMPLANT, BILATERAL     LAPAROSCOPIC BILATERAL SALPINGO OOPHERECTOMY  2003   ovarian cyst    Allergies  Allergies  Allergen Reactions   Clindamycin Itching and Rash   Celebrex [Celecoxib] Other (See Comments)    Hypertension, possible TIA   Statins Other (See  Comments)    Muscle aches   Tape     Patient CAN tolerate Coban Wrap only (NO TAPE!!)   Tricor [Fenofibrate] Other (See Comments)    Elevated liver enzymes   Amoxicillin Rash   Clarithromycin Rash   Latex Rash   Methimazole Rash    History of Present Illness    81 year old female with the above past medical history including paroxysmal atrial tachycardia, permanent atrial fibrillation, syncope, chronic diastolic heart failure, prediabetes, CLL with mild thrombocytopenia, multiple sclerosis, ocular migraines, abnormal thyroid tests, and idiopathic peripheral neuropathy.  She has a long history of paroxysmal atrial fibrillation, however, she developed persistent atrial fibrillation following COVID-19 infection with pneumonia and transient hyperthyroidism in March 2022.  Initial cardioversion failed and she was started on flecainide.  She did have hallucinogenic side effects with flecainide.  She had another cardioversion in 06/2020, but had early recurrence of atrial fibrillation thus it was subsequently decided adopt a rate control strategy for her atrial fibrillation (she was hesitant to start amiodarone given underlying thyroid condition and did not want to be hospitalized for Tikosyn).  She has been managed on a combination of metoprolol and digoxin (however, digoxin was discontinued at most recent visit in the setting of slow ventricular rate). She is on Eliquis for anticoagulation. Most recent echocardiogram in March 2022 showed EF 65 to 70%, mildly dilated left atrium.  She had a syncopal event in June 2022 with loss of consciousness.  She was last in the office on 09/25/2020 and was stable from a cardiac standpoint.  Loop recorder was recommended due to prior syncope, concerning for arrhythmic event, however, she declined.    She presents today for follow-up.  Since her last visit she has been stable overall from a cardiac standpoint.  She does report an increase in personal stress as her husband  has had some recent cognitive changes concerning for dementia, she also cares for her 35 year old grandson. She notes some mild dyspnea on exertion, however, this is unchanged. She denies worsening edema, weight gain, PND, orthopnea.  She denies any recurrent presyncope or syncope. Overall, she reports feeling well denies any new concerns today.  Home Medications    Current Outpatient Medications  Medication Sig Dispense Refill   acetaminophen (TYLENOL) 500 MG tablet Take 500-1,000 mg by mouth 2 (two) times daily as needed (pain.).     apixaban (ELIQUIS) 5 MG TABS tablet Take 1 tablet twice Daily 180 tablet 1   cyanocobalamin 1000 MCG tablet Take 1,000 mcg by mouth daily.     furosemide (LASIX) 40 MG tablet Take 1 tablet (40 mg total) by mouth daily. Do not take if your weight is less than 195 lbs 30 tablet 5   metoprolol tartrate (LOPRESSOR) 100 MG tablet TAKE 1 TABLET(100 MG) BY MOUTH TWICE DAILY 180 tablet 3   Polyethyl Glycol-Propyl Glycol (SYSTANE) 0.4-0.3 % SOLN Place 1-2 drops into both eyes in the morning and at bedtime.     potassium chloride SA (KLOR-CON M20) 20 MEQ tablet Take 1 tablet (20 mEq total) by mouth See admin instructions. Do not take if your weight is less than 195 lbs (Patient taking differently: Take 20 mEq by mouth daily as needed. Do not take if your weight is less than 195 lbs) 90 tablet 3   VITAMIN D, CHOLECALCIFEROL, PO Take 1,000 Units by mouth daily.     No current facility-administered medications for this visit.     Review of Systems    She denies chest pain, palpitations, pnd, orthopnea, n, v, dizziness, syncope, edema, weight gain, or early satiety. All other systems reviewed and are otherwise negative except as noted above.   Physical Exam    VS:  BP 114/72   Pulse 86   Ht '5\' 6"'$  (1.676 m)   Wt 214 lb (97.1 kg)   SpO2 95%   BMI 34.54 kg/m   GEN: Well nourished, well developed, in no acute distress. HEENT: normal. Neck: Supple, no JVD, carotid  bruits, or masses. Cardiac: IRIR, no murmurs, rubs, or gallops. No clubbing, cyanosis, edema.  Radials/DP/PT 2+ and equal bilaterally.  Respiratory:  Respirations regular and unlabored, clear to auscultation bilaterally. GI: Soft, nontender, nondistended, BS + x 4. MS: no deformity or atrophy. Skin: warm and dry, no rash. Neuro:  Strength and sensation are intact. Psych: Normal affect.  Accessory Clinical Findings    ECG personally reviewed by me today - Atrial fibrillation, 86 bpm - no acute changes.  Lab Results  Component Value Date   WBC 5.2 10/10/2020   HGB 13.4 10/10/2020   HCT 39.3 10/10/2020   MCV 83.6 10/10/2020   PLT 106 (L) 10/10/2020   Lab Results  Component Value Date   CREATININE 0.75 10/10/2020   BUN 19 10/10/2020   NA 140 10/10/2020   K 4.1 10/10/2020   CL 108 10/10/2020   CO2 24 10/10/2020   Lab Results  Component Value Date   ALT 9 10/10/2020   AST 10 (L) 10/10/2020   ALKPHOS 79 10/10/2020   BILITOT 1.0 10/10/2020  No results found for: CHOL, HDL, LDLCALC, LDLDIRECT, TRIG, CHOLHDL  Lab Results  Component Value Date   HGBA1C 6.1 (H) 04/08/2020    Assessment & Plan    1. Permanent atrial fibrillation: Failed multiple attempts at cardioversion, even when on antiarrhythmic medications.  Now rate controlled. Digoxin was discontinued in the setting of slow ventricular response.  EKG today shows atrial fibrillation, 86 bpm.  Denies bleeding on Eliquis. Continue Eliquis, metoprolol (CHA2DS2VASc = 3).  2. H/o syncope: Loop recorder was previously recommended, however, she declined.  She denies any recurrent syncope, denies presyncope, palpitations.  No indication for further work-up at this time.  3. Chronic diastolic heart failure: Most recent echo in March 2022 showed EF 65 to 70%, mildly dilated left atrium.  Euvolemic and well compensated on exam.  Continue metoprolol, Lasix.   4. 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   5. Prediabetes: A1c was 6.1 in 03/2020. Monitored and managed per PCP.   6. CLL: With h/o mild stable thrombocytopenia.  Platelets were 106,000 in 09/2020.  Follows with oncology.  7. Disposition: Follow-up in 6 months.  Lenna Sciara, NP 06/17/2021, 9:37 AM

## 2021-06-17 ENCOUNTER — Ambulatory Visit (INDEPENDENT_AMBULATORY_CARE_PROVIDER_SITE_OTHER): Payer: Medicare Other | Admitting: Nurse Practitioner

## 2021-06-17 ENCOUNTER — Encounter: Payer: Self-pay | Admitting: Nurse Practitioner

## 2021-06-17 VITALS — BP 114/72 | HR 86 | Ht 66.0 in | Wt 214.0 lb

## 2021-06-17 DIAGNOSIS — R946 Abnormal results of thyroid function studies: Secondary | ICD-10-CM

## 2021-06-17 DIAGNOSIS — I4821 Permanent atrial fibrillation: Secondary | ICD-10-CM | POA: Diagnosis not present

## 2021-06-17 DIAGNOSIS — R7303 Prediabetes: Secondary | ICD-10-CM

## 2021-06-17 DIAGNOSIS — I4819 Other persistent atrial fibrillation: Secondary | ICD-10-CM

## 2021-06-17 DIAGNOSIS — R55 Syncope and collapse: Secondary | ICD-10-CM

## 2021-06-17 DIAGNOSIS — C9110 Chronic lymphocytic leukemia of B-cell type not having achieved remission: Secondary | ICD-10-CM

## 2021-06-17 DIAGNOSIS — I5032 Chronic diastolic (congestive) heart failure: Secondary | ICD-10-CM | POA: Diagnosis not present

## 2021-06-17 NOTE — Patient Instructions (Signed)
Medication Instructions:  Your physician recommends that you continue on your current medications as directed. Please refer to the Current Medication list given to you today.   *If you need a refill on your cardiac medications before your next appointment, please call your pharmacy*   Lab Work: NONE ordered at this time of appointment   If you have labs (blood work) drawn today and your tests are completely normal, you will receive your results only by: MyChart Message (if you have MyChart) OR A paper copy in the mail If you have any lab test that is abnormal or we need to change your treatment, we will call you to review the results.   Testing/Procedures: NONE ordered at this time of appointment     Follow-Up: At [facility], you and your health needs are our priority.  As part of our continuing mission to provide you with exceptional heart care, we have created designated Provider Care Teams.  These Care Teams include your primary Cardiologist (physician) and Advanced Practice Providers (APPs -  Physician Assistants and Nurse Practitioners) who all work together to provide you with the care you need, when you need it.  We recommend signing up for the patient portal called "MyChart".  Sign up information is provided on this After Visit Summary.  MyChart is used to connect with patients for Virtual Visits (Telemedicine).  Patients are able to view lab/test results, encounter notes, upcoming appointments, etc.  Non-urgent messages can be sent to your provider as well.   To learn more about what you can do with MyChart, go to [email or web address].    Your next appointment:   6 month(s)  The format for your next appointment:   In Person  Provider:   Mihai Croitoru, MD     Other Instructions   Important Information About Sugar       

## 2021-08-14 ENCOUNTER — Other Ambulatory Visit: Payer: Self-pay

## 2021-08-14 ENCOUNTER — Emergency Department (HOSPITAL_COMMUNITY)
Admission: EM | Admit: 2021-08-14 | Discharge: 2021-08-14 | Disposition: A | Discharge status: Home / Self Care | Payer: Medicare Other | Attending: Emergency Medicine | Admitting: Emergency Medicine

## 2021-08-14 ENCOUNTER — Emergency Department (HOSPITAL_COMMUNITY): Payer: Medicare Other

## 2021-08-14 DIAGNOSIS — R9431 Abnormal electrocardiogram [ECG] [EKG]: Secondary | ICD-10-CM | POA: Diagnosis not present

## 2021-08-14 DIAGNOSIS — M549 Dorsalgia, unspecified: Secondary | ICD-10-CM | POA: Diagnosis not present

## 2021-08-14 DIAGNOSIS — W01198A Fall on same level from slipping, tripping and stumbling with subsequent striking against other object, initial encounter: Secondary | ICD-10-CM | POA: Insufficient documentation

## 2021-08-14 DIAGNOSIS — I4891 Unspecified atrial fibrillation: Secondary | ICD-10-CM | POA: Insufficient documentation

## 2021-08-14 DIAGNOSIS — Z7901 Long term (current) use of anticoagulants: Secondary | ICD-10-CM | POA: Insufficient documentation

## 2021-08-14 DIAGNOSIS — Z91040 Latex allergy status: Secondary | ICD-10-CM | POA: Diagnosis not present

## 2021-08-14 DIAGNOSIS — S0990XA Unspecified injury of head, initial encounter: Secondary | ICD-10-CM | POA: Diagnosis not present

## 2021-08-14 DIAGNOSIS — R739 Hyperglycemia, unspecified: Secondary | ICD-10-CM | POA: Insufficient documentation

## 2021-08-14 DIAGNOSIS — E162 Hypoglycemia, unspecified: Secondary | ICD-10-CM | POA: Diagnosis not present

## 2021-08-14 DIAGNOSIS — E161 Other hypoglycemia: Secondary | ICD-10-CM | POA: Diagnosis not present

## 2021-08-14 DIAGNOSIS — W19XXXA Unspecified fall, initial encounter: Secondary | ICD-10-CM

## 2021-08-14 DIAGNOSIS — R42 Dizziness and giddiness: Secondary | ICD-10-CM | POA: Diagnosis not present

## 2021-08-14 DIAGNOSIS — M545 Low back pain, unspecified: Secondary | ICD-10-CM | POA: Diagnosis not present

## 2021-08-14 DIAGNOSIS — Z043 Encounter for examination and observation following other accident: Secondary | ICD-10-CM | POA: Diagnosis not present

## 2021-08-14 LAB — CBG MONITORING, ED: Glucose-Capillary: 112 mg/dL — ABNORMAL HIGH (ref 70–99)

## 2021-08-14 NOTE — ED Triage Notes (Signed)
Pt to ED via [facility] after fall yesterday striking her head. Pt reports today feeling dizzy, nauseous and "head not feeling right." Pt is on Eliquis d/t hx of afib.

## 2021-08-14 NOTE — ED Provider Notes (Signed)
[facility] EMERGENCY DEPARTMENT Provider Note   CSN: [id number] Arrival date & time: 08/14/21  1706     History  Chief Complaint  Patient presents with   Lytle Michaels    Tasha [name] is a 81 y.o. female.   Fall  Patient presents after fall.  Foot got stuck and fell backwards last night striking her head.  Is on Eliquis for atrial fibrillation.  Today feels little dizzy nauseous and has a dull headache.  Felt a little nauseous but thinks it may have been to her sugar it was a little low.  States that is improved since she has eaten some food.  No neck pain.  No numbness weakness.  No confusion.    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 Authorizing Provider  acetaminophen (TYLENOL) 500 MG tablet Take 500-1,000 mg by mouth 2 (two) times daily as needed (pain.).    [provider]  apixaban (ELIQUIS) 5 MG TABS tablet Take 1 tablet twice Daily 04/18/21   Croitoru, Mihai, MD  cyanocobalamin 1000 MCG tablet Take 1,000 mcg by mouth daily.    [provider]  furosemide (LASIX) 40 MG tablet Take 1 tablet (40 mg total) by mouth daily. Do not take if your weight is less than 195 lbs 06/13/20 06/17/21  Croitoru, Mihai, MD  metoprolol tartrate (LOPRESSOR) 100 MG tablet TAKE 1 TABLET(100 MG) BY MOUTH TWICE DAILY 05/08/21   Croitoru, Mihai, MD   Polyethyl Glycol-Propyl Glycol (SYSTANE) 0.4-0.3 % SOLN Place 1-2 drops into both eyes in the morning and at bedtime.    [provider]  potassium chloride SA (KLOR-CON M20) 20 MEQ tablet Take 1 tablet (20 mEq total) by mouth See admin instructions. Do not take if your weight is less than 195 lbs Patient taking differently: Take 20 mEq by mouth daily as needed. Do not take if your weight is less than 195 lbs 07/01/20 06/17/21  Shamleffer, Melanie Crazier, MD  VITAMIN D, CHOLECALCIFEROL, PO Take 1,000 Units by mouth daily.    [provider]      Allergies    Clindamycin, Celebrex [celecoxib], Statins, Tape, Tricor [fenofibrate], Amoxicillin, Clarithromycin, Latex, and Methimazole    Review of Systems   Review of Systems  Physical Exam Updated Vital Signs BP 113/80   Pulse 89   Temp 97.9 F (36.6 C) (Oral)   Resp (!) 22   Wt 96.1 kg   SpO2 96%   BMI 34.20 kg/m  Physical Exam Vitals and nursing note reviewed.  HENT:     Head: Atraumatic.     Mouth/Throat:     Mouth: Mucous membranes are moist.  Eyes:     Extraocular Movements: Extraocular movements intact.     Pupils: Pupils are equal, round, and reactive to light.  Cardiovascular:     Rate and Rhythm: Normal rate.  Chest:     Chest wall: No tenderness.  Abdominal:     Tenderness: There is no abdominal tenderness.  Musculoskeletal:        General: No tenderness.  Skin:    General: Skin is warm.  Neurological:     Mental Status: She is alert and oriented to person, place, and time.     ED Results / Procedures / Treatments   Labs (all labs ordered are listed, but only abnormal results are displayed) Labs Reviewed  CBG MONITORING, ED - Abnormal; Notable for the following components:      Result Value   Glucose-Capillary 112 (*)    All other components within normal limits    EKG None  Radiology CT HEAD WO CONTRAST (5MM)  Result Date: 08/14/2021 CLINICAL DATA:  Fall EXAM: CT HEAD WITHOUT  CONTRAST TECHNIQUE: Contiguous axial images were obtained from the base of the skull through the vertex without intravenous contrast. RADIATION DOSE REDUCTION: This exam was performed according to the departmental dose-optimization program which includes automated exposure control, adjustment of the mA and/or kV according to patient size and/or use of iterative reconstruction technique. COMPARISON:  None Available. FINDINGS: Brain: No evidence of acute infarction, hemorrhage, mass, mass effect, or midline shift. No hydrocephalus or extra-axial fluid collection. Vascular: No hyperdense vessel. Skull: Hyperostosis.  Negative for fracture or focal lesion. Sinuses/Orbits: No acute finding. Other: The mastoid air cells are well aerated. IMPRESSION: No acute intracranial process. Electronically Signed   By: Merilyn Baba M.D.   On: 08/14/2021 18:46    Procedures Procedures    Medications Ordered in ED Medications - No data to display  ED Course/ Medical Decision Making/ A&P                           Medical Decision Making Amount and/or Complexity of Data Reviewed Radiology: ordered.   Patient with fall last night.  Hit head.  And on anticoagulation.  Feeling a little worse today with dizziness and nauseousness.  There is obviously concern for intracranial hemorrhage due to trauma on anticoagulation.  Will get head CT.  Also sugar was reportedly a little low at home.  Will recheck in still low may need more blood work. Head CT done and independently interpreted.  No bleed.  Potentially could have concussion would not show up on the CT scan.  Head injury instructions given.  Sugar reassuring.  Appears stable for discharge home.        Final Clinical Impression(s) / ED Diagnoses Final diagnoses:  Fall, initial encounter  Minor head injury, initial encounter    Rx / DC Orders ED Discharge Orders     None         Davonna Belling, MD 08/14/21 2334

## 2021-10-09 ENCOUNTER — Other Ambulatory Visit: Payer: Self-pay | Admitting: Hematology and Oncology

## 2021-10-09 ENCOUNTER — Inpatient Hospital Stay: Payer: Medicare Other

## 2021-10-09 ENCOUNTER — Inpatient Hospital Stay: Payer: Medicare Other | Admitting: Hematology and Oncology

## 2021-10-09 DIAGNOSIS — C9110 Chronic lymphocytic leukemia of B-cell type not having achieved remission: Secondary | ICD-10-CM

## 2021-10-29 ENCOUNTER — Inpatient Hospital Stay (HOSPITAL_BASED_OUTPATIENT_CLINIC_OR_DEPARTMENT_OTHER): Payer: Medicare Other | Admitting: Hematology and Oncology

## 2021-10-29 ENCOUNTER — Inpatient Hospital Stay: Payer: Medicare Other | Attending: Hematology and Oncology

## 2021-10-29 ENCOUNTER — Other Ambulatory Visit: Payer: Self-pay

## 2021-10-29 VITALS — BP 128/62 | HR 95 | Temp 98.1°F | Resp 17 | Wt 207.6 lb

## 2021-10-29 DIAGNOSIS — G35 Multiple sclerosis: Secondary | ICD-10-CM | POA: Diagnosis not present

## 2021-10-29 DIAGNOSIS — Z8616 Personal history of COVID-19: Secondary | ICD-10-CM | POA: Diagnosis not present

## 2021-10-29 DIAGNOSIS — C9110 Chronic lymphocytic leukemia of B-cell type not having achieved remission: Secondary | ICD-10-CM | POA: Insufficient documentation

## 2021-10-29 DIAGNOSIS — I4891 Unspecified atrial fibrillation: Secondary | ICD-10-CM | POA: Insufficient documentation

## 2021-10-29 LAB — CBC WITH DIFFERENTIAL (CANCER CENTER ONLY)
Abs Immature Granulocytes: 0.05 10*3/uL (ref 0.00–0.07)
Basophils Absolute: 0 10*3/uL (ref 0.0–0.1)
Basophils Relative: 0 %
Eosinophils Absolute: 0.3 10*3/uL (ref 0.0–0.5)
Eosinophils Relative: 5 %
HCT: 41.3 % (ref 36.0–46.0)
Hemoglobin: 13.8 g/dL (ref 12.0–15.0)
Immature Granulocytes: 1 %
Lymphocytes Relative: 31 %
Lymphs Abs: 2.3 10*3/uL (ref 0.7–4.0)
MCH: 28.2 pg (ref 26.0–34.0)
MCHC: 33.4 g/dL (ref 30.0–36.0)
MCV: 84.3 fL (ref 80.0–100.0)
Monocytes Absolute: 0.8 10*3/uL (ref 0.1–1.0)
Monocytes Relative: 11 %
Neutro Abs: 3.8 10*3/uL (ref 1.7–7.7)
Neutrophils Relative %: 52 %
Platelet Count: 181 10*3/uL (ref 150–400)
RBC: 4.9 MIL/uL (ref 3.87–5.11)
RDW: 14.6 % (ref 11.5–15.5)
WBC Count: 7.3 10*3/uL (ref 4.0–10.5)
nRBC: 0 % (ref 0.0–0.2)

## 2021-10-29 LAB — CMP (CANCER CENTER ONLY)
ALT: 7 U/L (ref 0–44)
AST: 11 U/L — ABNORMAL LOW (ref 15–41)
Albumin: 3.9 g/dL (ref 3.5–5.0)
Alkaline Phosphatase: 68 U/L (ref 38–126)
Anion gap: 6 (ref 5–15)
BUN: 14 mg/dL (ref 8–23)
CO2: 28 mmol/L (ref 22–32)
Calcium: 9.2 mg/dL (ref 8.9–10.3)
Chloride: 106 mmol/L (ref 98–111)
Creatinine: 0.68 mg/dL (ref 0.44–1.00)
GFR, Estimated: >60 mL/min (ref >60)
Glucose, Bld: 124 mg/dL — ABNORMAL HIGH (ref 70–99)
Potassium: 4.4 mmol/L (ref 3.5–5.1)
Sodium: 140 mmol/L (ref 135–145)
Total Bilirubin: 0.8 mg/dL (ref 0.3–1.2)
Total Protein: 6.3 g/dL — ABNORMAL LOW (ref 6.5–8.1)

## 2021-10-29 NOTE — Progress Notes (Signed)
[place] [phone]   [phone]  PROGRESS NOTE  Patient Care Team: Lavone Orn, MD as PCP - General (Internal Medicine) Croitoru, Dani Gobble, MD as PCP - Cardiology (Cardiology) Croitoru, Dani Gobble, MD as Consulting Physician (Cardiology) Magrinat, Virgie Dad, MD (Inactive) as Consulting Physician (Oncology) Sabas Sous, MD as Referring Physician (Internal Medicine)  Hematological/Oncological History # CLL Rai Stage 0 10/10/2020: last visit with Dr. Jana Hakim 10/29/2021: establish care with Dr. Lorenso Courier   Interval History:  Tasha [name] 81 y.o. female with medical history significant for CLL Rai Stage 0 who presents for a follow up visit. The patient's last visit was on 10/10/2021 with Dr. Jana Hakim. In the interim since the last visit she has had no major changes in her health.  On exam today Tasha [name] reports that she has never undergone any treatment for her CLL.  She notes that she was originally followed by Dr. Laurance Flatten at [facility] and then subsequently transferred to Dr. Jana Hakim.  Since Dr. Jana Hakim retired we are now assuming her care.  Patient reports that she did have COVID and that she feels more tired ever since.  She also reports that she has more sweating since she had that infection.  She does have atrial fibrillation but is otherwise quite healthy.  She does that she has not been having any trouble with bleeding, bruising, or dark stools.  She denies any enlarged lymph nodes.  She notes that she is easily fatigued but is able to perform all her day-to-day tasks without any difficulty.  She denies any fevers, chills, sweats, nausea, vomiting or diarrhea.  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   IFG (impaired fasting glucose)    e   Personal history of other diseases of circulatory system    Tick bite    Unspecified hereditary and idiopathic peripheral neuropathy    Vitamin B12 deficiency    Vitamin D deficiency     SURGICAL HISTORY: Past Surgical History:  Procedure Laterality Date   ABDOMINAL HYSTERECTOMY  1984   fibroids   BREAST SURGERY  1960s   benign breast tumors   CARDIOVERSION N/A 05/15/2020   Procedure: CARDIOVERSION;  Surgeon: Sanda Klein, MD;  Location: [facility];  Service: Cardiovascular;  Laterality: N/A;   CARDIOVERSION N/A 07/02/2020   Procedure: CARDIOVERSION;  Surgeon: Thayer Headings, MD;  Location: [facility];  Service: Cardiovascular;  Laterality: N/A;   CATARACT EXTRACTION W/ INTRAOCULAR LENS  IMPLANT, BILATERAL     LAPAROSCOPIC BILATERAL SALPINGO OOPHERECTOMY  2003   ovarian cyst    SOCIAL HISTORY: Social History   Socioeconomic History   Marital status: Married    Spouse name: Designer, industrial/product   Number of children: 3   Years of education: college   Highest education level: Not on file  Occupational History    Comment: Retired  Tobacco Use   Smoking status: Never   Smokeless tobacco: Never  Substance and Sexual Activity   Alcohol use: No    Alcohol/week: 0.0 standard drinks of alcohol   Drug use: No   Sexual activity: Not on file  Other Topics Concern   Not  on file  Social History Narrative   Patient is married Animal nutritionist). Patient is retired . Patient has college education.    Right handed.   Caffeine- not every day . Soda or tea when she is out for dinner.   Social Determinants of Health   Financial Resource Strain: Not on file  Food Insecurity: Not on file  Transportation Needs: Not on file  Physical Activity: Not on file  Stress: Not on file  Social Connections: Not on file  Intimate Partner Violence: Not on file    FAMILY  HISTORY: Family History  Problem Relation Age of Onset   Hypertension Mother    Coronary artery disease Father        died at 28   Hypertension Father    Diabetes Father    Coronary artery disease Brother        died at 14   Diabetes Brother    Hypertension Brother    Cancer Daughter     ALLERGIES:  is allergic to clindamycin, celebrex [celecoxib], statins, tape, tricor [fenofibrate], amoxicillin, clarithromycin, latex, and methimazole.  MEDICATIONS:  Current Outpatient Medications  Medication Sig Dispense Refill   acetaminophen (TYLENOL) 500 MG tablet Take 500-1,000 mg by mouth 2 (two) times daily as needed (pain.).     apixaban (ELIQUIS) 5 MG TABS tablet Take 1 tablet twice Daily 180 tablet 1   cyanocobalamin 1000 MCG tablet Take 1,000 mcg by mouth daily.     furosemide (LASIX) 40 MG tablet Take 1 tablet (40 mg total) by mouth daily. Do not take if your weight is less than 195 lbs 30 tablet 5   metoprolol tartrate (LOPRESSOR) 100 MG tablet TAKE 1 TABLET(100 MG) BY MOUTH TWICE DAILY 180 tablet 3   Polyethyl Glycol-Propyl Glycol (SYSTANE) 0.4-0.3 % SOLN Place 1-2 drops into both eyes in the morning and at bedtime.     potassium chloride SA (KLOR-CON M20) 20 MEQ tablet Take 1 tablet (20 mEq total) by mouth See admin instructions. Do not take if your weight is less than 195 lbs (Patient taking differently: Take 20 mEq by mouth daily as needed. Do not take if your weight is less than 195 lbs) 90 tablet 3   VITAMIN D, CHOLECALCIFEROL, PO Take 1,000 Units by mouth daily.     No current facility-administered medications for this visit.    REVIEW OF SYSTEMS:   Constitutional: ( - ) fevers, ( - )  chills , ( - ) night sweats Eyes: ( - ) blurriness of vision, ( - ) double vision, ( - ) watery eyes Ears, nose, mouth, throat, and face: ( - ) mucositis, ( - ) sore throat Respiratory: ( - ) cough, ( - ) dyspnea, ( - ) wheezes Cardiovascular: ( - ) palpitation, ( - ) chest discomfort, ( - )  lower extremity swelling Gastrointestinal:  ( - ) nausea, ( - ) heartburn, ( - ) change in bowel habits Skin: ( - ) abnormal skin rashes Lymphatics: ( - ) new lymphadenopathy, ( - ) easy bruising Neurological: ( - ) numbness, ( - ) tingling, ( - ) new weaknesses Behavioral/Psych: ( - ) mood change, ( - ) new changes  All other systems were reviewed with the patient and are negative.  PHYSICAL EXAMINATION: ECOG PERFORMANCE STATUS: 0 - Asymptomatic  Vitals:   10/29/21 1143  BP: 128/62  Pulse: 95  Resp: 17  Temp: 98.1 F (36.7 C)  SpO2: 96%   Filed Weights   10/29/21 1143  Weight: 207 lb 9.6 oz (94.2 kg)    GENERAL: Well-appearing elderly Caucasian female, alert, no distress and comfortable SKIN: skin color, texture, turgor are normal, no rashes or significant lesions EYES: conjunctiva are pink and non-injected, sclera clear NECK: supple, non-tender LYMPH:  no palpable lymphadenopathy in the cervical, axillary or inguinal LUNGS: clear to auscultation and percussion with normal breathing effort HEART: regular rate & rhythm and no murmurs and no lower extremity edema Musculoskeletal: no cyanosis of digits and no clubbing  PSYCH: alert & oriented x 3, fluent speech NEURO: no focal motor/sensory deficits  LABORATORY DATA:  I have reviewed the data as listed    Latest Ref Rng & Units 10/29/2021    9:58 AM 10/10/2020   11:47 AM 07/04/2020   10:11 AM  CBC  WBC 4.0 - 10.5 K/uL 7.3  5.2  3.4   Hemoglobin 12.0 - 15.0 g/dL 13.8  13.4  13.8   Hematocrit 36.0 - 46.0 % 41.3  39.3  43.3   Platelets 150 - 400 K/uL 181  106  90        Latest Ref Rng & Units 10/29/2021    9:58 AM 10/10/2020   11:47 AM 07/04/2020   10:11 AM  CMP  Glucose 70 - 99 mg/dL 124  115  144   BUN 8 - 23 mg/dL '14  19  13   '$ Creatinine 0.44 - 1.00 mg/dL 0.68  0.75  0.74   Sodium 135 - 145 mmol/L 140  140  143   Potassium 3.5 - 5.1 mmol/L 4.4  4.1  4.3   Chloride 98 - 111 mmol/L 106  108  109   CO2 22 - 32  mmol/L '28  24  25   '$ Calcium 8.9 - 10.3 mg/dL 9.2  9.6  9.0   Total Protein 6.5 - 8.1 g/dL 6.3  6.1  6.2   Total Bilirubin 0.3 - 1.2 mg/dL 0.8  1.0  0.6   Alkaline Phos 38 - 126 U/L 68  79  73   AST 15 - 41 U/L '11  10  13   '$ ALT 0 - 44 U/L '7  9  9     '$ RADIOGRAPHIC STUDIES: No results found.  ASSESSMENT & PLAN Tasha [name] 81 y.o. female with medical history significant for CLL Rai Stage 0 who presents for a follow up visit.   # CLL Rai Stage 0 --labs today show WBC 7.3, Hgb 13.80mMCV 84.3, Plt 181 --no evidence of lymphocytosis, disease has been stable x 21 years.  Patient has never required treatment. --patient is currently asymptomatic --RTC in 1 years time.    No orders of the defined types were placed in this encounter.   All questions were answered. The patient knows to call the clinic with any problems, questions or concerns.  A total of more than 40 minutes were spent on this encounter with face-to-face time and non-face-to-face time, including preparing to see the patient, ordering tests and/or medications, counseling the patient and coordination of care as outlined above.   JLedell Peoples MD Department of Hematology/Oncology [facility] [phone]: [phone]: [email or web address]  11/01/2021 5:05 PM

## 2021-11-01 ENCOUNTER — Encounter: Payer: Self-pay | Admitting: Oncology

## 2021-11-14 ENCOUNTER — Telehealth: Payer: Self-pay | Admitting: Cardiovascular Disease

## 2021-11-14 DIAGNOSIS — I4819 Other persistent atrial fibrillation: Secondary | ICD-10-CM

## 2021-11-14 MED ORDER — APIXABAN 5 MG PO TABS
ORAL_TABLET | ORAL | 1 refills | Status: DC
Start: 2021-11-14 — End: 2022-05-11

## 2021-11-14 NOTE — Telephone Encounter (Signed)
*  STAT* If patient is at the pharmacy, call can be transferred to refill team.   1. Which medications need to be refilled? (please list name of each medication and dose if known) apixaban (ELIQUIS) 5 MG TABS tablet   2. Which pharmacy/location (including street and city if local pharmacy) is medication to be sent to? [id number] - [place], [place] - [address]   3. Do they need a 30 day or 90 day supply? 90  

## 2021-11-14 NOTE — Telephone Encounter (Signed)
Prescription refill request for Eliquis received. Indication:afib  Last office visit: 06/17/2021 Scr: 0.68, 10/29/2021 Age: 81 yo   Weight: 94.2 kg   Refill sent.

## 2021-12-18 ENCOUNTER — Ambulatory Visit: Payer: Medicare Other | Admitting: Cardiovascular Disease

## 2022-01-02 ENCOUNTER — Ambulatory Visit: Payer: Medicare Other | Attending: Cardiovascular Disease | Admitting: Cardiovascular Disease

## 2022-01-02 ENCOUNTER — Encounter: Payer: Self-pay | Admitting: Cardiovascular Disease

## 2022-01-02 VITALS — BP 105/66 | HR 84 | Ht 67.0 in | Wt 215.6 lb

## 2022-01-02 DIAGNOSIS — R946 Abnormal results of thyroid function studies: Secondary | ICD-10-CM | POA: Insufficient documentation

## 2022-01-02 DIAGNOSIS — I5032 Chronic diastolic (congestive) heart failure: Secondary | ICD-10-CM | POA: Diagnosis not present

## 2022-01-02 DIAGNOSIS — R55 Syncope and collapse: Secondary | ICD-10-CM | POA: Insufficient documentation

## 2022-01-02 DIAGNOSIS — R7303 Prediabetes: Secondary | ICD-10-CM | POA: Diagnosis not present

## 2022-01-02 DIAGNOSIS — I4821 Permanent atrial fibrillation: Secondary | ICD-10-CM | POA: Diagnosis not present

## 2022-01-02 DIAGNOSIS — C9110 Chronic lymphocytic leukemia of B-cell type not having achieved remission: Secondary | ICD-10-CM | POA: Diagnosis not present

## 2022-01-02 DIAGNOSIS — E669 Obesity, unspecified: Secondary | ICD-10-CM | POA: Insufficient documentation

## 2022-01-02 DIAGNOSIS — I739 Peripheral vascular disease, unspecified: Secondary | ICD-10-CM | POA: Diagnosis not present

## 2022-01-02 NOTE — Patient Instructions (Signed)
Medication Instructions:  Your physician recommends that you continue on your current medications as directed. Please refer to the Current Medication list given to you today.  *If you need a refill on your cardiac medications before your next appointment, please call your pharmacy*   Lab Work: NONE If you have labs (blood work) drawn today and your tests are completely normal, you will receive your results only by: [place] (if you have MyChart) OR A paper copy in the mail If you have any lab test that is abnormal or we need to change your treatment, we will call you to review the results.   Testing/Procedures: Your physician has requested that you have a lower extremity arterial duplex. This test is an ultrasound of the arteries in the legs. It looks at arterial blood flow in the legs. Allow one hour for Lower Arterial scans. There are no restrictions or special instructions    Follow-Up: At [facility], you and your health needs are our priority.  As part of our continuing mission to provide you with exceptional heart care, we have created designated Provider Care Teams.  These Care Teams include your primary Cardiologist (physician) and Advanced Practice Providers (APPs -  Physician Assistants and Nurse Practitioners) who all work together to provide you with the care you need, when you need it.  We recommend signing up for the patient portal called "MyChart".  Sign up information is provided on this After Visit Summary.  MyChart is used to connect with patients for Virtual Visits (Telemedicine).  Patients are able to view lab/test results, encounter notes, upcoming appointments, etc.  Non-urgent messages can be sent to your provider as well.   To learn more about what you can do with MyChart, go to [email or web address].    Your next appointment:   1 year(s)  The format for your next appointment:   In Person  Provider:   Sanda Klein, MD

## 2022-01-02 NOTE — Progress Notes (Unsigned)
Cardiology Office Note:    Date:  01/04/2022   ID:  AKELA POCIUS, DOB [birth year], MRN [id number]  PCP:  Lavone Orn, MD  Cardiologist:  Sanda Klein, MD    Referring MD: Lavone Orn, MD   Chief complaint: atrial fibrillation   History of Present Illness:    Tasha [name] is a 81 y.o. female with a hx of persistent atrial fibrillation.  Additional medical problems include CLL with mild thrombocytopenia, multiple sclerosis with very infrequent problems, ocular migraines and idiopathic peripheral neuropathy.   She has not had any further syncope or falls since her last appointment.  She remains under a lot of emotional stress due to her husband's progressive dementia.  He has good days and bad days.  Sometimes he is verbally abusive, but he has never been physically violent.  Their 73 year old grandson also lives with them.  Unfortunately, his parents have their own psychosocial issues so Mendy is taking care of him.  He is a good Ship broker at [employer].  She had a syncopal event in July 9622, complicated by right foot fracture.  Also has multiple sclerosis and peripheral neuropathy.  She has mild exertional dyspnea, but never "heart race".  The patient specifically denies any chest pain at rest or with exertion, dyspnea at rest , orthopnea, paroxysmal nocturnal dyspnea, syncope, palpitations, focal neurological deficits, intermittent claudication, unexplained weight gain, cough, hemoptysis or wheezing.  She is worried about purplish discoloration of both feet.  Sometimes able blue and mottled.  They get better if she walks.  She does not have pain but sometimes has some burning neuropathic symptoms.    She does not have a lot of edema.  She has not taken any diuretics in over a year.  We had previously established her "dry weight" at 195 pounds, but she's probably gained weight since then.  She has not had any further falls and does not have any bleeding problems.  She is compliant with  Eliquis twice daily.  She has not experienced syncopal events before or since.  She struggled with paroxysmal atrial fibrillation for about 13 years, until she developed persistent atrial fibrillation after COVID-19 infection with pneumonia and transient hyperthyroidism in March 2022.  Initial cardioversion failed and she was started on flecainide.  The flecainide did cause some hallucination like side effects.  She underwent another cardioversion but had early recurrence of atrial fibrillation and subsequently decided to manage this with rate control (she was afraid of the interaction between amiodarone and her thyroid condition; did not want to do hospitalization for dofetilide).    He was hospitalized 03 17-03 25 2022with Covid pneumonia and hypoxemia, despite the fact that she had received 2 vaccine doses and a booster.  She was also hyponatremic during that admission and was found to be in persistent atrial fibrillation.  Rate control was challenging and digoxin was added to metoprolol.  TSH was suppressed and free T4 was mildly elevated so she was started on methimazole and is referred to see an endocrinologist (this will not happen until June 13).  An echocardiogram during that hospitalization showed that LVEF remains normal at 65 to 70%.  The left atrium was described as mildly dilated.  There was no significant valvular abnormality detected.  She underwent cardioversion on May 15, 2020.  Multiple shocks were administered, but each time she reverted to atrial fibrillation after just a few seconds in sinus rhythm.  She has developed edema of the lower extremities reaching up to the  knees bilaterally and has shortness of breath with usual activity (NYHA functional class II).  She finds that she coughs and feels a little short of breath if she lies on her left side and feels better when she turns over onto the right.  She has not had frank orthopnea or PND.  She is compliant with anticoagulation and has  not had any falls or serious bleeding problems.  She denies angina or syncope.  She is unaware of the arrhythmia.  She has noticed that her heart rate is slower now (digoxin and flecainide were both added recently).  She does not have known structural heart disease. She had a normal echo in 2013 at [facility] (normal LV systolic function, mild LVH with septal wall thickness 1.2 cm, borderline left atrial dilation at 3.9 cm, 25 cm). She does not have known coronary artery problems.  Follow-up echo March 2022 showed EF 65 to 70%, mildly dilated left atrium.  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   IFG (impaired fasting glucose)    e   Personal history of other diseases of circulatory system    Tick bite    Unspecified hereditary and idiopathic peripheral neuropathy    Vitamin B12 deficiency    Vitamin D deficiency     Past Surgical History:  Procedure Laterality Date   ABDOMINAL HYSTERECTOMY  1984   fibroids   BREAST SURGERY  1960s   benign breast tumors   CARDIOVERSION N/A 05/15/2020   Procedure: CARDIOVERSION;  Surgeon: Sanda Klein, MD;  Location: [facility];  Service: Cardiovascular;  Laterality: N/A;   CARDIOVERSION N/A 07/02/2020   Procedure: CARDIOVERSION;  Surgeon: Thayer Headings, MD;  Location: [facility];  Service: Cardiovascular;  Laterality: N/A;   CATARACT EXTRACTION W/ INTRAOCULAR LENS  IMPLANT, BILATERAL     LAPAROSCOPIC BILATERAL SALPINGO OOPHERECTOMY  2003   ovarian cyst    Current Medications: Current Meds  Medication Sig   apixaban (ELIQUIS) 5 MG TABS tablet Take 1 tablet twice Daily   cyanocobalamin 1000 MCG tablet Take 1,000 mcg by mouth daily.    metoprolol tartrate (LOPRESSOR) 100 MG tablet TAKE 1 TABLET(100 MG) BY MOUTH TWICE DAILY   Polyethyl Glycol-Propyl Glycol (SYSTANE) 0.4-0.3 % SOLN Place 1-2 drops into both eyes in the morning and at bedtime.   VITAMIN D, CHOLECALCIFEROL, PO Take 1,000 mcg by mouth daily.     Allergies:   Clindamycin, Celebrex [celecoxib], Statins, Tape, Tricor [fenofibrate], Amoxicillin, Clarithromycin, Latex, and Methimazole   Social History   Socioeconomic History   Marital status: Married    Spouse name: Designer, industrial/product   Number of children: 3   Years of education: college   Highest education level: Not on file  Occupational History    Comment: Retired  Tobacco Use   Smoking status: Never   Smokeless tobacco: Never  Substance and Sexual Activity   Alcohol use: No    Alcohol/week: 0.0 standard drinks of alcohol   Drug use: No   Sexual activity: Not on file  Other Topics Concern   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 Patient has college education.  Right handed.   Caffeine- not every day . Soda or tea when she is out for dinner.   Social Determinants of Health   Financial Resource Strain: Not on file  Food Insecurity: Not on file  Transportation Needs: Not on file  Physical Activity: Not on file  Stress: Not on file  Social Connections: Not on file     Family History: The patient's family history includes Cancer in her daughter; Coronary artery disease in her brother and father; Diabetes in her brother and father; Hypertension in her brother, father, and mother. ROS:   Please see the history of present illness.    All other systems are reviewed and are negative EKGs/Labs/Other Studies Reviewed:   11/01/2017 echo - Left ventricle: The cavity size was normal. There was severe   concentric hypertrophy. Systolic function was vigorous. The   estimated ejection fraction was in the range of 65% to 70%. Wall   motion was normal; there were no  regional wall motion   abnormalities. Doppler parameters are consistent with abnormal   left ventricular relaxation (grade 1 diastolic dysfunction). - Aortic valve: Trileaflet; mildly thickened leaflets. - Mitral valve: Moderately thickened, mildly calcified leaflets . - Right ventricle: Systolic function was normal. - Right atrium: The atrium was normal in size. - Tricuspid valve: There was mild regurgitation. - Pulmonary arteries: Systolic pressure was within the normal   range. - Pericardium, extracardiac: There was no pericardial effusion.  Echo 04/07/2018  1. Left ventricular ejection fraction, by estimation, is 65 to 70%. The  left ventricle has normal function. The left ventricle has no regional  wall motion abnormalities. Left ventricular diastolic function could not  be evaluated.   2. Right ventricular systolic function is mildly reduced. The right  ventricular size is mildly enlarged. There is mildly elevated pulmonary  artery systolic pressure. The estimated right ventricular systolic  pressure is 25.0 mmHg.   3. Left atrial size was mildly dilated.   4. Right atrial size was mildly dilated.   5. The mitral valve was not assessed. No evidence of mitral valve  regurgitation. No evidence of mitral stenosis.   6. The aortic valve was not assessed. Aortic valve regurgitation is not  visualized. No aortic stenosis is present.   7. The inferior vena cava is normal in size with greater than 50%  respiratory variability, suggesting right atrial pressure of 3 mmHg.   EKG:  EKG is ordered today.  Personally reviewed shows   Atrial fibrillation with controlled ventricular spots, QS pattern V1-V2, otherwise normal, QTc 408 ms Recent Labs: 10/29/2021: ALT 7; BUN 14; Creatinine 0.68; Hemoglobin 13.8; Platelet Count 181; Potassium 4.4; Sodium 140  Recent Lipid Panel No results found for: "CHOL", "TRIG", "HDL", "CHOLHDL", "VLDL", "LDLCALC", "LDLDIRECT"  06/25/2020 Cholesterol 163, HDL  53, LDL 75, triglycerides 79  Physical Exam:    VS:  BP 105/66 (BP Location: Left Arm, Patient Position: Sitting, Cuff Size: Large)   Pulse 84   Ht '5\' 7"'$  (1.702 m)   Wt 215 lb 9.6 oz (97.8 kg)   SpO2 97%   BMI 33.77 kg/m     Wt Readings from Last 3 Encounters:  01/02/22 215 lb 9.6 oz (97.8 kg)  10/29/21 207 lb 9.6 oz (94.2 kg)  08/14/21 211 lb 13.8 oz (96.1 kg)    General: Alert, oriented x3, no distress,  mildly obese Head: no evidence of trauma, PERRL, EOMI, no exophtalmos or lid lag, no myxedema, no xanthelasma; normal ears, nose and oropharynx Neck:  normal jugular venous pulsations and no hepatojugular reflux; brisk carotid pulses without delay and no carotid bruits Chest: clear to auscultation, no signs of consolidation by percussion or palpation, normal fremitus, symmetrical and full respiratory excursions Cardiovascular: normal position and quality of the apical impulse, irregular rhythm, normal first and second heart sounds, no murmurs, rubs or gallops Abdomen: no tenderness or distention, no masses by palpation, no abnormal pulsatility or arterial bruits, normal bowel sounds, no hepatosplenomegaly Extremities: no clubbing, cyanosis or edema; 2+ radial, ulnar and brachial pulses bilaterally; 2+ right femoral, posterior tibial and dorsalis pedis pulses; 2+ left femoral, posterior tibial and dorsalis pedis pulses; no subclavian or femoral bruits Neurological: grossly nonfocal Psych: Normal mood and affect     ASSESSMENT:    1. Permanent atrial fibrillation ([place])   2. Chronic diastolic heart failure ([place])   3. Syncope and collapse   4. Abnormal thyroid function test   5. Prediabetes   6. Mild obesity   7. CLL (chronic lymphocytic leukemia) ([place])   8. Claudication [facility])      PLAN:    In order of problems listed above:  AFib: Persistent now for over a year.  Failed multiple attempts at cardioversion, even when on antiarrhythmic medications.  Metoprolol alone has  proven to be sufficient for rate control.  Digoxin has been stopped.   CHADSVasc 3 (age 29, gender).  On Eliquis.  Chr diast HF: Once ventricular rate control was satisfactory, she no longer requires diuretics. Syncope: I was worried about arrhythmic syncope and recommended implantable loop recorder.  She has not had one implanted.  She has not had syncope again since. 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 Prediabetes: Hemoglobin A1c has not been rechecked since March 2022. Mild obesity: Weight loss would help with correction of metabolic abnormalities. CLL: She was transiently thrombocytopenic, but most recent labs show a platelet count of 181,000.  Not on treatment for CLL. Cyanosis of the lower extremities: I think she has pretty good pulses.  Suspect cyanosis is due to venous stasis, but will check ABI.    Patient Instructions  Medication Instructions:  Your physician recommends that you continue on your current medications as directed. Please refer to the Current Medication list given to you today.  *If you need a refill on your cardiac medications before your next appointment, please call your pharmacy*   Lab Work: NONE If you have labs (blood work) drawn today and your tests are completely normal, you will receive your results only by: [place] (if you have MyChart) OR A paper copy in the mail If you have any lab test that is abnormal or we need to change your treatment, we will call you to review the results.   Testing/Procedures: Your physician has requested that you have a lower extremity arterial duplex. This test is an ultrasound of the arteries in the legs. It looks at arterial blood flow in the legs. Allow one hour for Lower Arterial scans. There are no restrictions or special instructions    Follow-Up: At [facility], you and your health needs are our priority.  As part of our continuing mission to provide you with exceptional heart  care, we have created designated Provider Care Teams.  These Care Teams include your primary Cardiologist (physician) and Advanced Practice Providers (APPs -  Physician Assistants and Nurse Practitioners) who all work together to provide you with the care you need, when you need it.  We recommend signing up for the patient portal  called "MyChart".  Sign up information is provided on this After Visit Summary.  MyChart is used to connect with patients for Virtual Visits (Telemedicine).  Patients are able to view lab/test results, encounter notes, upcoming appointments, etc.  Non-urgent messages can be sent to your provider as well.   To learn more about what you can do with MyChart, go to [email or web address].    Your next appointment:   1 year(s)  The format for your next appointment:   In Person  Provider:   Sanda Klein, MD    Signed, Sanda Klein, MD  01/04/2022 6:01 PM    [place]

## 2022-01-06 ENCOUNTER — Other Ambulatory Visit: Payer: Self-pay | Admitting: Cardiovascular Disease

## 2022-01-06 DIAGNOSIS — I739 Peripheral vascular disease, unspecified: Secondary | ICD-10-CM

## 2022-01-21 ENCOUNTER — Ambulatory Visit (HOSPITAL_COMMUNITY)
Admission: RE | Admit: 2022-01-21 | Discharge: 2022-01-21 | Disposition: A | Discharge status: Home / Self Care | Payer: Medicare Other | Source: Ambulatory Visit | Attending: Cardiovascular Disease | Admitting: Cardiovascular Disease

## 2022-01-21 DIAGNOSIS — I739 Peripheral vascular disease, unspecified: Secondary | ICD-10-CM

## 2022-02-26 ENCOUNTER — Encounter (HOSPITAL_COMMUNITY): Payer: Self-pay | Admitting: *Deleted

## 2022-03-05 DIAGNOSIS — G609 Hereditary and idiopathic neuropathy, unspecified: Secondary | ICD-10-CM | POA: Diagnosis not present

## 2022-03-05 DIAGNOSIS — R739 Hyperglycemia, unspecified: Secondary | ICD-10-CM | POA: Diagnosis not present

## 2022-03-05 DIAGNOSIS — Z5181 Encounter for therapeutic drug level monitoring: Secondary | ICD-10-CM | POA: Diagnosis not present

## 2022-03-05 DIAGNOSIS — E782 Mixed hyperlipidemia: Secondary | ICD-10-CM | POA: Diagnosis not present

## 2022-03-05 DIAGNOSIS — C9110 Chronic lymphocytic leukemia of B-cell type not having achieved remission: Secondary | ICD-10-CM | POA: Diagnosis not present

## 2022-03-05 DIAGNOSIS — M8580 Other specified disorders of bone density and structure, unspecified site: Secondary | ICD-10-CM | POA: Diagnosis not present

## 2022-03-05 DIAGNOSIS — Z0000 Encounter for general adult medical examination without abnormal findings: Secondary | ICD-10-CM | POA: Diagnosis not present

## 2022-03-05 DIAGNOSIS — I482 Chronic atrial fibrillation, unspecified: Secondary | ICD-10-CM | POA: Diagnosis not present

## 2022-03-05 DIAGNOSIS — D6869 Other thrombophilia: Secondary | ICD-10-CM | POA: Diagnosis not present

## 2022-05-11 ENCOUNTER — Other Ambulatory Visit: Payer: Self-pay | Admitting: Cardiovascular Disease

## 2022-05-11 DIAGNOSIS — I4819 Other persistent atrial fibrillation: Secondary | ICD-10-CM

## 2022-05-11 NOTE — Telephone Encounter (Signed)
Prescription refill request for Eliquis received. Indication: a fib Last office visit: 01/02/22 Scr: 0.68 10/29/21 epic Age: 82 Weight: 97kg

## 2022-05-18 ENCOUNTER — Telehealth: Payer: Self-pay | Admitting: Cardiovascular Disease

## 2022-05-18 MED ORDER — METOPROLOL TARTRATE 100 MG PO TABS: ORAL_TABLET | ORAL | 3 refills | Status: DC

## 2022-05-18 NOTE — Telephone Encounter (Signed)
Pt c/o medication issue:  1. Name of Medication: metoprolol tartrate (LOPRESSOR) 100 MG tablet   2. How are you currently taking this medication (dosage and times per day)?   3. Are you having a reaction (difficulty breathing--STAT)?   4. What is your medication issue? Pt states [facility] needs a new prescription for the metoprolol.   She also states that she only has enough for today and tomorrow.   [id number] - [place], [place] - [address]

## 2022-05-18 NOTE — Telephone Encounter (Signed)
Called pt back to let her know that prescription has been sent in.

## 2022-06-30 ENCOUNTER — Telehealth: Payer: Self-pay | Admitting: Cardiovascular Disease

## 2022-06-30 MED ORDER — FUROSEMIDE 40 MG PO TABS: 40.0000 mg | ORAL_TABLET | Freq: Every day | ORAL | 5 refills | Status: AC

## 2022-06-30 MED ORDER — POTASSIUM CHLORIDE CRYS ER 20 MEQ PO TBCR: 20.0000 meq | EXTENDED_RELEASE_TABLET | ORAL | 5 refills | Status: AC

## 2022-06-30 NOTE — Telephone Encounter (Signed)
Pt c/o swelling: STAT is pt has developed SOB within 24 hours  How much weight have you gained and in what time span? Not sure but noticed she's not able to wear pants she was once able to wear.   If swelling, where is the swelling located? Left foot and left leg  Are you currently taking a fluid pill? No   Are you currently SOB? Occasionally yes  Do you have a log of your daily weights (if so, list)? No  Have you gained 3 pounds in a day or 5 pounds in a week? 5 lbs in a week   Have you traveled recently? No

## 2022-06-30 NOTE — Telephone Encounter (Signed)
Patient states she does not have a scale that works. She states she hasn't weighed "just the clothes don't fit like they used to".  She states seh usually weighs 196.  States her last physical was 70, (December was 215)but doesn't think she eats that much.  Patient states swelling to LLE for about a week and a half.  States last week it would resolve with elevation, but not this week.  Continues to elevate as much as possible.  Resting more than usual. Continues to watch sodium intake. Per patient non pitting. Patient states SOB with exertion only, none at rest. She does state she needed to sleep in recliner last night. She is currently not taking Lasix.  She took one then found it was expired as well as her potassium.  She is drinking and staying hydrated.  Advised to take Lasix and potassium today and tomorrow.  Advised to get a scale if possible today as we need to have a start weight and this will help determining the dosage of Lasix and how often it is needed. Also if the pharmacy has a scale, to weigh to give a baseline.  New order for Lasix  and potassium sent to pharmacy.  Patient will call in two days to update.

## 2022-08-11 DIAGNOSIS — H5213 Myopia, bilateral: Secondary | ICD-10-CM | POA: Diagnosis not present

## 2022-08-11 DIAGNOSIS — H40013 Open angle with borderline findings, low risk, bilateral: Secondary | ICD-10-CM | POA: Diagnosis not present

## 2022-09-24 ENCOUNTER — Other Ambulatory Visit: Payer: Self-pay

## 2022-09-24 ENCOUNTER — Emergency Department
Admission: EM | Admit: 2022-09-24 | Discharge: 2022-09-24 | Disposition: A | Discharge status: Home / Self Care | Payer: Medicare Other | Attending: Emergency Medicine | Admitting: Emergency Medicine

## 2022-09-24 ENCOUNTER — Encounter: Payer: Self-pay | Admitting: Emergency Medicine

## 2022-09-24 DIAGNOSIS — Z7901 Long term (current) use of anticoagulants: Secondary | ICD-10-CM | POA: Insufficient documentation

## 2022-09-24 DIAGNOSIS — N938 Other specified abnormal uterine and vaginal bleeding: Secondary | ICD-10-CM | POA: Insufficient documentation

## 2022-09-24 DIAGNOSIS — N939 Abnormal uterine and vaginal bleeding, unspecified: Secondary | ICD-10-CM | POA: Diagnosis not present

## 2022-09-24 LAB — URINALYSIS, ROUTINE W REFLEX MICROSCOPIC
Bacteria, UA: NONE SEEN
Bilirubin Urine: NEGATIVE
Glucose, UA: NEGATIVE mg/dL
Ketones, ur: NEGATIVE mg/dL
Nitrite: NEGATIVE
Protein, ur: NEGATIVE mg/dL
Specific Gravity, Urine: 1.016 (ref 1.005–1.030)
Squamous Epithelial / HPF: NONE SEEN /HPF (ref 0–5)
pH: 5 (ref 5.0–8.0)

## 2022-09-24 LAB — CBC
HCT: 43.9 % (ref 36.0–46.0)
Hemoglobin: 14.7 g/dL (ref 12.0–15.0)
MCH: 29.1 pg (ref 26.0–34.0)
MCHC: 33.5 g/dL (ref 30.0–36.0)
MCV: 86.8 fL (ref 80.0–100.0)
Platelets: 104 10*3/uL — ABNORMAL LOW (ref 150–400)
RBC: 5.06 MIL/uL (ref 3.87–5.11)
RDW: 14.3 % (ref 11.5–15.5)
WBC: 8.2 10*3/uL (ref 4.0–10.5)
nRBC: 0 % (ref 0.0–0.2)

## 2022-09-24 LAB — BASIC METABOLIC PANEL
Anion gap: 11 (ref 5–15)
BUN: 14 mg/dL (ref 8–23)
CO2: 25 mmol/L (ref 22–32)
Calcium: 9.2 mg/dL (ref 8.9–10.3)
Chloride: 105 mmol/L (ref 98–111)
Creatinine, Ser: 0.72 mg/dL (ref 0.44–1.00)
GFR, Estimated: >60 mL/min (ref >60)
Glucose, Bld: 116 mg/dL — ABNORMAL HIGH (ref 70–99)
Potassium: 4.2 mmol/L (ref 3.5–5.1)
Sodium: 141 mmol/L (ref 135–145)

## 2022-09-24 LAB — WET PREP, GENITAL
Clue Cells Wet Prep HPF POC: NONE SEEN
Sperm: NONE SEEN
Trich, Wet Prep: NONE SEEN
WBC, Wet Prep HPF POC: >=10 — AB (ref <10)
Yeast Wet Prep HPF POC: NONE SEEN

## 2022-09-24 NOTE — ED Provider Notes (Signed)
[facility] Provider Note    Event Date/Time   First MD Initiated Contact with Patient 09/24/22 2046     (approximate)   History   Chief Complaint Vaginal Bleeding   HPI  Tasha [name] is a 82 y.o. female with past medical history of CLL, atrial fibrillation on Eliquis, and hypogammaglobulinemia who presents to the ED complaining of vaginal bleeding.  Patient reports that she wears a pad regularly due to urinary incontinence, first noticed a small amount of blood around lunchtime when she went to change the pad.  She then noticed increasing amounts of blood on her pad throughout the day, denies any associated pain or discharge.  She has never had similar symptoms in the past, states she had a hysterectomy performed almost 40 years ago.     Physical Exam   Triage Vital Signs: ED Triage Vitals  Encounter Vitals Group     BP 09/24/22 1821 (!) 139/90     Systolic BP Percentile --      Diastolic BP Percentile --      Pulse Rate 09/24/22 1821 95     Resp 09/24/22 1821 18     Temp 09/24/22 1821 98.5 F (36.9 C)     Temp Source 09/24/22 1821 Oral     SpO2 09/24/22 1821 99 %     Weight 09/24/22 1822 199 lb (90.3 kg)     Height 09/24/22 1822 5\' 7"  (1.702 m)     Head Circumference --      Peak Flow --      Pain Score 09/24/22 1821 0     Pain Loc --      Pain Education --      Exclude from Growth Chart --     Most recent vital signs: Vitals:   09/24/22 1821 09/24/22 2042  BP: (!) 139/90 (!) 109/96  Pulse: 95 94  Resp: 18 19  Temp: 98.5 F (36.9 C)   SpO2: 99% 99%    Constitutional: Alert and oriented. Eyes: Conjunctivae are normal. Head: Atraumatic. Nose: No congestion/rhinnorhea. Mouth/Throat: Mucous membranes are moist.  Cardiovascular: Normal rate, regular rhythm. Grossly normal heart sounds.  2+ radial pulses bilaterally. Respiratory: Normal respiratory effort.  No retractions. Lungs CTAB. Gastrointestinal: Soft and nontender. No  distention. Genitourinary: Irritated appearing vaginal mucosa with no bleeding or discharge. Musculoskeletal: No lower extremity tenderness nor edema.  Neurologic:  Normal speech and language. No gross focal neurologic deficits are appreciated.    ED Results / Procedures / Treatments   Labs (all labs ordered are listed, but only abnormal results are displayed) Labs Reviewed  WET PREP, GENITAL - Abnormal; Notable for the following components:      Result Value   WBC, Wet Prep HPF POC >=10 (*)    All other components within normal limits  CBC - Abnormal; Notable for the following components:   Platelets 104 (*)    All other components within normal limits  BASIC METABOLIC PANEL - Abnormal; Notable for the following components:   Glucose, Bld 116 (*)    All other components within normal limits  URINALYSIS, ROUTINE W REFLEX MICROSCOPIC - Abnormal; Notable for the following components:   Color, Urine YELLOW (*)    APPearance CLEAR (*)    Hgb urine dipstick MODERATE (*)    Leukocytes,Ua TRACE (*)    All other components within normal limits    PROCEDURES:  Critical Care performed: No  Procedures   MEDICATIONS ORDERED IN ED: Medications -  No data to display   IMPRESSION / MDM / ASSESSMENT AND PLAN / ED COURSE  I reviewed the triage vital signs and the nursing notes.                              82 y.o. female with past medical history of CLL, atrial fibrillation on Eliquis, and hypogammaglobulinemia who presents to the ED complaining of vaginal bleeding gradually worsening over the course of the day today.  Patient's presentation is most consistent with acute presentation with potential threat to life or bodily function.  Differential diagnosis includes, but is not limited to, vaginitis, vaginosis, cystitis, malignancy.  Patient nontoxic-appearing and in no acute distress, vital signs are unremarkable.  Labs are reassuring with no significant anemia, leukocytosis,  lecture abnormality, or AKI.  Pelvic exam shows intact vaginal cuff with irritated appearing vaginal mucosa but no bleeding at this time.  Wet prep is unremarkable and patient appropriate for discharge home with outpatient OB/GYN follow-up.  She was counseled to return to the ED for new or worsening symptoms, patient agrees with plan.      FINAL CLINICAL IMPRESSION(S) / ED DIAGNOSES   Final diagnoses:  Vaginal bleeding     Rx / DC Orders   ED Discharge Orders     None        Note:  This document was prepared using Dragon voice recognition software and may include unintentional dictation errors.   Chesley Noon, MD 09/24/22 2234

## 2022-09-24 NOTE — ED Triage Notes (Signed)
Patient arrives ambulatory with rolling walker by POV c/o vaginal bleeding onset of this morning. States it is bright red blood. No blood clots. Takes eliquis. Hx of hysterectomy over 40 years ago.

## 2022-09-24 NOTE — ED Notes (Signed)
Scant amount of dried blood noted on Pts pad when placing on the toilet.

## 2022-09-25 DIAGNOSIS — N939 Abnormal uterine and vaginal bleeding, unspecified: Secondary | ICD-10-CM | POA: Diagnosis not present

## 2022-09-28 NOTE — Group Note (Deleted)

## 2022-10-28 ENCOUNTER — Other Ambulatory Visit: Payer: Self-pay

## 2022-10-28 DIAGNOSIS — C9110 Chronic lymphocytic leukemia of B-cell type not having achieved remission: Secondary | ICD-10-CM

## 2022-10-29 ENCOUNTER — Inpatient Hospital Stay: Payer: Medicare Other | Attending: Hematology and Oncology | Admitting: Hematology and Oncology

## 2022-10-29 ENCOUNTER — Inpatient Hospital Stay: Payer: Medicare Other

## 2022-10-29 ENCOUNTER — Encounter: Payer: Self-pay | Admitting: Oncology

## 2022-10-29 VITALS — BP 129/61 | HR 93 | Temp 97.9°F | Resp 16 | Wt 216.6 lb

## 2022-10-29 DIAGNOSIS — C9110 Chronic lymphocytic leukemia of B-cell type not having achieved remission: Secondary | ICD-10-CM | POA: Diagnosis not present

## 2022-10-29 DIAGNOSIS — Z809 Family history of malignant neoplasm, unspecified: Secondary | ICD-10-CM | POA: Diagnosis not present

## 2022-10-29 LAB — CMP (CANCER CENTER ONLY)
ALT: 11 U/L (ref 0–44)
AST: 12 U/L — ABNORMAL LOW (ref 15–41)
Albumin: 4.4 g/dL (ref 3.5–5.0)
Alkaline Phosphatase: 66 U/L (ref 38–126)
Anion gap: 6 (ref 5–15)
BUN: 17 mg/dL (ref 8–23)
CO2: 31 mmol/L (ref 22–32)
Calcium: 9.7 mg/dL (ref 8.9–10.3)
Chloride: 104 mmol/L (ref 98–111)
Creatinine: 0.88 mg/dL (ref 0.44–1.00)
GFR, Estimated: >60 mL/min (ref >60)
Glucose, Bld: 125 mg/dL — ABNORMAL HIGH (ref 70–99)
Potassium: 4.6 mmol/L (ref 3.5–5.1)
Sodium: 141 mmol/L (ref 135–145)
Total Bilirubin: 1.2 mg/dL (ref 0.3–1.2)
Total Protein: 6.3 g/dL — ABNORMAL LOW (ref 6.5–8.1)

## 2022-10-29 LAB — CBC WITH DIFFERENTIAL (CANCER CENTER ONLY)
Abs Immature Granulocytes: 0.01 10*3/uL (ref 0.00–0.07)
Basophils Absolute: 0 10*3/uL (ref 0.0–0.1)
Basophils Relative: 0 %
Eosinophils Absolute: 0.2 10*3/uL (ref 0.0–0.5)
Eosinophils Relative: 2 %
HCT: 45 % (ref 36.0–46.0)
Hemoglobin: 15.2 g/dL — ABNORMAL HIGH (ref 12.0–15.0)
Immature Granulocytes: 0 %
Lymphocytes Relative: 50 %
Lymphs Abs: 4.2 10*3/uL — ABNORMAL HIGH (ref 0.7–4.0)
MCH: 29.1 pg (ref 26.0–34.0)
MCHC: 33.8 g/dL (ref 30.0–36.0)
MCV: 86.2 fL (ref 80.0–100.0)
Monocytes Absolute: 0.7 10*3/uL (ref 0.1–1.0)
Monocytes Relative: 9 %
Neutro Abs: 3.2 10*3/uL (ref 1.7–7.7)
Neutrophils Relative %: 39 %
Platelet Count: 98 10*3/uL — ABNORMAL LOW (ref 150–400)
RBC: 5.22 MIL/uL — ABNORMAL HIGH (ref 3.87–5.11)
RDW: 14.1 % (ref 11.5–15.5)
WBC Count: 8.4 10*3/uL (ref 4.0–10.5)
nRBC: 0 % (ref 0.0–0.2)

## 2022-10-29 NOTE — Progress Notes (Signed)
[facility] Telephone:([phone]   [phone]  PROGRESS NOTE  Patient Care Team: Kirby Funk, MD (Inactive) as PCP - General (Internal Medicine) Thurmon Fair, MD as PCP - Cardiology (Cardiology) Croitoru, Rachelle Hora, MD as Consulting Physician (Cardiology) Magrinat, Valentino Hue, MD (Inactive) as Consulting Physician (Oncology) Elta Guadeloupe, MD as Referring Physician (Internal Medicine)  Hematological/Oncological History # CLL Rai Stage 0 10/10/2020: last visit with Dr. Darnelle Catalan 10/29/2021: establish care with Dr. Leonides Schanz   Interval History:  Tasha [name] 82 y.o. female with medical history significant for CLL Rai Stage 0 who presents for a follow up visit. The patient's last visit was on 10/29/2021. In the interim since the last visit she has had no major changes in her health.  On exam today Tasha [name] reports she is caring for her husband with dementia and a 97 year old boy.  She reports it does take a lot of energy but she is doing her best.  She reports that she does feel like she acuminate some fluid in her legs bilaterally and does have a recent mosquito bite on her left leg.  She reports she does have some occasional coughing.  She reports that her energy is "none".  She notes that she does have a daughter that helps her out and gets her groceries.  She reports that she does get tired and fatigued when trying to walk to the kitchen and back.  She reports she is not eating particular well as she is not hungry.  Her appetite is poor.  She denies any bumps or lumps concerning for lymphadenopathy.  She has not been having any trouble with bleeding, bruising, or dark stools.  She reports that she is also not having any runny nose, sore throat, or cough though she does have seasonal allergies.  She denies any fevers, chills, sweats, nausea, vomiting or diarrhea. A full 10 point ROS.   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   IFG (impaired fasting glucose)    e   Personal history of other diseases of circulatory system    Tick bite    Unspecified hereditary and idiopathic peripheral neuropathy    Vitamin B12 deficiency    Vitamin D deficiency     SURGICAL HISTORY: Past Surgical History:  Procedure Laterality Date   ABDOMINAL HYSTERECTOMY  1984   fibroids   BREAST SURGERY  1960s   benign breast tumors   CARDIOVERSION N/A 05/15/2020   Procedure: CARDIOVERSION;  Surgeon: Thurmon Fair, MD;  Location: [facility];  Service: Cardiovascular;  Laterality: N/A;   CARDIOVERSION N/A 07/02/2020   Procedure: CARDIOVERSION;  Surgeon: Vesta Mixer, MD;  Location: [facility];  Service: Cardiovascular;  Laterality: N/A;   CATARACT EXTRACTION W/ INTRAOCULAR LENS  IMPLANT, BILATERAL     LAPAROSCOPIC BILATERAL SALPINGO OOPHERECTOMY  2003   ovarian cyst    SOCIAL HISTORY: Social History   Socioeconomic History   Marital status: Married    Spouse name: Colen   Number of children: 3   Years of education: college   Highest education level: Not on file  Occupational History    Comment: Retired  Tobacco  Use   Smoking status: Never   Smokeless tobacco: Never  Substance and Sexual Activity   Alcohol use: No    Alcohol/week: 0.0 standard drinks of alcohol   Drug use: No   Sexual activity: Not on file  Other Topics Concern   Not on file  Social History Narrative   Patient is married Systems developer). Patient is retired . Patient has college education.    Right handed.   Caffeine- not every day . Soda or tea when she is out for dinner.   Social Determinants of Health   Financial Resource Strain: Not on file  Food Insecurity:  Not on file  Transportation Needs: Not on file  Physical Activity: Not on file  Stress: Not on file  Social Connections: Not on file  Intimate Partner Violence: Not on file    FAMILY HISTORY: Family History  Problem Relation Age of Onset   Hypertension Mother    Coronary artery disease Father        died at 90   Hypertension Father    Diabetes Father    Coronary artery disease Brother        died at 1   Diabetes Brother    Hypertension Brother    Cancer Daughter     ALLERGIES:  is allergic to clindamycin, celebrex [celecoxib], statins, tape, tricor [fenofibrate], amoxicillin, clarithromycin, latex, and methimazole.  MEDICATIONS:  Current Outpatient Medications  Medication Sig Dispense Refill   acetaminophen (TYLENOL) 500 MG tablet Take 500-1,000 mg by mouth 2 (two) times daily as needed (pain.). (Patient not taking: Reported on 01/02/2022)     cyanocobalamin 1000 MCG tablet Take 1,000 mcg by mouth daily.     ELIQUIS 5 MG TABS tablet TAKE 1 TABLET BY MOUTH TWICE DAILY 180 tablet 1   furosemide (LASIX) 40 MG tablet Take 1 tablet (40 mg total) by mouth daily. Do not take if your weight is less than 195 lbs 30 tablet 5   metoprolol tartrate (LOPRESSOR) 100 MG tablet TAKE 1 TABLET(100 MG) BY MOUTH TWICE DAILY 180 tablet 3   Polyethyl Glycol-Propyl Glycol (SYSTANE) 0.4-0.3 % SOLN Place 1-2 drops into both eyes in the morning and at bedtime.     potassium chloride SA (KLOR-CON M20) 20 MEQ tablet Take 1 tablet (20 mEq total) by mouth See admin instructions. Do not take if your weight is less than 195 lbs 30 tablet 5   VITAMIN D, CHOLECALCIFEROL, PO Take 1,000 mcg by mouth daily.     No current facility-administered medications for this visit.    REVIEW OF SYSTEMS:   Constitutional: ( - ) fevers, ( - )  chills , ( - ) night sweats Eyes: ( - ) blurriness of vision, ( - ) double vision, ( - ) watery eyes Ears, nose, mouth, throat, and face: ( - ) mucositis, ( - ) sore  throat Respiratory: ( - ) cough, ( - ) dyspnea, ( - ) wheezes Cardiovascular: ( - ) palpitation, ( - ) chest discomfort, ( - ) lower extremity swelling Gastrointestinal:  ( - ) nausea, ( - ) heartburn, ( - ) change in bowel habits Skin: ( - ) abnormal skin rashes Lymphatics: ( - ) new lymphadenopathy, ( - ) easy bruising Neurological: ( - ) numbness, ( - ) tingling, ( - ) new weaknesses Behavioral/Psych: ( - ) mood change, ( - ) new changes  All other systems were reviewed with the patient and are negative.  PHYSICAL EXAMINATION: ECOG PERFORMANCE STATUS: 0 -  Asymptomatic  Vitals:   10/29/22 1110  BP: 129/61  Pulse: 93  Resp: 16  Temp: 97.9 F (36.6 C)  SpO2: 99%    Filed Weights   10/29/22 1110  Weight: 216 lb 9.6 oz (98.2 kg)     GENERAL: Well-appearing elderly Caucasian female, alert, no distress and comfortable SKIN: skin color, texture, turgor are normal, no rashes or significant lesions EYES: conjunctiva are pink and non-injected, sclera clear NECK: supple, non-tender LYMPH:  no palpable lymphadenopathy in the cervical, axillary or inguinal LUNGS: clear to auscultation and percussion with normal breathing effort HEART: regular rate & rhythm and no murmurs and no lower extremity edema Musculoskeletal: no cyanosis of digits and no clubbing  PSYCH: alert & oriented x 3, fluent speech NEURO: no focal motor/sensory deficits  LABORATORY DATA:  I have reviewed the data as listed    Latest Ref Rng & Units 10/29/2022    9:32 AM 09/24/2022    6:24 PM 10/29/2021    9:58 AM  CBC  WBC 4.0 - 10.5 K/uL 8.4  8.2  7.3   Hemoglobin 12.0 - 15.0 g/dL 32.9  51.8  84.1   Hematocrit 36.0 - 46.0 % 45.0  43.9  41.3   Platelets 150 - 400 K/uL 98  104  181        Latest Ref Rng & Units 10/29/2022    9:32 AM 09/24/2022    6:24 PM 10/29/2021    9:58 AM  CMP  Glucose 70 - 99 mg/dL 660  630  160   BUN 8 - 23 mg/dL 17  14  14    Creatinine 0.44 - 1.00 mg/dL 1.09  3.23  5.57   Sodium  135 - 145 mmol/L 141  141  140   Potassium 3.5 - 5.1 mmol/L 4.6  4.2  4.4   Chloride 98 - 111 mmol/L 104  105  106   CO2 22 - 32 mmol/L 31  25  28    Calcium 8.9 - 10.3 mg/dL 9.7  9.2  9.2   Total Protein 6.5 - 8.1 g/dL 6.3   6.3   Total Bilirubin 0.3 - 1.2 mg/dL 1.2   0.8   Alkaline Phos 38 - 126 U/L 66   68   AST 15 - 41 U/L 12   11   ALT 0 - 44 U/L 11   7     RADIOGRAPHIC STUDIES: No results found.  ASSESSMENT & PLAN Tasha [name] 82 y.o. female with medical history significant for CLL Rai Stage 0 who presents for a follow up visit.   # CLL Rai Stage 0 --labs today show WBC 8.4, Hgb 15.2, MCV 86.2, Plt 98 --no evidence of lymphocytosis, disease has been stable x 22 years.  Patient has never required treatment. --patient is currently asymptomatic --RTC in 1 years time.    No orders of the defined types were placed in this encounter.   All questions were answered. The patient knows to call the clinic with any problems, questions or concerns.  A total of more than 25 minutes were spent on this encounter with face-to-face time and non-face-to-face time, including preparing to see the patient, ordering tests and/or medications, counseling the patient and coordination of care as outlined above.   Ulysees Barns, MD Department of Hematology/Oncology [facility] Phone: [phone] Pager: [phone] Email: [email or web address]  10/29/2022 5:38 PM

## 2022-11-09 DIAGNOSIS — H401132 Primary open-angle glaucoma, bilateral, moderate stage: Secondary | ICD-10-CM | POA: Diagnosis not present

## 2022-11-17 ENCOUNTER — Other Ambulatory Visit: Payer: Self-pay | Admitting: Cardiovascular Disease

## 2022-11-17 DIAGNOSIS — I4819 Other persistent atrial fibrillation: Secondary | ICD-10-CM

## 2022-11-18 NOTE — Telephone Encounter (Signed)
Prescription refill request for Eliquis received. Indication: AF Last office visit: 01/02/22  Judie Petit Croitoru MD Scr: 0.88 on 10/29/22  Epic Age: 82 Weight: 97.8kg  Based on above findings Eliquis 5mg  twice daily is the appropriate dose.  Refill approved.

## 2023-01-12 ENCOUNTER — Ambulatory Visit: Payer: Medicare Other | Admitting: Cardiovascular Disease

## 2023-02-21 NOTE — Progress Notes (Deleted)
 Cardiology Office Note:    Date:  02/21/2023   ID:  Tasha [name], DOB 1940/11/18, MRN [id number]  PCP:  Tasha Funk, MD (Inactive)  Cardiologist:  Tasha Fair, MD    Referring MD: No ref. provider found   Chief complaint: atrial fibrillation   History of Present Illness:    Tasha [name] is a 83 y.o. female with a hx of persistent atrial fibrillation.  Additional medical problems include CLL with mild thrombocytopenia, multiple sclerosis with very infrequent problems, ocular migraines and idiopathic peripheral neuropathy.   She has not had any further syncope or falls since her last appointment.  She remains under a lot of emotional stress due to her husband's progressive dementia.  He has good days and bad days.  Sometimes he is verbally abusive, but he has never been physically violent.  Their 82 year old grandson also lives with them.  Unfortunately, his parents have their own psychosocial issues so Tasha [name] is taking care of him.  He is a good Consulting civil engineer at [employer].  She had a syncopal event in July 2022, complicated by right foot fracture.  Also has multiple sclerosis and peripheral neuropathy.  She has mild exertional dyspnea, but never "heart race".  The patient specifically denies any chest pain at rest or with exertion, dyspnea at rest , orthopnea, paroxysmal nocturnal dyspnea, syncope, palpitations, focal neurological deficits, intermittent claudication, unexplained weight gain, cough, hemoptysis or wheezing.  She is worried about purplish discoloration of both feet.  Sometimes able blue and mottled.  They get better if she walks.  She does not have pain but sometimes has some burning neuropathic symptoms.    She does not have a lot of edema.  She has not taken any diuretics in over a year.  We had previously established her "dry weight" at 195 pounds, but she's probably gained weight since then.  She has not had any further falls and does not have any bleeding problems.  She is  compliant with Eliquis twice daily.  She has not experienced syncopal events before or since.  She struggled with paroxysmal atrial fibrillation for about 13 years, until she developed persistent atrial fibrillation after COVID-19 infection with pneumonia and transient hyperthyroidism in March 2022.  Initial cardioversion failed and she was started on flecainide.  The flecainide did cause some hallucination like side effects.  She underwent another cardioversion but had early recurrence of atrial fibrillation and subsequently decided to manage this with rate control (she was afraid of the interaction between amiodarone and her thyroid condition; did not want to do hospitalization for dofetilide).    He was hospitalized 03 17-03 25 2022with Covid pneumonia and hypoxemia, despite the fact that she had received 2 vaccine doses and a booster.  She was also hyponatremic during that admission and was found to be in persistent atrial fibrillation.  Rate control was challenging and digoxin was added to metoprolol.  TSH was suppressed and free T4 was mildly elevated so she was started on methimazole and is referred to see an endocrinologist (this will not happen until June 13).  An echocardiogram during that hospitalization showed that LVEF remains normal at 65 to 70%.  The left atrium was described as mildly dilated.  There was no significant valvular abnormality detected.  She underwent cardioversion on May 15, 2020.  Multiple shocks were administered, but each time she reverted to atrial fibrillation after just a few seconds in sinus rhythm.  She has developed edema of the lower extremities reaching up  to the knees bilaterally and has shortness of breath with usual activity (NYHA functional class II).  She finds that she coughs and feels a little short of breath if she lies on her left side and feels better when she turns over onto the right.  She has not had frank orthopnea or PND.  She is compliant with  anticoagulation and has not had any falls or serious bleeding problems.  She denies angina or syncope.  She is unaware of the arrhythmia.  She has noticed that her heart rate is slower now (digoxin and flecainide were both added recently).  She does not have known structural heart disease. She had a normal echo in 2013 at [facility] (normal LV systolic function, mild LVH with septal wall thickness 1.2 cm, borderline left atrial dilation at 3.9 cm, 25 cm). She does not have known coronary artery problems.  Follow-up echo March 2022 showed EF 65 to 70%, mildly dilated left atrium.  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   IFG (impaired fasting glucose)    e   Personal history of other diseases of circulatory system    Tick bite    Unspecified hereditary and idiopathic peripheral neuropathy    Vitamin B12 deficiency    Vitamin D deficiency     Past Surgical History:  Procedure Laterality Date   ABDOMINAL HYSTERECTOMY  1984   fibroids   BREAST SURGERY  1960s   benign breast tumors   CARDIOVERSION N/A 05/15/2020   Procedure: CARDIOVERSION;  Surgeon: Tasha Fair, MD;  Location: [facility];  Service: Cardiovascular;  Laterality: N/A;   CARDIOVERSION N/A 07/02/2020   Procedure: CARDIOVERSION;  Surgeon: Vesta Mixer, MD;  Location: [facility];  Service: Cardiovascular;  Laterality: N/A;   CATARACT EXTRACTION W/ INTRAOCULAR LENS  IMPLANT, BILATERAL     LAPAROSCOPIC BILATERAL SALPINGO OOPHERECTOMY  2003   ovarian cyst    Current Medications: No outpatient medications have been marked as taking for the 02/22/23 encounter (Appointment) with Tasha Fair, MD.     Allergies:    Clindamycin, Celebrex [celecoxib], Statins, Tape, Tricor [fenofibrate], Amoxicillin, Clarithromycin, Latex, and Methimazole   Social History   Socioeconomic History   Marital status: Married    Spouse name: Cabin crew   Number of children: 3   Years of education: college   Highest education level: Not on file  Occupational History    Comment: Retired  Tobacco Use   Smoking status: Never   Smokeless tobacco: Never  Substance and Sexual Activity   Alcohol use: No    Alcohol/week: 0.0 standard drinks of alcohol   Drug use: No   Sexual activity: Not on file  Other Topics Concern   Not on file  Social History Narrative   Patient is married Systems developer). Patient is retired . Patient has college education.    Right handed.   Caffeine- not every day . Soda or tea when she is out for dinner.   Social Drivers of Corporate investment banker Strain: Not on file  Food Insecurity: Not on file  Transportation Needs: Not on file  Physical Activity: Not on file  Stress: Not on file  Social  Connections: Not on file     Family History: The patient's family history includes Cancer in her daughter; Coronary artery disease in her brother and father; Diabetes in her brother and father; Hypertension in her brother, father, and mother. ROS:   Please see the history of present illness.    All other systems are reviewed and are negative EKGs/Labs/Other Studies Reviewed:   11/01/2017 echo - Left ventricle: The cavity size was normal. There was severe   concentric hypertrophy. Systolic function was vigorous. The   estimated ejection fraction was in the range of 65% to 70%. Wall   motion was normal; there were no regional wall motion   abnormalities. Doppler parameters are consistent with abnormal   left ventricular relaxation (grade 1 diastolic dysfunction). - Aortic valve: Trileaflet; mildly thickened leaflets. - Mitral valve: Moderately thickened, mildly calcified leaflets . - Right ventricle:  Systolic function was normal. - Right atrium: The atrium was normal in size. - Tricuspid valve: There was mild regurgitation. - Pulmonary arteries: Systolic pressure was within the normal   range. - Pericardium, extracardiac: There was no pericardial effusion.  Echo 04/07/2018  1. Left ventricular ejection fraction, by estimation, is 65 to 70%. The  left ventricle has normal function. The left ventricle has no regional  wall motion abnormalities. Left ventricular diastolic function could not  be evaluated.   2. Right ventricular systolic function is mildly reduced. The right  ventricular size is mildly enlarged. There is mildly elevated pulmonary  artery systolic pressure. The estimated right ventricular systolic  pressure is 33.7 mmHg.   3. Left atrial size was mildly dilated.   4. Right atrial size was mildly dilated.   5. The mitral valve was not assessed. No evidence of mitral valve  regurgitation. No evidence of mitral stenosis.   6. The aortic valve was not assessed. Aortic valve regurgitation is not  visualized. No aortic stenosis is present.   7. The inferior vena cava is normal in size with greater than 50%  respiratory variability, suggesting right atrial pressure of 3 mmHg.    Normal ABI January 2024  EKG:  EKG 01/02/2022 shows  Atrial fibrillation with controlled ventricular spots, QS pattern V1-V2, otherwise normal, QTc 408 ms   EKG Interpretation Date/Time:    Ventricular Rate:    PR Interval:    QRS Duration:    QT Interval:    QTC Calculation:   R Axis:      Text Interpretation:          Recent Labs: 10/29/2022: ALT 11; BUN 17; Creatinine 0.88; Hemoglobin 15.2; Platelet Count 98; Potassium 4.6; Sodium 141  Recent Lipid Panel No results found for: "CHOL", "TRIG", "HDL", "CHOLHDL", "VLDL", "LDLCALC", "LDLDIRECT"  06/25/2020 Cholesterol 163, HDL 53, LDL 75, triglycerides 79  Physical Exam:    VS:  There were no vitals taken for this visit.    Wt  Readings from Last 3 Encounters:  10/29/22 216 lb 9.6 oz (98.2 kg)  09/24/22 199 lb (90.3 kg)  01/02/22 215 lb 9.6 oz (97.8 kg)    General: Alert, oriented x3, no distress,  mildly obese Head: no evidence of trauma, PERRL, EOMI, no exophtalmos or lid lag, no myxedema, no xanthelasma; normal ears, nose and oropharynx Neck: normal jugular venous pulsations and no hepatojugular reflux; brisk carotid pulses without delay and no carotid bruits Chest: clear to auscultation, no signs of consolidation by percussion or palpation, normal fremitus, symmetrical and full respiratory excursions Cardiovascular: normal position and quality of the  apical impulse, irregular rhythm, normal first and second heart sounds, no murmurs, rubs or gallops Abdomen: no tenderness or distention, no masses by palpation, no abnormal pulsatility or arterial bruits, normal bowel sounds, no hepatosplenomegaly Extremities: no clubbing, cyanosis or edema; 2+ radial, ulnar and brachial pulses bilaterally; 2+ right femoral, posterior tibial and dorsalis pedis pulses; 2+ left femoral, posterior tibial and dorsalis pedis pulses; no subclavian or femoral bruits Neurological: grossly nonfocal Psych: Normal mood and affect     ASSESSMENT:    1. Permanent atrial fibrillation (HCC)   2. Chronic diastolic heart failure (HCC)   3. History of syncope   4. Prediabetes   5. Mild obesity   6. CLL (chronic lymphocytic leukemia) (HCC)       PLAN:    In order of problems listed above:  AFib: Persistent now for over a year.  Failed multiple attempts at cardioversion, even when on antiarrhythmic medications.  Metoprolol alone has proven to be sufficient for rate control.  Digoxin has been stopped.   CHADSVasc 3 (age 33, gender).  On Eliquis.  Chr diast HF: Once ventricular rate control was satisfactory, she no longer requires diuretics. Syncope: I was worried about arrhythmic syncope and recommended implantable loop recorder.  She has  not had one implanted.  She has not had syncope again since. 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 Prediabetes: Hemoglobin A1c has not been rechecked since March 2022. Mild obesity: Weight loss would help with correction of metabolic abnormalities. CLL: She was transiently thrombocytopenic, but most recent labs show a platelet count of 181,000.  Not on treatment for CLL. Cyanosis of the lower extremities: I think she has pretty good pulses.  Suspect cyanosis is due to venous stasis, but will check ABI.    There are no Patient Instructions on file for this visit.  Signed, Tasha Fair, MD  02/21/2023 5:17 PM    [facility]

## 2023-02-22 ENCOUNTER — Ambulatory Visit: Payer: Medicare Other | Admitting: Cardiovascular Disease

## 2023-02-22 DIAGNOSIS — I5032 Chronic diastolic (congestive) heart failure: Secondary | ICD-10-CM

## 2023-02-22 DIAGNOSIS — Z87898 Personal history of other specified conditions: Secondary | ICD-10-CM

## 2023-02-22 DIAGNOSIS — I4821 Permanent atrial fibrillation: Secondary | ICD-10-CM

## 2023-02-22 DIAGNOSIS — C9110 Chronic lymphocytic leukemia of B-cell type not having achieved remission: Secondary | ICD-10-CM

## 2023-02-22 DIAGNOSIS — E669 Obesity, unspecified: Secondary | ICD-10-CM

## 2023-02-22 DIAGNOSIS — R7303 Prediabetes: Secondary | ICD-10-CM

## 2023-04-13 NOTE — Progress Notes (Signed)
 Cardiology Office Note:    Date:  04/21/2023   ID:  Tasha [name], DOB [birth year], MRN [id number]  PCP:  Kirby Funk, MD (Inactive)  Cardiologist:  Thurmon Fair, MD    Referring MD: No ref. provider found   Chief complaint: atrial fibrillation   History of Present Illness:    Tasha [name] is a 83 y.o. female with a hx of permanent atrial fibrillation.  Additional medical problems include CLL with mild thrombocytopenia, multiple sclerosis with very infrequent problems, ocular migraines and idiopathic peripheral neuropathy.   She has no specific cardiovascular complaints and she has not experienced any syncope or falls, which had been a troublesome issue in years past (She had a syncopal event in July 2022, complicated by right foot fracture.  Also has multiple sclerosis and peripheral neuropathy).  She is having trouble coping with her husband's worsening dementia.  He is often verbally abusive although he has not been physically violent.  The patient specifically denies any chest pain at rest or with exertion, dyspnea at rest or with exertion, orthopnea, paroxysmal nocturnal dyspnea, syncope, palpitations, focal neurological deficits, intermittent claudication, lower extremity edema, unexplained weight gain, cough, hemoptysis or wheezing.  She notes that her legs are purpleish red from the knee down, especially on the left side, especially if she leaves them hanging while she is sitting in a chair.  She has not had any severe edema.  On her home scale she weighs 208 pounds, 5 pounds less than our office and roughly 10 pounds more than what we had previously estimated to be her "dry weight".  Unclear if she is gain real weight.  She sees Dr. Leonides Schanz for CLL, Rai stage 0.  This diagnosis is more than 83 years old and she has not yet required any treatment for it.  She has not experienced syncopal events before or since.  She struggled with paroxysmal atrial fibrillation for about 13 years,  until she developed persistent atrial fibrillation after COVID-19 infection with pneumonia and transient hyperthyroidism in March 2022.  Initial cardioversion failed and she was started on flecainide.  The flecainide did cause some hallucination like side effects.  She underwent another cardioversion but had early recurrence of atrial fibrillation and subsequently decided to manage this with rate control (she was afraid of the interaction between amiodarone and her thyroid condition; did not want to do hospitalization for dofetilide).    She was hospitalized 03 17-03 25 2022with Covid pneumonia and hypoxemia, despite the fact that she had received 2 vaccine doses and a booster.  She was also hyponatremic during that admission and was found to be in persistent atrial fibrillation.  Rate control was challenging and digoxin was added to metoprolol.  TSH was suppressed and free T4 was mildly elevated so she was started on methimazole and is referred to see an endocrinologist (this will not happen until June 13).  An echocardiogram during that hospitalization showed that LVEF remains normal at 65 to 70%.  The left atrium was described as mildly dilated.  There was no significant valvular abnormality detected.  She underwent cardioversion on May 15, 2020.  Multiple shocks were administered, but each time she reverted to atrial fibrillation after just a few seconds in sinus rhythm.  She has developed edema of the lower extremities reaching up to the knees bilaterally and has shortness of breath with usual activity (NYHA functional class II).  She finds that she coughs and feels a little short of breath if she lies  on her left side and feels better when she turns over onto the right.  She has not had frank orthopnea or PND.  She is compliant with anticoagulation and has not had any falls or serious bleeding problems.  She denies angina or syncope.  She is unaware of the arrhythmia.  She has noticed that her heart rate  is slower now (digoxin and flecainide were both added recently).  She does not have known structural heart disease. She had a normal echo in 2013 at [facility] (normal LV systolic function, mild LVH with septal wall thickness 1.2 cm, borderline left atrial dilation at 3.9 cm, 25 cm). She does not have known coronary artery problems.  Follow-up echo March 2022 showed EF 65 to 70%, mildly dilated left atrium.  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   IFG (impaired fasting glucose)    e   Personal history of other diseases of circulatory system    Tick bite    Unspecified hereditary and idiopathic peripheral neuropathy    Vitamin B12 deficiency    Vitamin D deficiency     Past Surgical History:  Procedure Laterality Date   ABDOMINAL HYSTERECTOMY  1984   fibroids   BREAST SURGERY  1960s   benign breast tumors   CARDIOVERSION N/A 05/15/2020   Procedure: CARDIOVERSION;  Surgeon: Thurmon Fair, MD;  Location: [facility];  Service: Cardiovascular;  Laterality: N/A;   CARDIOVERSION N/A 07/02/2020   Procedure: CARDIOVERSION;  Surgeon: Vesta Mixer, MD;  Location: [facility];  Service: Cardiovascular;  Laterality: N/A;   CATARACT EXTRACTION W/ INTRAOCULAR LENS  IMPLANT, BILATERAL     LAPAROSCOPIC BILATERAL SALPINGO OOPHERECTOMY  2003   ovarian cyst    Current Medications: Current Meds  Medication Sig   cyanocobalamin 1000 MCG tablet Take 1,000 mcg by mouth daily.   ELIQUIS 5 MG TABS tablet TAKE 1 TABLET BY MOUTH TWICE DAILY   furosemide (LASIX) 40 MG tablet Take 1 tablet (40 mg total) by mouth daily. Do not take if your weight is less than 195 lbs   metoprolol tartrate (LOPRESSOR)  100 MG tablet TAKE 1 TABLET(100 MG) BY MOUTH TWICE DAILY   Polyethyl Glycol-Propyl Glycol (SYSTANE) 0.4-0.3 % SOLN Place 1-2 drops into both eyes in the morning and at bedtime.   VITAMIN D, CHOLECALCIFEROL, PO Take 1,000 mcg by mouth daily.     Allergies:   Clindamycin, Celebrex [celecoxib], Statins, Tape, Tricor [fenofibrate], Amoxicillin, Clarithromycin, Latex, and Methimazole   Social History   Socioeconomic History   Marital status: Married    Spouse name: Cabin crew   Number of children: 3   Years of education: college   Highest education level: Not on file  Occupational History    Comment: Retired  Tobacco Use   Smoking status: Never   Smokeless tobacco: Never  Substance and Sexual Activity   Alcohol use: No    Alcohol/week: 0.0 standard drinks of alcohol   Drug use: No   Sexual activity: Not on file  Other Topics Concern   Not on file  Social History Narrative   Patient is married Systems developer). Patient is retired . Patient has college education.  Right handed.   Caffeine- not every day . Soda or tea when she is out for dinner.   Social Drivers of Corporate investment banker Strain: Not on file  Food Insecurity: Not on file  Transportation Needs: Not on file  Physical Activity: Not on file  Stress: Not on file  Social Connections: Not on file     Family History: The patient's family history includes Cancer in her daughter; Coronary artery disease in her brother and father; Diabetes in her brother and father; Hypertension in her brother, father, and mother. ROS:   Please see the history of present illness.    All other systems are reviewed and are negative EKGs/Labs/Other Studies Reviewed:   11/01/2017 echo - Left ventricle: The cavity size was normal. There was severe   concentric hypertrophy. Systolic function was vigorous. The   estimated ejection fraction was in the range of 65% to 70%. Wall   motion was normal; there were no regional wall motion   abnormalities.  Doppler parameters are consistent with abnormal   left ventricular relaxation (grade 1 diastolic dysfunction). - Aortic valve: Trileaflet; mildly thickened leaflets. - Mitral valve: Moderately thickened, mildly calcified leaflets . - Right ventricle: Systolic function was normal. - Right atrium: The atrium was normal in size. - Tricuspid valve: There was mild regurgitation. - Pulmonary arteries: Systolic pressure was within the normal   range. - Pericardium, extracardiac: There was no pericardial effusion.  Echo 04/07/2018  1. Left ventricular ejection fraction, by estimation, is 65 to 70%. The  left ventricle has normal function. The left ventricle has no regional  wall motion abnormalities. Left ventricular diastolic function could not  be evaluated.   2. Right ventricular systolic function is mildly reduced. The right  ventricular size is mildly enlarged. There is mildly elevated pulmonary  artery systolic pressure. The estimated right ventricular systolic  pressure is 33.7 mmHg.   3. Left atrial size was mildly dilated.   4. Right atrial size was mildly dilated.   5. The mitral valve was not assessed. No evidence of mitral valve  regurgitation. No evidence of mitral stenosis.   6. The aortic valve was not assessed. Aortic valve regurgitation is not  visualized. No aortic stenosis is present.   7. The inferior vena cava is normal in size with greater than 50%  respiratory variability, suggesting right atrial pressure of 3 mmHg.   EKG:   EKG Interpretation Date/Time:  Wednesday April 14 2023 11:15:42 EDT Ventricular Rate:  94 PR Interval:    QRS Duration:  72 QT Interval:  326 QTC Calculation: 407 R Axis:   -1  Text Interpretation: Atrial fibrillation Low voltage QRS Septal infarct , age undetermined When compared with ECG of 14-Aug-2021 17:16, No significant change since last tracing Confirmed by Alexandre Lightsey [phone]) on 04/20/2023 8:19:01 AM        Recent  Labs: 10/29/2022: ALT 11; BUN 17; Creatinine 0.88; Hemoglobin 15.2; Platelet Count 98; Potassium 4.6; Sodium 141  Recent Lipid Panel No results found for: "CHOL", "TRIG", "HDL", "CHOLHDL", "VLDL", "LDLCALC", "LDLDIRECT"  06/25/2020 Cholesterol 163, HDL 53, LDL 75, triglycerides 79  32/44/0102 Cholesterol 2025, HDL 37, LDL 132, triglycerides 312 Hemoglobin A1c 5.4%, TSH 1.130  10/29/2022 hemoglobin 15.2, creatinine 0.88, potassium 4.6, ALT 11  Physical Exam:    VS:  BP (!) 100/58 (BP Location: Left Arm, Patient Position: Sitting, Cuff Size: Large)   Pulse 94   Ht 5\' 6"  (1.676 m)   Wt 213 lb (96.6  kg)   SpO2 97%   BMI 34.38 kg/m     Wt Readings from Last 3 Encounters:  04/14/23 213 lb (96.6 kg)  10/29/22 216 lb 9.6 oz (98.2 kg)  09/24/22 199 lb (90.3 kg)     General: Alert, oriented x3, no distress, mildly obese Head: no evidence of trauma, PERRL, EOMI, no exophtalmos or lid lag, no myxedema, no xanthelasma; normal ears, nose and oropharynx Neck: normal jugular venous pulsations and no hepatojugular reflux; brisk carotid pulses without delay and no carotid bruits Chest: clear to auscultation, no signs of consolidation by percussion or palpation, normal fremitus, symmetrical and full respiratory excursions Cardiovascular: normal position and quality of the apical impulse, irregular rhythm, normal first and second heart sounds, no murmurs, rubs or gallops Abdomen: no tenderness or distention, no masses by palpation, no abnormal pulsatility or arterial bruits, normal bowel sounds, no hepatosplenomegaly Extremities: no clubbing, cyanosis or edema; normal distal pulses Neurological: grossly nonfocal Psych: Normal mood and affect   ASSESSMENT:    1. Longstanding persistent atrial fibrillation (HCC)   2. Acquired thrombophilia (HCC)   3. Chronic diastolic heart failure (HCC)   4. Syncope and collapse   5. Mild obesity   6. CLL (chronic lymphocytic leukemia) (HCC)        PLAN:    In order of problems listed above:  AFib: It has been more than 2 years since we have documented normal rhythm.  Failed multiple attempts at cardioversion, even when on antiarrhythmic medications.  Well rate controlled on metoprolol.   CHADSVasc 3 (age 59, gender).  On Eliquis.  Has not had embolic events Anticoagulation: Has not had any recent falls and denies other bleeding problems. Chr diast HF: This was really only an issue while she had rapid ventricular rates.  She is currently only taking diuretics occasionally and intermittently. Syncope: I was worried about arrhythmic syncope and recommended implantable loop recorder.  She has not had one implanted.  She has not had syncope again since. Mild obesity: Weight loss would help with correction of metabolic abnormalities. CLL: She has had mild thrombocytopenia around 100,000 going back at least to 2011.  She is not anemic.  Followed by Dr. Leonides Schanz and not on active treatment for CLL. Cyanosis of the lower extremities: normal ABI in 2024.More likely due to venous insufficiency    Patient Instructions  Medication Instructions:  No changes *If you need a refill on your cardiac medications before your next appointment, please call your pharmacy*  Follow-Up: At [facility], you and your health needs are our priority.  As part of our continuing mission to provide you with exceptional heart care, we have created designated Provider Care Teams.  These Care Teams include your primary Cardiologist (physician) and Advanced Practice Providers (APPs -  Physician Assistants and Nurse Practitioners) who all work together to provide you with the care you need, when you need it.  We recommend signing up for the patient portal called "MyChart".  Sign up information is provided on this After Visit Summary.  MyChart is used to connect with patients for Virtual Visits (Telemedicine).  Patients are able to view lab/test results,  encounter notes, upcoming appointments, etc.  Non-urgent messages can be sent to your provider as well.   To learn more about what you can do with MyChart, go to [email or web address].    Your next appointment:   1 year(s)  Provider:   Thurmon Fair, MD  Signed, Thurmon Fair, MD  04/21/2023 4:22 PM    [facility]

## 2023-04-14 ENCOUNTER — Ambulatory Visit: Payer: Medicare Other | Admitting: Cardiovascular Disease

## 2023-04-14 ENCOUNTER — Encounter: Payer: Self-pay | Admitting: Cardiovascular Disease

## 2023-04-14 VITALS — BP 100/58 | HR 94 | Ht 66.0 in | Wt 213.0 lb

## 2023-04-14 DIAGNOSIS — R7303 Prediabetes: Secondary | ICD-10-CM | POA: Diagnosis present

## 2023-04-14 DIAGNOSIS — R55 Syncope and collapse: Secondary | ICD-10-CM | POA: Insufficient documentation

## 2023-04-14 DIAGNOSIS — I4819 Other persistent atrial fibrillation: Secondary | ICD-10-CM | POA: Insufficient documentation

## 2023-04-14 DIAGNOSIS — C9110 Chronic lymphocytic leukemia of B-cell type not having achieved remission: Secondary | ICD-10-CM | POA: Diagnosis present

## 2023-04-14 DIAGNOSIS — I4811 Longstanding persistent atrial fibrillation: Secondary | ICD-10-CM | POA: Diagnosis not present

## 2023-04-14 DIAGNOSIS — I5032 Chronic diastolic (congestive) heart failure: Secondary | ICD-10-CM | POA: Diagnosis present

## 2023-04-14 DIAGNOSIS — E669 Obesity, unspecified: Secondary | ICD-10-CM | POA: Diagnosis present

## 2023-04-14 DIAGNOSIS — D6869 Other thrombophilia: Secondary | ICD-10-CM

## 2023-04-14 NOTE — Patient Instructions (Signed)

## 2023-05-15 ENCOUNTER — Other Ambulatory Visit: Payer: Self-pay | Admitting: Cardiovascular Disease

## 2023-05-15 DIAGNOSIS — I4819 Other persistent atrial fibrillation: Secondary | ICD-10-CM

## 2023-05-17 NOTE — Telephone Encounter (Signed)
 Prescription refill request for Eliquis  received. Indication: AF Last office visit: 04/14/23 Scr: 0.88 on 10/29/22  Epic Age: 83 Weight: 96.6kg  Based on above findings Eliquis  5mg  twice daily is the appropriate dose.  Refill approved.

## 2023-05-18 ENCOUNTER — Other Ambulatory Visit: Payer: Self-pay | Admitting: Cardiovascular Disease

## 2023-11-04 ENCOUNTER — Inpatient Hospital Stay: Payer: Medicare Other | Admitting: Hematology and Oncology

## 2023-11-04 ENCOUNTER — Inpatient Hospital Stay: Payer: Medicare Other | Attending: Hematology and Oncology

## 2023-11-04 ENCOUNTER — Other Ambulatory Visit: Payer: Self-pay | Admitting: Hematology and Oncology

## 2023-11-04 DIAGNOSIS — D696 Thrombocytopenia, unspecified: Secondary | ICD-10-CM | POA: Diagnosis not present

## 2023-11-04 DIAGNOSIS — C9110 Chronic lymphocytic leukemia of B-cell type not having achieved remission: Secondary | ICD-10-CM | POA: Insufficient documentation

## 2023-11-04 LAB — CMP (CANCER CENTER ONLY)
ALT: 10 U/L (ref 0–44)
AST: 11 U/L — ABNORMAL LOW (ref 15–41)
Albumin: 4.2 g/dL (ref 3.5–5.0)
Alkaline Phosphatase: 63 U/L (ref 38–126)
Anion gap: 5 (ref 5–15)
BUN: 14 mg/dL (ref 8–23)
CO2: 27 mmol/L (ref 22–32)
Calcium: 9.6 mg/dL (ref 8.9–10.3)
Chloride: 110 mmol/L (ref 98–111)
Creatinine: 0.69 mg/dL (ref 0.44–1.00)
GFR, Estimated: >60 mL/min (ref >60)
Glucose, Bld: 178 mg/dL — ABNORMAL HIGH (ref 70–99)
Potassium: 4.1 mmol/L (ref 3.5–5.1)
Sodium: 142 mmol/L (ref 135–145)
Total Bilirubin: 1.1 mg/dL (ref 0.0–1.2)
Total Protein: 5.8 g/dL — ABNORMAL LOW (ref 6.5–8.1)

## 2023-11-04 LAB — CBC WITH DIFFERENTIAL (CANCER CENTER ONLY)
Abs Immature Granulocytes: 0.01 K/uL (ref 0.00–0.07)
Basophils Absolute: 0 K/uL (ref 0.0–0.1)
Basophils Relative: 0 %
Eosinophils Absolute: 0.1 K/uL (ref 0.0–0.5)
Eosinophils Relative: 2 %
HCT: 43.4 % (ref 36.0–46.0)
Hemoglobin: 14.7 g/dL (ref 12.0–15.0)
Immature Granulocytes: 0 %
Lymphocytes Relative: 60 %
Lymphs Abs: 5.4 K/uL — ABNORMAL HIGH (ref 0.7–4.0)
MCH: 29.2 pg (ref 26.0–34.0)
MCHC: 33.9 g/dL (ref 30.0–36.0)
MCV: 86.1 fL (ref 80.0–100.0)
Monocytes Absolute: 0.6 K/uL (ref 0.1–1.0)
Monocytes Relative: 7 %
Neutro Abs: 2.8 K/uL (ref 1.7–7.7)
Neutrophils Relative %: 31 %
Platelet Count: 97 K/uL — ABNORMAL LOW (ref 150–400)
Platelet Morphology: NORMAL
RBC: 5.04 MIL/uL (ref 3.87–5.11)
RDW: 14.5 % (ref 11.5–15.5)
WBC Count: 9.1 K/uL (ref 4.0–10.5)
nRBC: 0 % (ref 0.0–0.2)

## 2023-11-04 LAB — LACTATE DEHYDROGENASE: LDH: 204 U/L — ABNORMAL HIGH (ref 98–192)

## 2023-11-04 NOTE — Progress Notes (Signed)
 [facility] [phone]   [phone]  PROGRESS NOTE  Patient Care Team: Signa Rush, MD (Inactive) as PCP - General (Internal Medicine) Francyne Headland, MD as PCP - Cardiology (Cardiology) Francyne Headland, MD as Consulting Physician (Cardiology) Georgina Pac, MD as Referring Physician (Internal Medicine)  Hematological/Oncological History # CLL Rai Stage 0 10/10/2020: last visit with Dr. Layla 10/29/2021: establish care with Dr. Federico   Interval History:  Tasha [name] 83 y.o. female with medical history significant for CLL Rai Stage 0 who presents for a follow up visit. The patient's last visit was on 10/29/2022. In the interim since the last visit she has had no major changes in her health.  On exam today Tasha [name] reports has been a mess in the interim since our last visit.  She has been taking care of her husband who has dementia and unfortunately had bladder issues and C. difficile.  Unfortunately he has also been having issues with behavior.  She reports that she is under quite a lot of stress.  She reports her energy levels are okay.  She reports that she does get some occasional sweats with her workouts.  She notes that her appetite is good and sometimes too good.  She knows that she has had no bumps or lumps concerning for lymphadenopathy.  She denies any swelling of her spleen.  She reports that she continues her Eliquis  with some occasional bruising but no bleeding or blood in the urine/stool.  Overall she feels at her baseline level of health and has no questions concerns or complaints today.  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   IFG (impaired fasting glucose)    e   Personal history of other diseases of circulatory system    Tick bite    Unspecified hereditary and idiopathic peripheral neuropathy    Vitamin B12 deficiency    Vitamin D  deficiency     SURGICAL HISTORY: Past Surgical History:  Procedure Laterality Date   ABDOMINAL HYSTERECTOMY  1984   fibroids   BREAST SURGERY  1960s   benign breast tumors   CARDIOVERSION N/A 05/15/2020   Procedure: CARDIOVERSION;  Surgeon: Francyne Headland, MD;  Location: [facility];  Service: Cardiovascular;  Laterality: N/A;   CARDIOVERSION N/A 07/02/2020   Procedure: CARDIOVERSION;  Surgeon: Alveta Aleene PARAS, MD;  Location: [facility];  Service: Cardiovascular;  Laterality: N/A;   CATARACT EXTRACTION W/ INTRAOCULAR LENS  IMPLANT, BILATERAL     LAPAROSCOPIC BILATERAL SALPINGO OOPHERECTOMY  2003   ovarian cyst    SOCIAL HISTORY: Social History   Socioeconomic History   Marital status: Married    Spouse name: Cabin Crew   Number of children: 3   Years of education: college   Highest education level: Not on file  Occupational History    Comment: Retired  Tobacco Use   Smoking status: Never   Smokeless tobacco: Never  Substance and Sexual Activity   Alcohol use: No    Alcohol/week: 0.0 standard drinks of alcohol   Drug use: No   Sexual activity: Not on file  Other Topics Concern   Not on file  Social History  Narrative   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 Patient has college education.    Right handed.   Caffeine- not every day . Soda or tea when she is out for dinner.   Social Drivers of Corporate Investment Banker Strain: Not on file  Food Insecurity: Not on file  Transportation Needs: Not on file  Physical Activity: Not on file  Stress: Not on file  Social Connections: Not on file  Intimate Partner Violence: Not on file    FAMILY HISTORY: Family History  Problem Relation Age of Onset   Hypertension  Mother    Coronary artery disease Father        died at 69   Hypertension Father    Diabetes Father    Coronary artery disease Brother        died at 60   Diabetes Brother    Hypertension Brother    Cancer Daughter     ALLERGIES:  is allergic to clindamycin, celebrex [celecoxib], statins, tape, tricor [fenofibrate], amoxicillin, clarithromycin, latex, and methimazole .  MEDICATIONS:  Current Outpatient Medications  Medication Sig Dispense Refill   acetaminophen  (TYLENOL ) 500 MG tablet Take 500-1,000 mg by mouth 2 (two) times daily as needed (pain.). (Patient not taking: Reported on 04/14/2023)     cyanocobalamin 1000 MCG tablet Take 1,000 mcg by mouth daily.     ELIQUIS  5 MG TABS tablet TAKE 1 TABLET BY MOUTH TWICE DAILY 180 tablet 1   furosemide  (LASIX ) 40 MG tablet Take 1 tablet (40 mg total) by mouth daily. Do not take if your weight is less than 195 lbs 30 tablet 5   metoprolol  tartrate (LOPRESSOR ) 100 MG tablet TAKE 1 TABLET(100 MG) BY MOUTH TWICE DAILY 180 tablet 3   Polyethyl Glycol-Propyl Glycol (SYSTANE) 0.4-0.3 % SOLN Place 1-2 drops into both eyes in the morning and at bedtime.     potassium chloride  SA (KLOR-CON  M20) 20 MEQ tablet Take 1 tablet (20 mEq total) by mouth See admin instructions. Do not take if your weight is less than 195 lbs 30 tablet 5   VITAMIN D , CHOLECALCIFEROL, PO Take 1,000 mcg by mouth daily.     No current facility-administered medications for this visit.    REVIEW OF SYSTEMS:   Constitutional: ( - ) fevers, ( - )  chills , ( - ) night sweats Eyes: ( - ) blurriness of vision, ( - ) double vision, ( - ) watery eyes Ears, nose, mouth, throat, and face: ( - ) mucositis, ( - ) sore throat Respiratory: ( - ) cough, ( - ) dyspnea, ( - ) wheezes Cardiovascular: ( - ) palpitation, ( - ) chest discomfort, ( - ) lower extremity swelling Gastrointestinal:  ( - ) nausea, ( - ) heartburn, ( - ) change in bowel habits Skin: ( - ) abnormal skin rashes Lymphatics:  ( - ) new lymphadenopathy, ( - ) easy bruising Neurological: ( - ) numbness, ( - ) tingling, ( - ) new weaknesses Behavioral/Psych: ( - ) mood change, ( - ) new changes  All other systems were reviewed with the patient and are negative.  PHYSICAL EXAMINATION: ECOG PERFORMANCE STATUS: 0 - Asymptomatic  There were no vitals filed for this visit.   There were no vitals filed for this visit.    GENERAL: Well-appearing elderly Caucasian female, alert, no distress and comfortable SKIN: skin color, texture, turgor are normal, no rashes or significant lesions EYES: conjunctiva are pink and non-injected, sclera clear  NECK: supple, non-tender LYMPH:  no palpable lymphadenopathy in the cervical, axillary or inguinal LUNGS: clear to auscultation and percussion with normal breathing effort HEART: regular rate & rhythm and no murmurs and no lower extremity edema Musculoskeletal: no cyanosis of digits and no clubbing  PSYCH: alert & oriented x 3, fluent speech NEURO: no focal motor/sensory deficits  LABORATORY DATA:  I have reviewed the data as listed    Latest Ref Rng & Units 10/29/2022    9:32 AM 09/24/2022    6:24 PM 10/29/2021    9:58 AM  CBC  WBC 4.0 - 10.5 K/uL 8.4  8.2  7.3   Hemoglobin 12.0 - 15.0 g/dL 84.7  85.2  86.1   Hematocrit 36.0 - 46.0 % 45.0  43.9  41.3   Platelets 150 - 400 K/uL 98  104  181        Latest Ref Rng & Units 10/29/2022    9:32 AM 09/24/2022    6:24 PM 10/29/2021    9:58 AM  CMP  Glucose 70 - 99 mg/dL 874  883  875   BUN 8 - 23 mg/dL 17  14  14    Creatinine 0.44 - 1.00 mg/dL 9.11  9.27  9.31   Sodium 135 - 145 mmol/L 141  141  140   Potassium 3.5 - 5.1 mmol/L 4.6  4.2  4.4   Chloride 98 - 111 mmol/L 104  105  106   CO2 22 - 32 mmol/L 31  25  28    Calcium 8.9 - 10.3 mg/dL 9.7  9.2  9.2   Total Protein 6.5 - 8.1 g/dL 6.3   6.3   Total Bilirubin 0.3 - 1.2 mg/dL 1.2   0.8   Alkaline Phos 38 - 126 U/L 66   68   AST 15 - 41 U/L 12   11   ALT 0 - 44 U/L  11   7     RADIOGRAPHIC STUDIES: No results found.  ASSESSMENT & PLAN Tasha [name] 83 y.o. female with medical history significant for CLL Rai Stage 0 who presents for a follow up visit.   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  ALC 5400 --Platelets are currently under target goal of 100, however hemoglobin remains robust at 14.7.  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 --no evidence of lymphocytosis, disease has been stable x 22 years.  Patient has never required treatment. --patient is currently asymptomatic --RTC in 1 years time.    No orders of the defined types were placed in this encounter.   All questions were answered. The patient knows to call the clinic with any problems, questions or concerns.  A total of more than 25 minutes were spent on this encounter with face-to-face time and non-face-to-face time, including preparing to see the patient, ordering tests and/or medications, counseling the patient and coordination of care as outlined above.   Norleen IVAR Kidney, MD Department of Hematology/Oncology [facility] Phone: [phone] Pager: [phone] Email: [email or web address]  11/04/2023 7:50 AM

## 2023-11-14 ENCOUNTER — Encounter: Payer: Self-pay | Admitting: Oncology

## 2023-11-17 ENCOUNTER — Other Ambulatory Visit: Payer: Self-pay | Admitting: Cardiovascular Disease

## 2023-11-17 DIAGNOSIS — I4819 Other persistent atrial fibrillation: Secondary | ICD-10-CM

## 2023-11-17 NOTE — Telephone Encounter (Signed)
 Prescription refill request for Eliquis  received. Indication:afib Last office visit:3/25 Scr:0.69  10/25 Age: 83 Weight:96.6  kg  Prescription refilled

## 2024-11-03 ENCOUNTER — Inpatient Hospital Stay: Admitting: Hematology and Oncology

## 2024-11-03 ENCOUNTER — Inpatient Hospital Stay
